# Patient Record
Sex: Female | Born: 1985 | Race: White | Hispanic: No | Marital: Single | Smoking: Current every day smoker
Health system: Southern US, Community
[De-identification: ages and names within clinical notes are randomized; demographics above are authoritative.]

## PROBLEM LIST (undated history)

## (undated) DIAGNOSIS — M1990 Unspecified osteoarthritis, unspecified site: Secondary | ICD-10-CM

## (undated) DIAGNOSIS — S62102A Fracture of unspecified carpal bone, left wrist, initial encounter for closed fracture: Secondary | ICD-10-CM

## (undated) DIAGNOSIS — M069 Rheumatoid arthritis, unspecified: Secondary | ICD-10-CM

## (undated) DIAGNOSIS — E781 Pure hyperglyceridemia: Secondary | ICD-10-CM

## (undated) DIAGNOSIS — M545 Low back pain, unspecified: Secondary | ICD-10-CM

## (undated) DIAGNOSIS — F909 Attention-deficit hyperactivity disorder, unspecified type: Secondary | ICD-10-CM

## (undated) DIAGNOSIS — F5104 Psychophysiologic insomnia: Secondary | ICD-10-CM

## (undated) DIAGNOSIS — J9690 Respiratory failure, unspecified, unspecified whether with hypoxia or hypercapnia: Secondary | ICD-10-CM

## (undated) DIAGNOSIS — R87629 Unspecified abnormal cytological findings in specimens from vagina: Secondary | ICD-10-CM

## (undated) DIAGNOSIS — F32A Depression, unspecified: Secondary | ICD-10-CM

## (undated) DIAGNOSIS — E669 Obesity, unspecified: Secondary | ICD-10-CM

## (undated) DIAGNOSIS — R112 Nausea with vomiting, unspecified: Secondary | ICD-10-CM

## (undated) DIAGNOSIS — E063 Autoimmune thyroiditis: Secondary | ICD-10-CM

## (undated) DIAGNOSIS — N200 Calculus of kidney: Secondary | ICD-10-CM

## (undated) DIAGNOSIS — Z9889 Other specified postprocedural states: Secondary | ICD-10-CM

## (undated) DIAGNOSIS — F329 Major depressive disorder, single episode, unspecified: Secondary | ICD-10-CM

## (undated) DIAGNOSIS — E039 Hypothyroidism, unspecified: Secondary | ICD-10-CM

## (undated) DIAGNOSIS — G4700 Insomnia, unspecified: Secondary | ICD-10-CM

## (undated) DIAGNOSIS — S62101A Fracture of unspecified carpal bone, right wrist, initial encounter for closed fracture: Secondary | ICD-10-CM

## (undated) DIAGNOSIS — F410 Panic disorder [episodic paroxysmal anxiety] without agoraphobia: Secondary | ICD-10-CM

## (undated) DIAGNOSIS — Z8489 Family history of other specified conditions: Secondary | ICD-10-CM

## (undated) DIAGNOSIS — O24419 Gestational diabetes mellitus in pregnancy, unspecified control: Secondary | ICD-10-CM

## (undated) HISTORY — DX: Psychophysiologic insomnia: F51.04

## (undated) HISTORY — DX: Panic disorder (episodic paroxysmal anxiety): F41.0

## (undated) HISTORY — DX: Major depressive disorder, single episode, unspecified: F32.9

## (undated) HISTORY — DX: Attention-deficit hyperactivity disorder, unspecified type: F90.9

## (undated) HISTORY — PX: ADENOIDECTOMY: SUR15

## (undated) HISTORY — PX: NOSE SURGERY: SHX723

## (undated) HISTORY — DX: Rheumatoid arthritis, unspecified: M06.9

## (undated) HISTORY — DX: Calculus of kidney: N20.0

## (undated) HISTORY — PX: WRIST FRACTURE SURGERY: SHX121

## (undated) HISTORY — PX: TONSILLECTOMY AND ADENOIDECTOMY: SUR1326

## (undated) HISTORY — DX: Autoimmune thyroiditis: E06.3

## (undated) HISTORY — DX: Unspecified abnormal cytological findings in specimens from vagina: R87.629

## (undated) HISTORY — DX: Depression, unspecified: F32.A

---

## 2003-01-27 ENCOUNTER — Other Ambulatory Visit: Admission: RE | Admit: 2003-01-27 | Discharge: 2003-01-27 | Payer: Self-pay | Admitting: Obstetrics and Gynecology

## 2004-06-05 ENCOUNTER — Emergency Department (HOSPITAL_COMMUNITY): Admission: EM | Admit: 2004-06-05 | Discharge: 2004-06-05 | Payer: Self-pay | Admitting: Family Medicine

## 2004-07-24 ENCOUNTER — Other Ambulatory Visit: Admission: RE | Admit: 2004-07-24 | Discharge: 2004-07-24 | Payer: Self-pay | Admitting: Obstetrics and Gynecology

## 2009-02-06 ENCOUNTER — Observation Stay (HOSPITAL_COMMUNITY): Admission: AC | Admit: 2009-02-06 | Discharge: 2009-02-07 | Payer: Self-pay

## 2009-02-06 DIAGNOSIS — J9690 Respiratory failure, unspecified, unspecified whether with hypoxia or hypercapnia: Secondary | ICD-10-CM

## 2009-02-06 HISTORY — DX: Respiratory failure, unspecified, unspecified whether with hypoxia or hypercapnia: J96.90

## 2009-03-28 HISTORY — PX: ORIF METACARPAL FRACTURE: SUR940

## 2010-03-28 ENCOUNTER — Ambulatory Visit (HOSPITAL_COMMUNITY): Admission: RE | Admit: 2010-03-28 | Discharge: 2010-03-28 | Payer: Self-pay | Admitting: Obstetrics and Gynecology

## 2010-09-07 LAB — CBC
HCT: 45.5 % (ref 36.0–46.0)
Hemoglobin: 15.4 g/dL — ABNORMAL HIGH (ref 12.0–15.0)
MCV: 91.8 fL (ref 78.0–100.0)
WBC: 15.3 10*3/uL — ABNORMAL HIGH (ref 4.0–10.5)

## 2010-09-07 LAB — BASIC METABOLIC PANEL
GFR calc Af Amer: >60 mL/min (ref >60)
GFR calc non Af Amer: >60 mL/min (ref >60)
Potassium: 3.5 mEq/L (ref 3.5–5.1)
Sodium: 140 mEq/L (ref 135–145)

## 2010-09-07 LAB — ABO/RH: ABO/RH(D): O POS

## 2010-09-07 LAB — POCT I-STAT, CHEM 8
Chloride: 108 meq/L (ref 96–112)
HCT: 49 % — ABNORMAL HIGH (ref 36.0–46.0)
Potassium: 3 meq/L — ABNORMAL LOW (ref 3.5–5.1)

## 2010-09-07 LAB — TYPE AND SCREEN: ABO/RH(D): O POS

## 2010-09-07 LAB — ETHANOL: Alcohol, Ethyl (B): 227 mg/dL — ABNORMAL HIGH (ref 0–10)

## 2010-09-07 LAB — PROTIME-INR: INR: 0.9 (ref 0.00–1.49)

## 2010-09-07 LAB — POCT PREGNANCY, URINE: Preg Test, Ur: NEGATIVE

## 2010-09-19 ENCOUNTER — Ambulatory Visit (INDEPENDENT_AMBULATORY_CARE_PROVIDER_SITE_OTHER): Payer: 59

## 2010-09-19 ENCOUNTER — Inpatient Hospital Stay (INDEPENDENT_AMBULATORY_CARE_PROVIDER_SITE_OTHER)
Admission: RE | Admit: 2010-09-19 | Discharge: 2010-09-19 | Disposition: A | Discharge status: Home / Self Care | Payer: 59 | Source: Ambulatory Visit | Attending: Emergency Medicine | Admitting: Emergency Medicine

## 2010-09-19 DIAGNOSIS — S52599A Other fractures of lower end of unspecified radius, initial encounter for closed fracture: Secondary | ICD-10-CM

## 2011-06-12 ENCOUNTER — Other Ambulatory Visit: Payer: Self-pay | Admitting: Family Medicine

## 2011-06-12 ENCOUNTER — Ambulatory Visit
Admission: RE | Admit: 2011-06-12 | Discharge: 2011-06-12 | Disposition: A | Discharge status: Home / Self Care | Payer: 59 | Source: Ambulatory Visit | Attending: Family Medicine | Admitting: Family Medicine

## 2011-06-12 DIAGNOSIS — M549 Dorsalgia, unspecified: Secondary | ICD-10-CM

## 2011-06-12 DIAGNOSIS — M542 Cervicalgia: Secondary | ICD-10-CM

## 2012-08-21 ENCOUNTER — Telehealth: Payer: Self-pay | Admitting: Physician Assistant

## 2012-08-21 DIAGNOSIS — M62838 Other muscle spasm: Secondary | ICD-10-CM

## 2012-08-21 MED ORDER — CYCLOBENZAPRINE HCL 10 MG PO TABS: 10.0000 mg | ORAL_TABLET | Freq: Three times a day (TID) | ORAL | Status: DC | PRN

## 2012-08-21 NOTE — Telephone Encounter (Signed)
Medication refilled per protocol. 

## 2012-09-28 ENCOUNTER — Telehealth: Payer: Self-pay | Admitting: Physician Assistant

## 2012-09-28 DIAGNOSIS — M62838 Other muscle spasm: Secondary | ICD-10-CM

## 2012-09-28 MED ORDER — CYCLOBENZAPRINE HCL 10 MG PO TABS: 10.0000 mg | ORAL_TABLET | Freq: Three times a day (TID) | ORAL | Status: DC | PRN

## 2012-09-28 NOTE — Telephone Encounter (Signed)
Medication refilled per protocol. 

## 2012-11-23 ENCOUNTER — Other Ambulatory Visit: Payer: Self-pay | Admitting: Physician Assistant

## 2012-11-23 NOTE — Telephone Encounter (Signed)
Medication refilled per protocol.Patient needs to be seen before any further refills 

## 2013-01-26 ENCOUNTER — Ambulatory Visit: Payer: Self-pay | Admitting: Family Medicine

## 2013-01-29 ENCOUNTER — Encounter: Payer: Self-pay | Admitting: Family Medicine

## 2013-01-29 ENCOUNTER — Ambulatory Visit (INDEPENDENT_AMBULATORY_CARE_PROVIDER_SITE_OTHER): Payer: 59 | Admitting: Family Medicine

## 2013-01-29 VITALS — BP 130/80 | HR 68 | Temp 97.9°F | Resp 16 | Wt 197.0 lb

## 2013-01-29 DIAGNOSIS — M545 Low back pain, unspecified: Secondary | ICD-10-CM

## 2013-01-29 MED ORDER — NAPROXEN 500 MG PO TABS: 500.0000 mg | ORAL_TABLET | Freq: Two times a day (BID) | ORAL | Status: DC

## 2013-01-29 MED ORDER — TRAMADOL HCL 50 MG PO TABS: 50.0000 mg | ORAL_TABLET | Freq: Four times a day (QID) | ORAL | Status: DC | PRN

## 2013-01-29 MED ORDER — CYCLOBENZAPRINE HCL 10 MG PO TABS: ORAL_TABLET | ORAL | Status: DC

## 2013-01-29 NOTE — Patient Instructions (Signed)
Start naprosyn twice a day Use heating pad Ultram at bedtime Okay to use flexeril Try massage Xray of L spine ,get done at work F/U as needed

## 2013-02-01 ENCOUNTER — Ambulatory Visit (HOSPITAL_COMMUNITY)
Admission: RE | Admit: 2013-02-01 | Discharge: 2013-02-01 | Disposition: A | Discharge status: Home / Self Care | Payer: 59 | Source: Ambulatory Visit | Attending: Family Medicine | Admitting: Family Medicine

## 2013-02-01 ENCOUNTER — Encounter: Payer: Self-pay | Admitting: Family Medicine

## 2013-02-01 DIAGNOSIS — M545 Low back pain, unspecified: Secondary | ICD-10-CM

## 2013-02-01 DIAGNOSIS — M549 Dorsalgia, unspecified: Secondary | ICD-10-CM | POA: Insufficient documentation

## 2013-02-01 HISTORY — DX: Low back pain, unspecified: M54.50

## 2013-02-01 NOTE — Progress Notes (Signed)
  Subjective:    Patient ID: Quenton Fetter, female    DOB: [birth year], 27 y.o.   MRN: [id number]  HPI  Pt here with low back pain worsening for the past month. Has history of neck spasm and uses flexeril for this. 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 Back pain worse at end of day when shift is over. Unable to sleep due to pain. Denies any radiating pain, change in bowel or bladder or paresthesia. Has been taking tylenol and ibuprofen with little relief. Denies UTI symptoms. No specific injury   Review of Systems  GEN- denies fatigue, fever, weight loss,weakness, recent illness ABD- denies N/V, change in stools, abd pain GU- denies dysuria, hematuria, dribbling, incontinence MSK- + joint pain, muscle aches, injury Neuro- denies headache, dizziness, syncope, seizure activity      Objective:   Physical Exam GEN- NAD, alert and oriented x3 Neck- Supple, FROM Back- Mild TTP lumbar spine, +paraspinal spasm, Good ROM, neg SLR, able to walk on toes MSK- Hip- FROM bilat, able to squat  NEURO- CNII-XII intact, sensation and motor in tact bilat, equal bilat, DTR symmetric EXT- No edema Pulses- Radial, DP- 2+        Assessment & Plan:

## 2013-02-01 NOTE — Assessment & Plan Note (Signed)
I think this MSK, with her job and activities, exam benign Continue muscle relaxer Will give Naprosyn BID with food Ultram prn severe pain at bedtime Xray of back to be done Also recommended heating pad, massage

## 2013-03-10 ENCOUNTER — Telehealth: Payer: Self-pay | Admitting: Family Medicine

## 2013-04-23 ENCOUNTER — Ambulatory Visit (INDEPENDENT_AMBULATORY_CARE_PROVIDER_SITE_OTHER): Payer: 59 | Admitting: Family Medicine

## 2013-04-23 VITALS — BP 120/86 | HR 78 | Temp 98.4°F | Resp 18 | Ht 66.0 in | Wt 203.0 lb

## 2013-04-23 DIAGNOSIS — M79609 Pain in unspecified limb: Secondary | ICD-10-CM

## 2013-04-23 DIAGNOSIS — M79671 Pain in right foot: Secondary | ICD-10-CM

## 2013-04-23 MED ORDER — CYCLOBENZAPRINE HCL 10 MG PO TABS: ORAL_TABLET | ORAL | Status: DC

## 2013-04-23 MED ORDER — TRAMADOL HCL 50 MG PO TABS: 50.0000 mg | ORAL_TABLET | Freq: Four times a day (QID) | ORAL | Status: DC | PRN

## 2013-04-23 NOTE — Patient Instructions (Signed)
COntinue current medications Use the naprosyn twice a day F/U as needed

## 2013-04-24 ENCOUNTER — Encounter: Payer: Self-pay | Admitting: Family Medicine

## 2013-04-24 DIAGNOSIS — M79671 Pain in right foot: Secondary | ICD-10-CM

## 2013-04-24 HISTORY — DX: Pain in right foot: M79.671

## 2013-04-24 NOTE — Progress Notes (Signed)
  Subjective:    Patient ID: Sonia Holden, female    DOB: 08/12/1985, 27 y.o.   MRN: [id number]  HPI   patient here with right foot pain for the past week. She states Friday night after working 12 hour shift she began having pain at her metatarsal arch. She denies any specific injury. She had some mild swelling on the top of her foot that resolved quickly. She is walking but has not seen any change due to exercise. 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 She did miss 2 days of work secondary to foot pain. She request refill on pain meds   Review of Systems - per above  GEN- denies fatigue, fever, weight loss,weakness, recent illness MSK- + joint pain, muscle aches, injury Neuro- denies headache, dizziness, syncope, seizure activity       Objective:   Physical Exam  GEN-NAD,alert and oriented x 3 Ext- no edema, pulse- DP 2+ MSK- FROM bilat ankles, normal inspection ankle and feet, RIght foot, mild TTP at metarsal arch, no pain or tenderness at plantar fascia insertion, no heel pain, no bone spur felt, normal GAIT      Assessment & Plan:

## 2013-04-24 NOTE — Assessment & Plan Note (Signed)
It is mostly at her arch. Advised to use arch support. This does not fit plantar fasciitis at this time. She can also take anti-inflammatories as needed. I refilled her medications for her chronic back pain

## 2013-07-07 ENCOUNTER — Telehealth: Payer: Self-pay | Admitting: Family Medicine

## 2013-07-07 MED ORDER — TRAMADOL HCL 50 MG PO TABS: 50.0000 mg | ORAL_TABLET | Freq: Four times a day (QID) | ORAL | Status: DC | PRN

## 2013-07-07 NOTE — Telephone Encounter (Signed)
Ok to refill 

## 2013-07-07 NOTE — Telephone Encounter (Signed)
Okay to refill? 

## 2013-07-07 NOTE — Telephone Encounter (Signed)
Med phoned in °

## 2013-07-07 NOTE — Telephone Encounter (Signed)
Needs Ultram Rx-  She has lost her original written one.  Garza

## 2013-10-21 ENCOUNTER — Other Ambulatory Visit: Payer: Self-pay | Admitting: Family Medicine

## 2013-10-21 NOTE — Telephone Encounter (Signed)
Ok to refill??  Last office visit 04/23/2013.  Last refill 04/26/2013.

## 2014-01-31 ENCOUNTER — Other Ambulatory Visit: Payer: Self-pay | Admitting: Family Medicine

## 2014-01-31 NOTE — Telephone Encounter (Signed)
Ok to refill??  Last office visit 04/23/2013.  Last refill 07/07/2013, #1 refill.

## 2014-01-31 NOTE — Telephone Encounter (Signed)
Medication called to pharmacy.  Letter sent.  

## 2014-01-31 NOTE — Telephone Encounter (Signed)
Okay to refill, needs OV before any further future fills since seen last in Nov

## 2014-03-16 ENCOUNTER — Encounter: Payer: Self-pay | Admitting: Family Medicine

## 2014-03-16 ENCOUNTER — Ambulatory Visit (INDEPENDENT_AMBULATORY_CARE_PROVIDER_SITE_OTHER): Payer: 59 | Admitting: Family Medicine

## 2014-03-16 VITALS — BP 128/68 | HR 82 | Temp 98.2°F | Resp 16 | Ht 65.0 in | Wt 199.0 lb

## 2014-03-16 DIAGNOSIS — F419 Anxiety disorder, unspecified: Secondary | ICD-10-CM

## 2014-03-16 DIAGNOSIS — F5105 Insomnia due to other mental disorder: Secondary | ICD-10-CM

## 2014-03-16 DIAGNOSIS — M545 Low back pain, unspecified: Secondary | ICD-10-CM

## 2014-03-16 DIAGNOSIS — G4700 Insomnia, unspecified: Secondary | ICD-10-CM

## 2014-03-16 DIAGNOSIS — F988 Other specified behavioral and emotional disorders with onset usually occurring in childhood and adolescence: Secondary | ICD-10-CM | POA: Insufficient documentation

## 2014-03-16 DIAGNOSIS — F909 Attention-deficit hyperactivity disorder, unspecified type: Secondary | ICD-10-CM

## 2014-03-16 HISTORY — DX: Anxiety disorder, unspecified: F51.05

## 2014-03-16 HISTORY — DX: Insomnia due to other mental disorder: F41.9

## 2014-03-16 MED ORDER — CYCLOBENZAPRINE HCL 10 MG PO TABS: ORAL_TABLET | ORAL | Status: DC

## 2014-03-16 MED ORDER — DOXEPIN HCL 25 MG PO CAPS: ORAL_CAPSULE | ORAL | Status: DC

## 2014-03-16 MED ORDER — TRAMADOL HCL 50 MG PO TABS: ORAL_TABLET | ORAL | Status: DC

## 2014-03-16 NOTE — Assessment & Plan Note (Signed)
Given doxepin 25 mg at bedtime

## 2014-03-16 NOTE — Assessment & Plan Note (Signed)
Chronic low back pain musculoskeletal. I refilled her Flexeril she also uses tramadol during flares. We discussed weight loss and exercise the tone of her back muscles to prevent these injuries

## 2014-03-16 NOTE — Assessment & Plan Note (Signed)
Continue followup by psychiatrist for her Adderall and her Ambien, xanax

## 2014-03-16 NOTE — Patient Instructions (Signed)
Continue current medications F/U as needed  

## 2014-03-16 NOTE — Progress Notes (Signed)
Patient ID: Sonia Holden, female   DOB: [birth year], 28 y.o.   MRN: [id number]   Subjective:    Patient ID: Sonia Holden, female    DOB: 03/03/1986, 28 y.o.   MRN: [id number]  Patient presents for Medication Refills  patient here for medication refills. She still being followed by her psychiatrist for her ADD a chronic insomnia. She was given doxepin 10 mg however she has been taken 3 capsules she states her psychiatrist is aware but she has not been in to get the prescription changed she is requesting a prescription for higher dose of doxepin.  Regarding her back pain she is a very well with her low back pain x-rays were negative for her degenerative disc disease last year however with her physical work she tends to straining her back every now and then uses Flexeril and tramadol as needed. She's currently remodeling her home there for his required the medication this past weekend    Review Of Systems:  GEN- denies fatigue, fever, weight loss,weakness, recent illness HEENT- denies eye drainage, change in vision, nasal discharge, CVS- denies chest pain, palpitations RESP- denies SOB, cough, wheeze ABD- denies N/V, change in stools, abd pain GU- denies dysuria, hematuria, dribbling, incontinence MSK- denies joint pain, muscle aches, injury Neuro- denies headache, dizziness, syncope, seizure activity       Objective:    BP 128/68  Pulse 82  Temp(Src) 98.2 F (36.8 C) (Oral)  Resp 16  Ht 5\' 5"  (1.651 m)  Wt 199 lb (90.266 kg)  BMI 33.12 kg/m2  LMP 03/11/2014 GEN- NAD, alert and oriented x3 HEENT- PERRL, EOMI, non injected sclera, pink conjunctiva, MMM, oropharynx clear CVS- RRR, no murmur RESP-CTAB Psych- normal affect and mood EXT- No edema Pulses- Radial 2+        Assessment & Plan:      Problem List Items Addressed This Visit   None      Note: This dictation was prepared with Dragon dictation along with smaller phrase technology. Any transcriptional  errors that result from this process are unintentional.

## 2014-06-15 ENCOUNTER — Encounter: Payer: Self-pay | Admitting: Internal Medicine

## 2014-06-15 ENCOUNTER — Encounter (INDEPENDENT_AMBULATORY_CARE_PROVIDER_SITE_OTHER): Payer: Self-pay

## 2014-06-15 ENCOUNTER — Ambulatory Visit (INDEPENDENT_AMBULATORY_CARE_PROVIDER_SITE_OTHER): Payer: 59 | Admitting: Internal Medicine

## 2014-06-15 VITALS — BP 112/76 | HR 93 | Temp 98.1°F | Resp 16 | Ht 67.0 in | Wt 208.5 lb

## 2014-06-15 DIAGNOSIS — G4700 Insomnia, unspecified: Secondary | ICD-10-CM

## 2014-06-15 DIAGNOSIS — Z803 Family history of malignant neoplasm of breast: Secondary | ICD-10-CM

## 2014-06-15 DIAGNOSIS — E669 Obesity, unspecified: Secondary | ICD-10-CM

## 2014-06-15 NOTE — Patient Instructions (Addendum)
Please return for fasting labs at yoru earliest convenience   This is my version of a  "Low GI"  Diet:  It will still lower your blood sugars and allow you to lose 4 to 8  lbs  per month if you follow it carefully.  Your goal with exercise is a minimum of 30 minutes of aerobic exercise 5 days per week (Walking does not count once it becomes easy!)    All of the foods can be found at grocery stores and in bulk at [employer].  The Atkins protein bars and shakes are available in more varieties at Target, [employer] and [employer].     7 AM Breakfast:  Choose from the following:  Low carbohydrate Protein  Shakes (I recommend the EAS AdvantEdge "Carb Control" shakes  Or the low carb shakes by Atkins.    2.5 carbs   Arnold's "Sandwhich Thin"toasted  w/ peanut butter (no jelly: about 20 net carbs  "Bagel Thin" with cream cheese and salmon: about 20 carbs   a scrambled egg/bacon/cheese burrito made with Mission's "carb balance" whole wheat tortilla  (about 10 net carbs )   Avoid cereal and bananas, oatmeal and cream of wheat and grits. They are loaded with carbohydrates!   10 AM: high protein snack  Protein bar by Atkins (the snack size, under 200 cal, usually < 6 net carbs).    A stick of cheese:  Around 1 carb,  100 cal     Dannon Light n Fit [place] Yogurt  (80 cal, 8 carbs)  Other so called "protein bars" and Greek yogurts tend to be loaded with carbohydrates.  Remember, in food advertising, the word "energy" is synonymous for " carbohydrate."  Lunch:   A Sandwich using the bread choices listed, Can use any  Eggs,  lunchmeat, grilled meat or canned tuna), avocado, regular mayo/mustard  and cheese.  A Salad using blue cheese, ranch,  Goddess or vinagrette,  No croutons or "confetti" and no "candied nuts" but regular nuts OK.   No pretzels or chips.  Pickles and miniature sweet peppers are a good low carb alternative that provide a "crunch"  The bread is the only source of carbohydrate in a sandwich  and  can be decreased by trying some of these alternatives to traditional loaf bread  Joseph's makes a pita bread and a flat bread that are 50 cal and 4 net carbs available at [place].  This can be toasted to use with hummous as well  Toufayan makes a low carb flatbread that's 100 cal and 9 net carbs available at [employer] and [employer]J's makes 2 sizes of  Low carb whole wheat tortilla  (The large one is 210 cal and 6 net carbs) Avoid "Low fat dressings, as well as Barry Brunner and [place] dressings They are loaded with sugar!   3 PM/ Mid day  Snack:  Consider  1 ounce of  almonds, walnuts, pistachios, pecans, peanuts,  Macadamia nuts or a nut medley.  Avoid "granola"; the dried cranberries and raisins are loaded with carbohydrates. Mixed nuts as long as there are no raisins,  cranberries or dried fruit.    Try the prosciutto/mozzarella cheese sticks by Fiorruci  In deli /backery section   High protein      6 PM  Dinner:     Meat/fowl/fish with a green salad, and either broccoli, cauliflower, green beans, spinach, brussel sprouts or  Lima beans. DO NOT BREAD THE PROTEIN!!  There is a low carb pasta by Dreamfield's that is acceptable and tastes great: only 5 digestible carbs/serving.( All grocery stores but BJs carry it )  Try Hurley Cisco Angelo's chicken piccata or chicken or eggplant parm over low carb pasta.(Lowes and BJs)   Marjory Lies Sanchez's "Carnitas" (pulled pork, no sauce,  0 carbs) or his beef pot roast to make a dinner burrito (at BJ's)  Pesto over low carb pasta (bj's sells a good quality pesto in the center refrigerated section of the deli   Try satueeing  Cheral Marker with mushroooms  Whole wheat pasta is still full of digestible carbs and  Not as low in glycemic index as Dreamfield's.   Brown rice is still rice,  So skip the rice and noodles if you eat [place] or [place] (or at least limit to 1/2 cup)  9 PM snack :   Breyer's "low carb" fudgsicle or  ice cream bar (Carb  Smart line), or  Weight Watcher's ice cream bar , or another "no sugar added" ice cream;  a serving of fresh berries/cherries with whipped cream   Cheese or DANNON'S LlGHT N FIT GREEK YOGURT  8 ounces of [place] unsweetened almond/cococunut milk    Avoid bananas, pineapple, grapes  and watermelon on a regular basis because they are high in sugar.  THINK OF THEM AS DESSERT  Remember that snack Substitutions should be less than 10 NET carbs per serving and meals < 20 carbs. Remember to subtract fiber grams to get the "net carbs."

## 2014-06-15 NOTE — Progress Notes (Signed)
Patient ID: Sonia Holden, female   DOB: 11-19-1985, 29 y.o.   MRN: [id number]   Patient Active Problem List   Diagnosis Date Noted  . Obesity 06/18/2014  . Family history of breast cancer in first degree relative 06/18/2014  . Insomnia 03/16/2014  . ADD (attention deficit disorder) 03/16/2014  . Right foot pain 04/24/2013  . Lumbar back pain 02/01/2013    Subjective:  CC:   Chief Complaint  Patient presents with  . Establish Care    weight concerns wants to lose weight.    HPI:   Sonia Holden Faucetteis a 29 y.o. female who presents to establish primary care.  She has multiple  Chronic issues:  1)  Obesity.  Her heaviest weight has  Been 210 lbs to date.  Her nadir was 135 lbs .  Prior trial of phentermine was helpful but caused increased irritability and anger , and hse is now treated with adderall for ADD  She is not following a diet yet,  And is not involved in a formal exercise regimen because she is renovating her house in her spare time .  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   2) Chronic insomnia: managed with ambien and doxepin  For over a year.    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 No kids.  Currently in a relationship   Medical HX  history of concussion and fractured left hand requiring surgical fixation by Gramig,  2011    Past Medical History  Diagnosis Date  . ADHD (attention deficit hyperactivity disorder)   . Panic attacks   . Chronic insomnia   . Depression    No Known Allergies   Past Surgical History  Procedure Laterality Date  . Adenoidectomy    . Nose surgery      History   Social History  . Marital Status: Single    Spouse Name: N/A    Number of Children: N/A  . Years of Education: N/A   Occupational History  . Not on file.   Social History Main Topics  . Smoking status: Former Smoker -- .5 years    Quit date: 03/15/2014  . Smokeless tobacco: Never Used  . Alcohol Use: 0.0  oz/week    0 Not specified per week     Comment: occassionally   . Drug Use: No  . Sexual Activity: Yes   Other Topics Concern  . Not on file   Social History Narrative   Family History  Problem Relation Age of Onset  . Cancer Mother     breast  . Depression Mother   . Kidney disease Father   . Hyperlipidemia Father   . Arthritis Father        Review of Systems:   The rest of the review of systems was negative except those addressed in the HPI.      Objective:  BP 112/76 mmHg  Pulse 93  Temp(Src) 98.1 F (36.7 C) (Oral)  Resp 16  Ht $R'5\' 7"'HQ$  (1.702 m)  Wt 208 lb 8 oz (94.575 kg)  BMI 32.65 kg/m2  SpO2 98%  LMP 06/05/2014 (Approximate)  General appearance: alert, cooperative and appears stated age Ears: normal TM's and external ear canals both ears Throat: lips, mucosa, and tongue normal; teeth and gums normal Neck: no adenopathy, no carotid bruit, supple, symmetrical, trachea midline and thyroid not enlarged, symmetric, no tenderness/mass/nodules Back: symmetric,  no curvature. ROM normal. No CVA tenderness. Lungs: clear to auscultation bilaterally Heart: regular rate and rhythm, S1, S2 normal, no murmur, click, rub or gallop Abdomen: soft, non-tender; bowel sounds normal; no masses,  no organomegaly Pulses: 2+ and symmetric Skin: Skin color, texture, turgor normal. No rashes or lesions Lymph nodes: Cervical, supraclavicular, and axillary nodes normal.  Assessment and Plan:  Obesity I have addressed  BMI and recommended wt loss of 10% of body weigh over the next 6 months using a low glycemic index diet and regular exercise a minimum of 5 days per week. Phentermine C/i due to prior adverse reaction and concurrent use of Adderall.  Information on belviq given.  Screening for metabolic disrorders advised.     Family history of breast cancer in first degree relative She has a strong FH of breast cancer (mother and maternal aunt) but her mothers BRCA carriage  is unknown; therefore it is unclear whether screening for her should start now and whether breast MRI is warranted.  Will refer patient for genetic testing if she is agreeable.    Insomnia Managed with ambien and doxepin,  No changes today     Updated Medication List Outpatient Encounter Prescriptions as of 06/15/2014  Medication Sig  . ALPRAZolam (XANAX) 1 MG tablet Take 1 mg by mouth 2 (two) times daily as needed for sleep.   Marland Kitchen amphetamine-dextroamphetamine (ADDERALL) 10 MG tablet Take 10 mg by mouth 2 (two) times daily.  . cyclobenzaprine (FLEXERIL) 10 MG tablet TAKE 1 TABLET BY MOUTH 3 TIMES DAILY AS NEEDED FOR MUSCLE SPASMS.  Marland Kitchen doxepin (SINEQUAN) 25 MG capsule 1 capsule at bedtime  . Multiple Vitamin (MULTIVITAMIN) tablet Take 1 tablet by mouth daily.  . naproxen (NAPROSYN) 500 MG tablet Take 1 tablet (500 mg total) by mouth 2 (two) times daily with a meal.  . traMADol (ULTRAM) 50 MG tablet TAKE 1 TABLET BY MOUTH EVERY 6 HOURS AS NEEDED FOR PAIN  . valACYclovir (VALTREX) 500 MG tablet Take 500 mg by mouth 2 (two) times daily.  Marland Kitchen zolpidem (AMBIEN CR) 12.5 MG CR tablet Take 1 tablet by mouth at bedtime as needed.  . [DISCONTINUED] zolpidem (AMBIEN) 10 MG tablet Take 10 mg by mouth at bedtime as needed for sleep.     Orders Placed This Encounter  Procedures  . Comprehensive metabolic panel  . Lipid panel  . Hemoglobin A1c  . TSH    Return in about 2 days (around 06/17/2014).

## 2014-06-15 NOTE — Progress Notes (Signed)
Pre-visit discussion using our clinic review tool. No additional management support is needed unless otherwise documented below in the visit note.  

## 2014-06-18 ENCOUNTER — Encounter: Payer: Self-pay | Admitting: Internal Medicine

## 2014-06-18 DIAGNOSIS — E669 Obesity, unspecified: Secondary | ICD-10-CM

## 2014-06-18 DIAGNOSIS — Z803 Family history of malignant neoplasm of breast: Secondary | ICD-10-CM | POA: Insufficient documentation

## 2014-06-18 HISTORY — DX: Obesity, unspecified: E66.9

## 2014-06-18 NOTE — Assessment & Plan Note (Signed)
She has a strong FH of breast cancer (mother and maternal aunt) but her mothers BRCA carriage is unknown; therefore it is unclear whether screening for her should start now and whether breast MRI is warranted.  Will refer patient for genetic testing if she is agreeable.

## 2014-06-18 NOTE — Assessment & Plan Note (Signed)
Managed with ambien and doxepin,  No changes today 

## 2014-06-18 NOTE — Assessment & Plan Note (Addendum)
I have addressed  BMI and recommended wt loss of 10% of body weigh over the next 6 months using a low glycemic index diet and regular exercise a minimum of 5 days per week. Phentermine C/i due to prior adverse reaction and concurrent use of Adderall.  Information on belviq given.  Screening for metabolic disrorders advised.

## 2014-06-23 ENCOUNTER — Other Ambulatory Visit: Payer: 59

## 2014-06-30 ENCOUNTER — Telehealth: Payer: Self-pay | Admitting: *Deleted

## 2014-06-30 ENCOUNTER — Other Ambulatory Visit (INDEPENDENT_AMBULATORY_CARE_PROVIDER_SITE_OTHER): Payer: 59

## 2014-06-30 DIAGNOSIS — E669 Obesity, unspecified: Secondary | ICD-10-CM

## 2014-06-30 LAB — TSH: TSH: 1.66 u[IU]/mL (ref 0.35–4.50)

## 2014-06-30 LAB — COMPREHENSIVE METABOLIC PANEL
ALK PHOS: 61 U/L (ref 39–117)
ALT: 18 U/L (ref 0–35)
AST: 12 U/L (ref 0–37)
Albumin: 4.3 g/dL (ref 3.5–5.2)
BUN: 10 mg/dL (ref 6–23)
CHLORIDE: 107 meq/L (ref 96–112)
CO2: 22 meq/L (ref 19–32)
CREATININE: 0.49 mg/dL (ref 0.40–1.20)
Calcium: 9 mg/dL (ref 8.4–10.5)
GFR: 156.96 mL/min (ref >60.00)
Glucose, Bld: 87 mg/dL (ref 70–99)
Potassium: 4.6 mEq/L (ref 3.5–5.1)
SODIUM: 139 meq/L (ref 135–145)
Total Bilirubin: 0.3 mg/dL (ref 0.2–1.2)
Total Protein: 6.8 g/dL (ref 6.0–8.3)

## 2014-06-30 LAB — LIPID PANEL
Cholesterol: 178 mg/dL (ref 0–200)
HDL: 49.1 mg/dL
LDL Cholesterol: 102 mg/dL — ABNORMAL HIGH (ref 0–99)
NonHDL: 128.9
Total CHOL/HDL Ratio: 4
Triglycerides: 134 mg/dL (ref 0.0–149.0)
VLDL: 26.8 mg/dL (ref 0.0–40.0)

## 2014-06-30 LAB — HEMOGLOBIN A1C: HEMOGLOBIN A1C: 5.4 % (ref 4.6–6.5)

## 2014-06-30 MED ORDER — LORCASERIN HCL 10 MG PO TABS: 1.0000 | ORAL_TABLET | Freq: Two times a day (BID) | ORAL | Status: DC

## 2014-06-30 NOTE — Telephone Encounter (Signed)
rx printed,  Return in 3 months,   Please remind patient that to continue medication her Minimum wt loss goal is 11 lbs by that time.

## 2014-06-30 NOTE — Telephone Encounter (Signed)
Pt came in for labs and said that you told her that when she was ready to try the [facility] that you would send her a RX, she is wanting to try it now

## 2014-06-30 NOTE — Telephone Encounter (Signed)
Pt notified,  verbalized understanding. 3 month follow up appt scheduled. Rx faxed to [facility]

## 2014-08-10 ENCOUNTER — Telehealth: Payer: Self-pay

## 2014-08-10 MED ORDER — CYCLOBENZAPRINE HCL 10 MG PO TABS: ORAL_TABLET | ORAL | Status: DC

## 2014-08-10 MED ORDER — TRAMADOL HCL 50 MG PO TABS: ORAL_TABLET | ORAL | Status: DC

## 2014-08-10 NOTE — Telephone Encounter (Signed)
Patient stated you have her currently taking Belviq for weight loss but that if the Belviq did not work she could try something else? Plus earlier requesting refill on tramadol and flexeril Ok to fill?

## 2014-08-10 NOTE — Telephone Encounter (Signed)
The patient called and stated she needs a refill on her tramadol and flexeril rx. Thanks!

## 2014-08-10 NOTE — Telephone Encounter (Signed)
We may have discussed Contrava (wellbutrin/naltrexone) but before we made a switch can she be more specific about the Belviiq/ ? What is an intolerance for side effecgts or did it not suppress her appetite?  refll on the tramadol and flexeril in process

## 2014-08-10 NOTE — Telephone Encounter (Signed)
Last refills 03/16/14 ok to fill?

## 2014-08-11 ENCOUNTER — Encounter: Payer: Self-pay | Admitting: *Deleted

## 2014-08-11 ENCOUNTER — Other Ambulatory Visit: Payer: Self-pay | Admitting: *Deleted

## 2014-08-11 MED ORDER — PHENTERMINE HCL 37.5 MG PO TABS: ORAL_TABLET | ORAL | Status: DC

## 2014-08-11 NOTE — Telephone Encounter (Signed)
Patient stated that the discussion was on use of phentermine that she had used in the past for weight loss, the belviq does not curb her appetitive, only helped for about a week.  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 Please advise.

## 2014-08-11 NOTE — Telephone Encounter (Signed)
Phentermine authorized and rx pinted

## 2014-08-11 NOTE — Telephone Encounter (Signed)
Left message for patient to return call to office. 

## 2014-08-12 NOTE — Telephone Encounter (Signed)
Patient notified and voiced understanding.

## 2014-09-27 ENCOUNTER — Ambulatory Visit: Payer: 59 | Admitting: Internal Medicine

## 2014-09-28 ENCOUNTER — Other Ambulatory Visit: Payer: Self-pay | Admitting: Internal Medicine

## 2014-09-28 NOTE — Telephone Encounter (Signed)
Patient requesting refill on doxepin ok to fill? Last visit 06/15/14

## 2014-09-29 MED ORDER — DOXEPIN HCL 25 MG PO CAPS: ORAL_CAPSULE | ORAL | Status: DC

## 2014-09-29 NOTE — Telephone Encounter (Signed)
90 day supply authorized and sent   

## 2014-11-15 ENCOUNTER — Other Ambulatory Visit: Payer: Self-pay | Admitting: *Deleted

## 2014-11-15 MED ORDER — CYCLOBENZAPRINE HCL 10 MG PO TABS: ORAL_TABLET | ORAL | Status: DC

## 2014-11-15 NOTE — Telephone Encounter (Signed)
Ok refill? Last OV 06/15/14

## 2015-03-17 ENCOUNTER — Other Ambulatory Visit: Payer: Self-pay

## 2015-03-17 MED ORDER — CYCLOBENZAPRINE HCL 10 MG PO TABS: ORAL_TABLET | ORAL | Status: DC

## 2015-03-17 NOTE — Telephone Encounter (Signed)
Ok to refill,  Refill sent  

## 2015-03-17 NOTE — Telephone Encounter (Signed)
Please advise refill as patient has not been seen since 06/15/2014

## 2015-06-13 DIAGNOSIS — F329 Major depressive disorder, single episode, unspecified: Secondary | ICD-10-CM | POA: Diagnosis not present

## 2015-06-13 DIAGNOSIS — F4000 Agoraphobia, unspecified: Secondary | ICD-10-CM | POA: Diagnosis not present

## 2015-06-13 DIAGNOSIS — F919 Conduct disorder, unspecified: Secondary | ICD-10-CM | POA: Diagnosis not present

## 2015-06-21 DIAGNOSIS — R87610 Atypical squamous cells of undetermined significance on cytologic smear of cervix (ASC-US): Secondary | ICD-10-CM | POA: Diagnosis not present

## 2015-06-21 DIAGNOSIS — N870 Mild cervical dysplasia: Secondary | ICD-10-CM | POA: Diagnosis not present

## 2015-06-27 MED FILL — CYCLOBENZAPRINE 10 MG TAB: 10 | 15 days supply | Qty: 45 | Fill #2

## 2015-07-12 ENCOUNTER — Other Ambulatory Visit: Payer: Self-pay

## 2015-07-12 NOTE — Telephone Encounter (Signed)
Please advise, Last OV was 06/15/2014.

## 2015-07-13 NOTE — Telephone Encounter (Signed)
Refills denied.  Needs to have OV

## 2015-07-19 ENCOUNTER — Other Ambulatory Visit: Payer: Self-pay | Admitting: Internal Medicine

## 2015-07-19 NOTE — Telephone Encounter (Signed)
Patient needs a OV for any refills.  We have sent that to the pharmacy numerous times in the past week.  Can you please schedule visit with Dr. Derrel Nip.  Thanks

## 2015-07-19 NOTE — Telephone Encounter (Signed)
Pt called about needing a refill for traMADol (ULTRAM) 50 MG tablet and doxepin (SINEQUAN) 25 MG capsule. 23month supply. Pharmacy is [facility] - [place], [place]. Call pt @ [phone]. Pt has an appt scheduled for 07/24/2015 @ 6pm. Thank you!

## 2015-07-20 MED ORDER — TRAMADOL HCL 50 MG PO TABS: ORAL_TABLET | ORAL | Status: DC

## 2015-07-20 MED ORDER — DOXEPIN HCL 25 MG PO CAPS: ORAL_CAPSULE | ORAL | Status: DC

## 2015-07-20 MED FILL — DOXEPIN 25 MG CAPSULE: 25 | 90 days supply | Qty: 90 | Fill #0

## 2015-07-20 NOTE — Telephone Encounter (Signed)
Patient has OV scheduled now, please advise for refill. Thanks

## 2015-07-20 NOTE — Telephone Encounter (Signed)
No,  i will refill .

## 2015-07-20 NOTE — Telephone Encounter (Signed)
Good morning!!  Sonia Holden pt has a OV on 07/24/2015 @6pm . Does she need to come to visit first before refill?

## 2015-07-21 MED FILL — traMADol HCL 50 MG TABS: 50 | 8 days supply | Qty: 30 | Fill #0

## 2015-07-24 ENCOUNTER — Ambulatory Visit (INDEPENDENT_AMBULATORY_CARE_PROVIDER_SITE_OTHER): Payer: 59 | Admitting: Internal Medicine

## 2015-07-24 ENCOUNTER — Encounter: Payer: Self-pay | Admitting: Internal Medicine

## 2015-07-24 VITALS — BP 118/76 | HR 112 | Temp 97.8°F | Resp 14 | Ht 67.0 in | Wt 229.2 lb

## 2015-07-24 DIAGNOSIS — M545 Low back pain, unspecified: Secondary | ICD-10-CM

## 2015-07-24 DIAGNOSIS — E669 Obesity, unspecified: Secondary | ICD-10-CM | POA: Diagnosis not present

## 2015-07-24 DIAGNOSIS — M62830 Muscle spasm of back: Secondary | ICD-10-CM

## 2015-07-24 DIAGNOSIS — G4700 Insomnia, unspecified: Secondary | ICD-10-CM | POA: Diagnosis not present

## 2015-07-24 MED ORDER — SUVOREXANT 20 MG PO TABS: 1.0000 | ORAL_TABLET | Freq: Every day | ORAL | Status: DC

## 2015-07-24 MED ORDER — ALPRAZOLAM 1 MG PO TABS: 1.0000 mg | ORAL_TABLET | Freq: Two times a day (BID) | ORAL | Status: DC | PRN

## 2015-07-24 MED ORDER — CYCLOBENZAPRINE HCL 10 MG PO TABS: ORAL_TABLET | ORAL | Status: DC

## 2015-07-24 NOTE — Progress Notes (Signed)
Pre visit review using our clinic review tool, if applicable. No additional management support is needed unless otherwise documented below in the visit note. 

## 2015-07-24 NOTE — Patient Instructions (Signed)
This is my  example of a  "Low GI"  Diet:  It will allow you to lose 4 to 8  lbs  per month if you follow it carefully.  Your goal with exercise is a minimum of 30 minutes of aerobic exercise 5 days per week (Walking does not count once it becomes easy!)    All of the foods can be found at grocery stores and in bulk at [employer].  The Atkins protein bars and shakes are available in more varieties at [employer], [employer] and [place].     7 AM Breakfast:  Choose from the following:  Low carbohydrate Protein  Shakes (I recommend the  Premier Protein chocolate shake, s EAS AdvantEdge "Carb Control" shakes  Or the low carb shakes by Atkins.    2.5 carbs)   Arnold's "Sandwhich Thin"toasted  w/ peanut butter (no jelly: about 20 net carbs  "Bagel Thin" with cream cheese and salmon: about 20 carbs   a scrambled egg/bacon/cheese burrito made with Mission's "carb balance" whole wheat tortilla  (about 10 net carbs )  Regulatory affairs officer (basically a quiche without the pastry crust) that is eaten cold and very convenient way to get your eggs  If you make your own shakes, avoid bananas and pineapple,  And use low carb greek yogurt or almond milk    Avoid cereal and bananas, oatmeal and cream of wheat and grits. They are loaded with carbohydrates!   10 AM: high protein snack:  Protein bar by Atkins (the snack size, under 200 cal, usually < 6 net carbs).    A stick of cheese:  Around 1 carb,  100 cal     Dannon Light n Fit [place] Yogurt  (80 cal, 8 carbs)  Other so called "protein bars" and Greek yogurts tend to be loaded with carbohydrates.  Remember, in food advertising, the word "energy" is synonymous for " carbohydrate."  Lunch:   A Sandwich using the bread choices listed, Can use any  Eggs,  lunchmeat, grilled meat or canned tuna), avocado, regular mayo/mustard  and cheese.  A Salad using blue cheese, ranch,  Goddess or vinagrette,  Avoid taco shells, croutons or "confetti" and no  "candied nuts" but regular nuts OK.   No pretzels, nabs  or chips.  Pickles and miniature sweet peppers are a good low carb alternative that provide a "crunch"  The bread is the only source of carbohydrate in a sandwich and  can be decreased by trying some of these alternatives to traditional loaf bread  Joseph's makes a pita bread and a flat bread that are 50 cal and 4 net carbs available at [place] and [place].  This can be toasted to use with hummous as well  Toufayan makes a low carb flatbread that's 100 cal and 9 net carbs available at [employer] and [employer]J's makes 2 sizes of  Low carb whole wheat tortilla  (The large one is 210 cal and 6 net carbs)  Ezekiel bread is a loaf bread sold in the frozen section of higher end grocery chains,  Very low carb  Avoid "Low fat dressings, as well as Barry Brunner and [place] dressings They are loaded with sugar!   3 PM/ Mid day  Snack:  Consider  1 ounce of  almonds, walnuts, pistachios, pecans, peanuts,  Macadamia nuts or a nut medley.  Avoid "granola"; the dried cranberries and raisins are loaded with carbohydrates. Mixed nuts as long as there  are no raisins,  cranberries or dried fruit.    Try the prosciutto/mozzarella cheese sticks by Fiorruci  In deli /backery section   High protein      6 PM  Dinner:     Meat/fowl/fish with a green salad, and either broccoli, cauliflower, green beans, spinach, brussel sprouts or  Lima beans. DO NOT BREAD THE PROTEIN!!      There is a low carb pasta by Dreamfield's that is acceptable and tastes great: only 5 digestible carbs/serving.( All grocery stores but BJs carry it )  Try Hurley Cisco Angelo's chicken piccata or chicken or eggplant parm over low carb pasta.(Lowes and BJs)   Marjory Lies Sanchez's "Carnitas" (pulled pork, no sauce,  0 carbs) or his beef pot roast to make a dinner burrito (at BJ's)  Pesto over low carb pasta (bj's sells a good quality pesto in the center refrigerated section of the deli   Try satueeing   Cheral Marker with mushroooms, garlic and ginger  Whole wheat pasta is still full of digestible carbs and  Not as low in glycemic index as Dreamfield's.   Brown rice is still rice,  So skip the rice and noodles if you eat [place] or [place] (or at least limit to 1/2 cup)  9 PM snack :   Breyer's "low carb" fudgsicle or  ice cream bar (Carb Smart line), or  Weight Watcher's ice cream bar , or another "no sugar added" ice cream;  a serving of fresh berries/cherries with whipped cream   Cheese or DANNON'S LlGHT N FIT GREEK YOGURT  8 ounces of [place] unsweetened almond/cococunut milk    Treat yourself to a parfait made with whipped cream blueberiies, walnuts and vanilla greek yogurt  Avoid bananas, pineapple, grapes  and watermelon on a regular basis because they are high in sugar.  THINK OF THEM AS DESSERT  Remember that snack Substitutions should be less than 10 NET carbs per serving and meals < 20 carbs. Remember to subtract fiber grams to get the "net carbs."

## 2015-07-24 NOTE — Progress Notes (Signed)
Subjective:  Patient ID: Sonia Holden, female    DOB: Aug 01, 1985  Age: 30 y.o. MRN: [id number]  CC: The primary encounter diagnosis was Muscle spasm of back. Diagnoses of Obesity, Insomnia, and Bilateral low back pain without sciatica were also pertinent to this visit.  HPI Sonia Holden presents for follow up on on chronic conditions,  , last seen January 2016.   chronic insomnia managed with ambien and doxepin .  Her psychiatrist has added Belsomra. She is in the process of transferring to another psychiatrist due to insurance  Changes,  And is requesting a refill fro one month until she can get in to see her new provider.   Obesity:  Weight gain since adding Implanon of 21 lbs.  Wants to resume the low GI diet. Wants  to use the [facility] for exercise and massage   Outpatient Prescriptions Prior to Visit  Medication Sig Dispense Refill  . amphetamine-dextroamphetamine (ADDERALL) 10 MG tablet Take 10 mg by mouth 2 (two) times daily.    Marland Kitchen doxepin (SINEQUAN) 25 MG capsule 1 capsule at bedtime 90 capsule 1  . Multiple Vitamin (MULTIVITAMIN) tablet Take 1 tablet by mouth daily.    . naproxen (NAPROSYN) 500 MG tablet Take 1 tablet (500 mg total) by mouth 2 (two) times daily with a meal. 60 tablet 2  . traMADol (ULTRAM) 50 MG tablet TAKE 1 TABLET BY MOUTH EVERY 6 HOURS AS NEEDED FOR PAIN 30 tablet 2  . valACYclovir (VALTREX) 500 MG tablet Take 500 mg by mouth 2 (two) times daily.    Marland Kitchen zolpidem (AMBIEN CR) 12.5 MG CR tablet Take 1 tablet by mouth at bedtime as needed.  5  . ALPRAZolam (XANAX) 1 MG tablet Take 1 mg by mouth 2 (two) times daily as needed for sleep.     . cyclobenzaprine (FLEXERIL) 10 MG tablet TAKE 1 TABLET BY MOUTH 3 TIMES DAILY AS NEEDED FOR MUSCLE SPASMS. 45 tablet 2  . phentermine (ADIPEX-P) 37.5 MG tablet 1/2 tablet in the am and early afternoon (Patient not taking: Reported on 07/24/2015) 30 tablet 2   No facility-administered medications prior to  visit.    Review of Systems;  Patient denies headache, fevers, malaise, unintentional weight loss, skin rash, eye pain, sinus congestion and sinus pain, sore throat, dysphagia,  hemoptysis , cough, dyspnea, wheezing, chest pain, palpitations, orthopnea, edema, abdominal pain, nausea, melena, diarrhea, constipation, flank pain, dysuria, hematuria, urinary  Frequency, nocturia, numbness, tingling, seizures,  Focal weakness, Loss of consciousness,  Tremor,  depression, anxiety, and suicidal ideation.      Objective:  BP 118/76 mmHg  Pulse 112  Temp(Src) 97.8 F (36.6 C) (Oral)  Resp 14  Ht 5\' 7"  (1.702 m)  Wt 229 lb 3.2 oz (103.964 kg)  BMI 35.89 kg/m2  SpO2 98%  LMP 07/10/2015  BP Readings from Last 3 Encounters:  07/24/15 118/76  06/15/14 112/76  03/16/14 128/68    Wt Readings from Last 3 Encounters:  07/24/15 229 lb 3.2 oz (103.964 kg)  06/15/14 208 lb 8 oz (94.575 kg)  03/16/14 199 lb (90.266 kg)    General appearance: alert, cooperative and appears stated age Ears: normal TM's and external ear canals both ears Throat: lips, mucosa, and tongue normal; teeth and gums normal Neck: no adenopathy, no carotid bruit, supple, symmetrical, trachea midline and thyroid not enlarged, symmetric, no tenderness/mass/nodules Back: symmetric, no curvature. ROM normal. No CVA tenderness. Lungs: clear to auscultation bilaterally Heart: regular rate and rhythm,  S1, S2 normal, no murmur, click, rub or gallop Abdomen: soft, non-tender; bowel sounds normal; no masses,  no organomegaly Pulses: 2+ and symmetric Skin: Skin color, texture, turgor normal. No rashes or lesions Lymph nodes: Cervical, supraclavicular, and axillary nodes normal.  Lab Results  Component Value Date   HGBA1C 5.4 06/30/2014    Lab Results  Component Value Date   CREATININE 0.49 06/30/2014   CREATININE 0.52 02/07/2009   CREATININE 0.7 02/06/2009    Lab Results  Component Value Date   WBC 15.3* 02/06/2009    HGB * 02/06/2009    16.7 QA FLAGS MODIFIED BY DEMOGRAPHIC UPDATE ON 09/06 AT 0344   HCT * 02/06/2009    49.0 QA FLAGS MODIFIED BY DEMOGRAPHIC UPDATE ON 09/06 AT 0344   PLT 269 02/06/2009   GLUCOSE 87 06/30/2014   CHOL 178 06/30/2014   TRIG 134.0 06/30/2014   HDL 49.10 06/30/2014   LDLCALC 102* 06/30/2014   ALT 18 06/30/2014   AST 12 06/30/2014   NA 139 06/30/2014   K 4.6 06/30/2014   CL 107 06/30/2014   CREATININE 0.49 06/30/2014   BUN 10 06/30/2014   CO2 22 06/30/2014   TSH 1.66 06/30/2014   INR 0.9 02/06/2009   HGBA1C 5.4 06/30/2014    Dg Lumbar Spine Complete  02/01/2013  *RADIOLOGY REPORT* Clinical Data: Back pain.  No injury. LUMBAR SPINE - COMPLETE 4+ VIEW Comparison: None. Findings: Normal alignment.  No fracture.  No spondylolysis.  No significant degenerative changes. IMPRESSION: Negative lumbar spine study. Original Report Authenticated By: Jorje Guild    Assessment & Plan:   Problem List Items Addressed This Visit    Lumbar back pain    Managed with massage therapy.  Order written for weekly massage       Relevant Medications   cyclobenzaprine (FLEXERIL) 10 MG tablet   Insomnia    Managed with ambein, sinequan and Belsomra.       Obesity    I have addressed  BMI and recommended a low glycemic index diet utilizing smaller more frequent meals to increase metabolism.  I have also recommended that patient start exercising with a goal of 30 minutes of aerobic exercise a minimum of 5 days per week.          Other Visit Diagnoses    Muscle spasm of back    -  Primary    Relevant Orders    Ambulatory referral to Physical Therapy    Ambulatory referral to Physical Therapy       I have discontinued Sonia Holden's phentermine. I have also changed her ALPRAZolam. Additionally, I am having her start on Suvorexant. Lastly, I am having her maintain her amphetamine-dextroamphetamine, multivitamin, naproxen, valACYclovir, zolpidem, doxepin, traMADol, and  cyclobenzaprine.  Meds ordered this encounter  Medications  . ALPRAZolam (XANAX) 1 MG tablet    Sig: Take 1 tablet (1 mg total) by mouth 2 (two) times daily as needed for sleep.    Dispense:  60 tablet    Refill:  0  . cyclobenzaprine (FLEXERIL) 10 MG tablet    Sig: TAKE 1 TABLET BY MOUTH 3 TIMES DAILY AS NEEDED FOR MUSCLE SPASMS.    Dispense:  90 tablet    Refill:  2  . Suvorexant (BELSOMRA) 20 MG TABS    Sig: Take 1 tablet by mouth at bedtime.    Dispense:  30 tablet    Refill:  0    Medications Discontinued During This Encounter  Medication Reason  .  ALPRAZolam (XANAX) 1 MG tablet Reorder  . cyclobenzaprine (FLEXERIL) 10 MG tablet Reorder  . phentermine (ADIPEX-P) 37.5 MG tablet     Follow-up: Return in about 3 months (around 10/21/2015) for fasting labs prior .   Crecencio Mc, MD

## 2015-07-25 MED FILL — ZOLPIDEM TART ER 12.5 MG TA: 12.5 | 30 days supply | Qty: 30 | Fill #0

## 2015-07-25 MED FILL — CYCLOBENZAPRINE 10 MG TAB: 10 | 30 days supply | Qty: 90 | Fill #0

## 2015-07-25 MED FILL — ALPRAZolam 1 MG TABS: 1 | 30 days supply | Qty: 60 | Fill #0

## 2015-07-25 MED FILL — BELSOMRA 20 MG TABLET: 20 | 30 days supply | Qty: 30 | Fill #0

## 2015-07-25 NOTE — Assessment & Plan Note (Signed)
Managed with massage therapy.  Order written for weekly massage

## 2015-07-25 NOTE — Assessment & Plan Note (Signed)
I have addressed  BMI and recommended a low glycemic index diet utilizing smaller more frequent meals to increase metabolism.  I have also recommended that patient start exercising with a goal of 30 minutes of aerobic exercise a minimum of 5 days per week.  

## 2015-07-25 NOTE — Assessment & Plan Note (Signed)
Managed with ambein, sinequan and Belsomra.

## 2015-08-01 DIAGNOSIS — F919 Conduct disorder, unspecified: Secondary | ICD-10-CM | POA: Diagnosis not present

## 2015-08-01 DIAGNOSIS — F4000 Agoraphobia, unspecified: Secondary | ICD-10-CM | POA: Diagnosis not present

## 2015-08-01 DIAGNOSIS — F329 Major depressive disorder, single episode, unspecified: Secondary | ICD-10-CM | POA: Diagnosis not present

## 2015-08-02 MED FILL — lamoTRIgine 25 MG TABS: 25 | 30 days supply | Qty: 60 | Fill #0

## 2015-08-08 ENCOUNTER — Telehealth: Payer: Self-pay | Admitting: Internal Medicine

## 2015-08-08 DIAGNOSIS — R0683 Snoring: Secondary | ICD-10-CM

## 2015-08-08 DIAGNOSIS — M5441 Lumbago with sciatica, right side: Secondary | ICD-10-CM

## 2015-08-08 DIAGNOSIS — E669 Obesity, unspecified: Secondary | ICD-10-CM

## 2015-08-08 DIAGNOSIS — G4700 Insomnia, unspecified: Secondary | ICD-10-CM

## 2015-08-08 NOTE — Telephone Encounter (Signed)
LMOMTCB

## 2015-08-08 NOTE — Telephone Encounter (Signed)
Pt called stating that her psychologist recommends that she get a sleep study and need lab orders for T3,T4 and TSH panel. Please and thank you!

## 2015-08-10 NOTE — Telephone Encounter (Signed)
LMOMTCB

## 2015-08-10 NOTE — Telephone Encounter (Signed)
Pt states that she is requesting an order for a sleep study be ordered. States that her psychologist wants her to have an workup.  She is also requesting orders for labs. Her last TSH was performed in 1/16. Please advise, thanks

## 2015-08-11 NOTE — Telephone Encounter (Signed)
Done ,  Only a TSH is needed for thyroid screening.  Not T4 and T3 ,  Thanks for ordering though

## 2015-08-11 NOTE — Telephone Encounter (Signed)
Called pt and left VM stating orders have been submitted and she can call back and make a lab appt

## 2015-08-22 MED FILL — traMADol HCL 50 MG TABS: 50 | 8 days supply | Qty: 30 | Fill #1

## 2015-08-23 ENCOUNTER — Other Ambulatory Visit: Payer: 59

## 2015-08-29 MED FILL — DOXEPIN 50 MG CAPSULE: 50 | 30 days supply | Qty: 60 | Fill #0

## 2015-08-29 MED FILL — ALPRAZolam 1 MG TABS: 1 | 30 days supply | Qty: 45 | Fill #0

## 2015-08-29 MED FILL — AMPHETAMINE SALTS 10 MG TAB: 10 | 30 days supply | Qty: 60 | Fill #0

## 2015-08-30 ENCOUNTER — Other Ambulatory Visit (INDEPENDENT_AMBULATORY_CARE_PROVIDER_SITE_OTHER): Payer: 59

## 2015-08-30 ENCOUNTER — Telehealth: Payer: Self-pay | Admitting: *Deleted

## 2015-08-30 DIAGNOSIS — G4700 Insomnia, unspecified: Secondary | ICD-10-CM

## 2015-08-30 DIAGNOSIS — E669 Obesity, unspecified: Secondary | ICD-10-CM | POA: Diagnosis not present

## 2015-08-30 LAB — COMPREHENSIVE METABOLIC PANEL
ALT: 22 U/L (ref 0–35)
AST: 21 U/L (ref 0–37)
Albumin: 4.5 g/dL (ref 3.5–5.2)
Alkaline Phosphatase: 52 U/L (ref 39–117)
BUN: 13 mg/dL (ref 6–23)
CHLORIDE: 107 meq/L (ref 96–112)
CO2: 20 meq/L (ref 19–32)
Calcium: 9.5 mg/dL (ref 8.4–10.5)
Creatinine, Ser: 0.56 mg/dL (ref 0.40–1.20)
GFR: 133.53 mL/min (ref >60.00)
GLUCOSE: 82 mg/dL (ref 70–99)
POTASSIUM: 4.2 meq/L (ref 3.5–5.1)
Sodium: 136 mEq/L (ref 135–145)
TOTAL PROTEIN: 7.3 g/dL (ref 6.0–8.3)
Total Bilirubin: 0.3 mg/dL (ref 0.2–1.2)

## 2015-08-30 LAB — TSH: TSH: 1.74 u[IU]/mL (ref 0.35–4.50)

## 2015-08-30 NOTE — Telephone Encounter (Signed)
Denied, I'm the doctor,  And those tests are not necessary

## 2015-08-30 NOTE — Telephone Encounter (Signed)
Pt would like to add t3 and t4

## 2015-08-31 ENCOUNTER — Encounter: Payer: Self-pay | Admitting: Internal Medicine

## 2015-08-31 LAB — LIPID PANEL W/DIRECT LDL/HDL RATIO
Cholesterol: 223 mg/dL — ABNORMAL HIGH (ref 125–200)
HDL: 41 mg/dL — ABNORMAL LOW (ref >=46)
LDL DIRECT: 175 mg/dL — AB (ref <130)
LDL/HDL RATIO (DIRECT LDL): 4.3 ratio
Total Chol/HDL Ratio: 5.4 Ratio — ABNORMAL HIGH (ref <=5.0)
Triglycerides: 115 mg/dL (ref <150)

## 2015-09-28 MED FILL — traMADol HCL 50 MG TABS: 50 | 8 days supply | Qty: 30 | Fill #2

## 2015-09-28 MED FILL — CYCLOBENZAPRINE 10 MG TAB: 10 | 30 days supply | Qty: 90 | Fill #1

## 2015-10-02 MED FILL — DOXEPIN 50 MG CAPSULE: 50 | 30 days supply | Qty: 60 | Fill #1

## 2015-10-02 MED FILL — ALPRAZolam 1 MG TABS: 1 | 30 days supply | Qty: 45 | Fill #1

## 2015-10-11 DIAGNOSIS — F919 Conduct disorder, unspecified: Secondary | ICD-10-CM | POA: Diagnosis not present

## 2015-10-11 DIAGNOSIS — F329 Major depressive disorder, single episode, unspecified: Secondary | ICD-10-CM | POA: Diagnosis not present

## 2015-10-11 DIAGNOSIS — F4000 Agoraphobia, unspecified: Secondary | ICD-10-CM | POA: Diagnosis not present

## 2015-10-25 ENCOUNTER — Ambulatory Visit: Payer: 59 | Attending: Specialist

## 2015-10-25 DIAGNOSIS — G4700 Insomnia, unspecified: Secondary | ICD-10-CM | POA: Insufficient documentation

## 2015-10-25 DIAGNOSIS — Z6835 Body mass index (BMI) 35.0-35.9, adult: Secondary | ICD-10-CM | POA: Diagnosis not present

## 2015-10-25 DIAGNOSIS — R0683 Snoring: Secondary | ICD-10-CM | POA: Insufficient documentation

## 2015-10-25 DIAGNOSIS — G4733 Obstructive sleep apnea (adult) (pediatric): Secondary | ICD-10-CM | POA: Diagnosis not present

## 2015-11-01 ENCOUNTER — Other Ambulatory Visit: Payer: Self-pay

## 2015-11-01 DIAGNOSIS — Z760 Encounter for issue of repeat prescription: Secondary | ICD-10-CM

## 2015-11-01 MED ORDER — TRAMADOL HCL 50 MG PO TABS: ORAL_TABLET | ORAL | Status: DC

## 2015-11-01 MED FILL — ALPRAZolam 1 MG TABS: 1 | 30 days supply | Qty: 45 | Fill #2

## 2015-11-01 MED FILL — CYCLOBENZAPRINE 10 MG TAB: 10 | 30 days supply | Qty: 90 | Fill #2

## 2015-11-01 MED FILL — DEXTROAMP-AMP 10 MG TAB: 10 | 30 days supply | Qty: 60 | Fill #0

## 2015-11-01 MED FILL — DOXEPIN 50 MG CAPSULE: 50 | 30 days supply | Qty: 60 | Fill #2

## 2015-11-01 NOTE — Telephone Encounter (Signed)
Refilled  Please caution not more than 2 daily b/c of other meds she is  taking

## 2015-11-02 MED FILL — traMADol HCL 50 MG TABS: 50 | 15 days supply | Qty: 60 | Fill #0

## 2015-11-02 NOTE — Telephone Encounter (Signed)
Rx faxed

## 2015-11-08 DIAGNOSIS — N909 Noninflammatory disorder of vulva and perineum, unspecified: Secondary | ICD-10-CM | POA: Diagnosis not present

## 2015-11-14 ENCOUNTER — Telehealth: Payer: Self-pay | Admitting: Internal Medicine

## 2015-11-14 DIAGNOSIS — G4700 Insomnia, unspecified: Secondary | ICD-10-CM

## 2015-11-14 NOTE — Telephone Encounter (Signed)
MyChart message sent  Re: normal sleep study

## 2015-12-01 MED FILL — ALPRAZolam 1 MG TABS: 1 | 30 days supply | Qty: 45 | Fill #0

## 2015-12-01 MED FILL — DOXEPIN 50 MG CAPSULE: 50 | 30 days supply | Qty: 60 | Fill #0

## 2015-12-26 DIAGNOSIS — F4000 Agoraphobia, unspecified: Secondary | ICD-10-CM | POA: Diagnosis not present

## 2015-12-26 DIAGNOSIS — F329 Major depressive disorder, single episode, unspecified: Secondary | ICD-10-CM | POA: Diagnosis not present

## 2015-12-26 DIAGNOSIS — F919 Conduct disorder, unspecified: Secondary | ICD-10-CM | POA: Diagnosis not present

## 2015-12-27 ENCOUNTER — Other Ambulatory Visit: Payer: Self-pay

## 2015-12-27 MED ORDER — CYCLOBENZAPRINE HCL 10 MG PO TABS: ORAL_TABLET | ORAL | 2 refills | Status: DC

## 2015-12-27 MED FILL — traMADol HCL 50 MG TABS: 50 | 15 days supply | Qty: 60 | Fill #1

## 2015-12-27 NOTE — Telephone Encounter (Signed)
Please advise refill, thanks 

## 2015-12-27 NOTE — Telephone Encounter (Signed)
In the future it would be helpful if you told me when patient was last seen and when last refill was  Made.    Thanks  Dr. Derrel Nip

## 2015-12-28 MED FILL — DEXTROAMP-AMP 10 MG TAB: 10 | 30 days supply | Qty: 60 | Fill #0

## 2015-12-28 MED FILL — CYCLOBENZAPRINE 10 MG TAB: 10 | 30 days supply | Qty: 90 | Fill #0

## 2015-12-28 MED FILL — DOXEPIN 75 MG CAPSULE: 75 | 30 days supply | Qty: 60 | Fill #0

## 2016-01-01 MED FILL — ALPRAZolam 1 MG TABS: 1 | 30 days supply | Qty: 45 | Fill #1

## 2016-01-31 MED FILL — CYCLOBENZAPRINE 10 MG TAB: 10 | 30 days supply | Qty: 90 | Fill #1

## 2016-01-31 MED FILL — traMADol HCL 50 MG TABS: 50 | 15 days supply | Qty: 60 | Fill #2

## 2016-01-31 MED FILL — ALPRAZolam 1 MG TABS: 1 | 30 days supply | Qty: 45 | Fill #2

## 2016-01-31 MED FILL — DOXEPIN 75 MG CAPSULE: 75 | 30 days supply | Qty: 60 | Fill #1

## 2016-02-09 DIAGNOSIS — R87610 Atypical squamous cells of undetermined significance on cytologic smear of cervix (ASC-US): Secondary | ICD-10-CM | POA: Diagnosis not present

## 2016-02-15 MED FILL — DEXTROAMP-AMP 10 MG TAB: 10 | 30 days supply | Qty: 60 | Fill #0

## 2016-03-01 MED FILL — CYCLOBENZAPRINE 10 MG TAB: 10 | 30 days supply | Qty: 90 | Fill #2

## 2016-03-01 MED FILL — DOXEPIN 75 MG CAPSULE: 75 | 30 days supply | Qty: 60 | Fill #2

## 2016-03-01 MED FILL — ALPRAZolam 1 MG TABS: 1 | 30 days supply | Qty: 45 | Fill #0

## 2016-03-25 ENCOUNTER — Telehealth: Payer: Self-pay | Admitting: Internal Medicine

## 2016-03-25 DIAGNOSIS — Z760 Encounter for issue of repeat prescription: Secondary | ICD-10-CM

## 2016-03-25 MED ORDER — TRAMADOL HCL 50 MG PO TABS: ORAL_TABLET | ORAL | 2 refills | Status: DC

## 2016-03-25 NOTE — Telephone Encounter (Signed)
Last OV 07/24/15 ok  To fill tramadol?

## 2016-03-25 NOTE — Telephone Encounter (Signed)
Pt called requesting a refill on traMADol (ULTRAM) 50 MG tablet.  [place], [name] - [address] [facility].  Call pt @ [phone]

## 2016-03-26 MED FILL — traMADol HCL 50 MG TABS: 50 | 15 days supply | Qty: 60 | Fill #0

## 2016-03-26 NOTE — Telephone Encounter (Signed)
Script faxed.

## 2016-04-01 MED FILL — ALPRAZolam 1 MG TABS: 1 | 30 days supply | Qty: 45 | Fill #1

## 2016-04-02 DIAGNOSIS — F4000 Agoraphobia, unspecified: Secondary | ICD-10-CM | POA: Diagnosis not present

## 2016-04-02 DIAGNOSIS — F919 Conduct disorder, unspecified: Secondary | ICD-10-CM | POA: Diagnosis not present

## 2016-04-02 DIAGNOSIS — F329 Major depressive disorder, single episode, unspecified: Secondary | ICD-10-CM | POA: Diagnosis not present

## 2016-04-02 MED FILL — DEXTROAMP-AMP 10 MG TAB: 10 | 30 days supply | Qty: 60 | Fill #0

## 2016-04-02 MED FILL — ZOLPIDEM TART ER 12.5 MG TA: 12.5 | 30 days supply | Qty: 30 | Fill #0

## 2016-04-17 ENCOUNTER — Telehealth: Payer: Self-pay | Admitting: Internal Medicine

## 2016-04-17 NOTE — Telephone Encounter (Signed)
[facility]onia Holden[facility], patient counselor gave her a choice to of medication because she felt she is being over medicated , patient chose to keep alprazolam for anxiety. Patient misunderstood that she would be losing the Doxepin and the Belsomra, patient feels she is having withdrawal because she was stopped abruptly. Patient is asking PCP to fill Doxepin.

## 2016-04-17 NOTE — Telephone Encounter (Signed)
Ok to refill the doxepin  for 30 days .  Patient  is supposed to be seeing a psychiatrist I had only refilled them as a courtesy per February notes,  Why hasn't she established with a psychiatrist?

## 2016-04-17 NOTE — Telephone Encounter (Signed)
Pt called about her depression medication has been discontinued by counselor with speaking to pt. Pt called the her counselor and was told she will be out of the office til 04/23/16. Pt stated that Dr Derrel Nip has prescribed that medication before what should she do? Please advise? Pt is out of that medication of doxepin (SINEQUAN) 25 MG capsule.   Call pt @ [phone]. Thank you!

## 2016-04-18 MED ORDER — DOXEPIN HCL 25 MG PO CAPS: ORAL_CAPSULE | ORAL | 0 refills | Status: DC

## 2016-04-18 MED FILL — DOXEPIN 25 MG CAPSULE: 25 | 30 days supply | Qty: 30 | Fill #0

## 2016-04-18 NOTE — Telephone Encounter (Signed)
Patient notified of medication refill, patient stated Dr. Wylene Simmer no longer excepts her insurance thus she had to switch to [place] counseling.

## 2016-04-24 MED FILL — DOXEPIN 75 MG CAPSULE: 75 | 30 days supply | Qty: 60 | Fill #0

## 2016-05-02 MED FILL — ALPRAZolam 1 MG TABS: 1 | 30 days supply | Qty: 45 | Fill #2

## 2016-05-02 MED FILL — traMADol HCL 50 MG TABS: 50 | 15 days supply | Qty: 60 | Fill #1

## 2016-05-07 MED FILL — ZOLPIDEM TART ER 12.5 MG TA: 12.5 | 30 days supply | Qty: 30 | Fill #1

## 2016-05-14 ENCOUNTER — Telehealth: Payer: Self-pay | Admitting: Internal Medicine

## 2016-05-14 NOTE — Telephone Encounter (Signed)
Pt called and is requesting a refill on her cyclobenzaprine (FLEXERIL) 10 MG tablet.   [name], [name] - [address] [facility].  Call pt @ [phone]

## 2016-05-15 MED ORDER — CYCLOBENZAPRINE HCL 10 MG PO TABS: ORAL_TABLET | ORAL | 2 refills | Status: DC

## 2016-05-15 MED FILL — CYCLOBENZAPRINE 10 MG TAB: 10 | 90 days supply | Qty: 90 | Fill #0

## 2016-05-15 NOTE — Telephone Encounter (Signed)
Refill ok'd and sent.

## 2016-05-15 NOTE — Telephone Encounter (Signed)
No OV since 07/24/15 ok to fill flexeril?

## 2016-05-20 MED FILL — DEXTROAMP-AMP 10 MG TAB: 10 | 30 days supply | Qty: 60 | Fill #0

## 2016-05-31 MED FILL — DOXEPIN 75 MG CAPSULE: 75 | 30 days supply | Qty: 60 | Fill #1

## 2016-05-31 MED FILL — ALPRAZolam 0.5 MG TABS: 0.5 | 10 days supply | Qty: 10 | Fill #0

## 2016-06-11 DIAGNOSIS — H52223 Regular astigmatism, bilateral: Secondary | ICD-10-CM | POA: Diagnosis not present

## 2016-06-11 DIAGNOSIS — H5213 Myopia, bilateral: Secondary | ICD-10-CM | POA: Diagnosis not present

## 2016-06-12 MED FILL — ZOLPIDEM TART ER 12.5 MG TA: 12.5 | 30 days supply | Qty: 30 | Fill #2

## 2016-06-28 MED FILL — traMADol HCL 50 MG TABS: 50 | 15 days supply | Qty: 60 | Fill #2

## 2016-07-01 MED FILL — ALPRAZolam 0.5 MG TABS: 0.5 | 10 days supply | Qty: 10 | Fill #1

## 2016-07-01 MED FILL — DOXEPIN 75 MG CAPSULE: 75 | 30 days supply | Qty: 60 | Fill #2

## 2016-07-05 MED FILL — DEXTROAMP-AMPHETAMIN 10 MG: 10 | 30 days supply | Qty: 60 | Fill #0

## 2016-07-11 DIAGNOSIS — F919 Conduct disorder, unspecified: Secondary | ICD-10-CM | POA: Diagnosis not present

## 2016-07-11 DIAGNOSIS — F329 Major depressive disorder, single episode, unspecified: Secondary | ICD-10-CM | POA: Diagnosis not present

## 2016-07-11 DIAGNOSIS — F4000 Agoraphobia, unspecified: Secondary | ICD-10-CM | POA: Diagnosis not present

## 2016-07-12 MED FILL — ZOLPIDEM TART ER 12.5 MG TA: 12.5 | 30 days supply | Qty: 30 | Fill #0

## 2016-07-18 DIAGNOSIS — Z01419 Encounter for gynecological examination (general) (routine) without abnormal findings: Secondary | ICD-10-CM | POA: Diagnosis not present

## 2016-07-18 DIAGNOSIS — Z803 Family history of malignant neoplasm of breast: Secondary | ICD-10-CM | POA: Diagnosis not present

## 2016-07-18 DIAGNOSIS — Z124 Encounter for screening for malignant neoplasm of cervix: Secondary | ICD-10-CM | POA: Diagnosis not present

## 2016-08-05 ENCOUNTER — Other Ambulatory Visit: Payer: Self-pay | Admitting: Internal Medicine

## 2016-08-05 DIAGNOSIS — Z760 Encounter for issue of repeat prescription: Secondary | ICD-10-CM

## 2016-08-05 MED FILL — DOXEPIN 150 MG CAPSULE: 150 | 30 days supply | Qty: 30 | Fill #0

## 2016-08-05 MED FILL — ALPRAZolam 0.5 MG TABS: 0.5 | 10 days supply | Qty: 10 | Fill #2

## 2016-08-05 NOTE — Telephone Encounter (Signed)
Refill denied until seen./  Controlled substance

## 2016-08-05 NOTE — Telephone Encounter (Signed)
Last OV was in 07/2015, last refill was 03/25/16, no upcoming appt, please advise for refill, thanks

## 2016-08-13 MED FILL — ZOLPIDEM TART ER 12.5 MG TA: 12.5 | 30 days supply | Qty: 30 | Fill #1

## 2016-08-15 DIAGNOSIS — Z3046 Encounter for surveillance of implantable subdermal contraceptive: Secondary | ICD-10-CM | POA: Diagnosis not present

## 2016-08-15 MED FILL — NUVARING VAGINAL RING: 0.12-0.015 | 84 days supply | Qty: 3 | Fill #0

## 2016-08-20 MED FILL — CYCLOBENZAPRINE 10 MG TAB: 10 | 90 days supply | Qty: 90 | Fill #1

## 2016-08-20 MED FILL — DEXTROAMP-AMPHETAMIN 10 MG: 10 | 30 days supply | Qty: 60 | Fill #0

## 2016-09-05 MED FILL — DOXEPIN 150 MG CAPSULE: 150 | 30 days supply | Qty: 30 | Fill #1

## 2016-09-12 MED FILL — ZOLPIDEM TART ER 12.5 MG TA: 12.5 | 30 days supply | Qty: 30 | Fill #2

## 2016-09-26 ENCOUNTER — Telehealth: Payer: Self-pay | Admitting: Internal Medicine

## 2016-09-26 NOTE — Telephone Encounter (Signed)
Sonia Holden, please let her know that Dr Derrel Nip is out of the office.  Since this pt has not been seen in over one year, I am going to hold on refilling the tramadol.  She has an appt in June.  If acute issues, will need earlier appt.  Let me know if any problems.

## 2016-09-26 NOTE — Telephone Encounter (Signed)
Patient notified PCP is out of office and medication cannot be filled until PCP return due to patient has not been seen in one year.

## 2016-09-26 NOTE — Telephone Encounter (Signed)
Spoke with pt and she stated that she is completely out of her Tramadol.  Refilled: 03/25/2016 Last OV: 07/24/2015 Next OV: 11/13/2016

## 2016-09-26 NOTE — Telephone Encounter (Signed)
Pt would like a refill pt did schedule a appt for traMADol (ULTRAM) 50 MG tablet 11/13/2016. There was a appt avail on 09/30/2016 pt stated she could not do that one due to work. Thank you!  Pharmacy is [place], [name] - [address] [facility].  Call pt @ [phone].

## 2016-10-02 MED FILL — DEXTROAMP-AMP 10 MG TAB: 10 | 30 days supply | Qty: 60 | Fill #0

## 2016-10-11 MED FILL — DOXEPIN 150 MG CAPSULE: 150 | 30 days supply | Qty: 30 | Fill #2

## 2016-10-11 MED FILL — ZOLPIDEM TART ER 12.5 MG TA: 12.5 | 30 days supply | Qty: 30 | Fill #3

## 2016-11-13 ENCOUNTER — Ambulatory Visit (INDEPENDENT_AMBULATORY_CARE_PROVIDER_SITE_OTHER): Payer: 59 | Admitting: Internal Medicine

## 2016-11-13 ENCOUNTER — Encounter: Payer: Self-pay | Admitting: Internal Medicine

## 2016-11-13 VITALS — BP 114/78 | HR 107 | Temp 98.2°F | Resp 16 | Ht 67.0 in | Wt 227.8 lb

## 2016-11-13 DIAGNOSIS — Z760 Encounter for issue of repeat prescription: Secondary | ICD-10-CM

## 2016-11-13 DIAGNOSIS — M545 Low back pain, unspecified: Secondary | ICD-10-CM

## 2016-11-13 DIAGNOSIS — E6609 Other obesity due to excess calories: Secondary | ICD-10-CM

## 2016-11-13 DIAGNOSIS — E66812 Obesity, class 2: Secondary | ICD-10-CM

## 2016-11-13 DIAGNOSIS — R7301 Impaired fasting glucose: Secondary | ICD-10-CM

## 2016-11-13 DIAGNOSIS — E781 Pure hyperglyceridemia: Secondary | ICD-10-CM

## 2016-11-13 DIAGNOSIS — Z6835 Body mass index (BMI) 35.0-35.9, adult: Secondary | ICD-10-CM | POA: Diagnosis not present

## 2016-11-13 DIAGNOSIS — R635 Abnormal weight gain: Secondary | ICD-10-CM | POA: Diagnosis not present

## 2016-11-13 MED ORDER — VALACYCLOVIR HCL 500 MG PO TABS: 500.0000 mg | ORAL_TABLET | Freq: Two times a day (BID) | ORAL | 3 refills | Status: DC

## 2016-11-13 MED ORDER — DOXEPIN HCL 150 MG PO CAPS: 150.0000 mg | ORAL_CAPSULE | Freq: Every day | ORAL | Status: DC

## 2016-11-13 MED ORDER — TRAMADOL HCL 50 MG PO TABS: ORAL_TABLET | ORAL | 5 refills | Status: DC

## 2016-11-13 MED ORDER — CYCLOBENZAPRINE HCL 10 MG PO TABS: ORAL_TABLET | ORAL | 5 refills | Status: DC

## 2016-11-13 MED ORDER — IBUPROFEN 800 MG PO TABS: 800.0000 mg | ORAL_TABLET | Freq: Three times a day (TID) | ORAL | 5 refills | Status: DC | PRN

## 2016-11-13 MED FILL — VALACYCLOVIR HCL 500 MG TAB: 500 | 7 days supply | Qty: 14 | Fill #0

## 2016-11-13 MED FILL — traMADol HCL 50 MG TABS: 50 | 23 days supply | Qty: 90 | Fill #0

## 2016-11-13 MED FILL — CYCLOBENZAPRINE 10 MG TAB: 10 | 10 days supply | Qty: 30 | Fill #0

## 2016-11-13 MED FILL — IBUPROFEN 800 MG TAB: 800 | 30 days supply | Qty: 90 | Fill #0

## 2016-11-13 NOTE — Progress Notes (Signed)
Subjective:  Patient ID: Sonia Holden, female    DOB: [birth year]  Age: 31 y.o. MRN: [id number]  CC: The primary encounter diagnosis was Weight gain. Diagnoses of Medication refill, Impaired fasting glucose, Lumbar back pain, Class 2 obesity due to excess calories without serious comorbidity with body mass index (BMI) of 35.0 to 35.9 in adult, and Hypertriglyceridemia were also pertinent to this visit.  HPI Sonia Holden presents for FOLLOW UP on insomnia, ADD adn back pain   LAST SEEN FEB 2017  Has recurrent episodes of low back pain, non radiating and is managing it with  MASSAGE.  Using tramadol and flexeril makes her hyperactive.   Takes tylenol and ibuprofen on the other days ,  800 MG dose,.  Wants rx   Lab Results  Component Value Date   CREATININE 0.82 11/13/2016       Outpatient Medications Prior to Visit  Medication Sig Dispense Refill  . ALPRAZolam (XANAX) 1 MG tablet Take 1 tablet (1 mg total) by mouth 2 (two) times daily as needed for sleep. 60 tablet 0  . amphetamine-dextroamphetamine (ADDERALL) 10 MG tablet Take 10 mg by mouth 2 (two) times daily.    . Multiple Vitamin (MULTIVITAMIN) tablet Take 1 tablet by mouth daily.    . cyclobenzaprine (FLEXERIL) 10 MG tablet TAKE 1 TABLET BY MOUTH 3 TIMES DAILY AS NEEDED FOR MUSCLE SPASMS. 90 tablet 2  . doxepin (SINEQUAN) 25 MG capsule 1 capsule at bedtime (Patient taking differently: 150 mg. 1 capsule at bedtime) 30 capsule 0  . traMADol (ULTRAM) 50 MG tablet TAKE 1 TABLET BY MOUTH EVERY 6 HOURS AS NEEDED FOR PAIN 60 tablet 2  . valACYclovir (VALTREX) 500 MG tablet Take 500 mg by mouth 2 (two) times daily.    Marland Kitchen zolpidem (AMBIEN CR) 12.5 MG CR tablet Take 1 tablet by mouth at bedtime as needed.  5  . naproxen (NAPROSYN) 500 MG tablet Take 1 tablet (500 mg total) by mouth 2 (two) times daily with a meal. (Patient not taking: Reported on 11/13/2016) 60 tablet 2  . Suvorexant (BELSOMRA) 20 MG TABS Take 1 tablet by mouth  at bedtime. (Patient not taking: Reported on 11/13/2016) 30 tablet 0   No facility-administered medications prior to visit.     Review of Systems;  Patient denies headache, fevers, malaise, unintentional weight loss, skin rash, eye pain, sinus congestion and sinus pain, sore throat, dysphagia,  hemoptysis , cough, dyspnea, wheezing, chest pain, palpitations, orthopnea, edema, abdominal pain, nausea, melena, diarrhea, constipation, flank pain, dysuria, hematuria, urinary  Frequency, nocturia, numbness, tingling, seizures,  Focal weakness, Loss of consciousness,  Tremor, insomnia, depression, anxiety, and suicidal ideation.      Objective:  BP 114/78 (BP Location: Left Arm, Patient Position: Sitting, Cuff Size: Large)   Pulse (!) 107   Temp 98.2 F (36.8 C) (Oral)   Resp 16   Ht 5\' 7"  (1.702 m)   Wt 227 lb 12.8 oz (103.3 kg)   SpO2 98%   BMI 35.68 kg/m   BP Readings from Last 3 Encounters:  11/13/16 114/78  07/24/15 118/76  06/15/14 112/76    Wt Readings from Last 3 Encounters:  11/13/16 227 lb 12.8 oz (103.3 kg)  07/24/15 229 lb 3.2 oz (104 kg)  06/15/14 208 lb 8 oz (94.6 kg)    General appearance: alert, cooperative and appears stated age Ears: normal TM's and external ear canals both ears Throat: lips, mucosa, and tongue normal; teeth and gums normal Neck:  no adenopathy, no carotid bruit, supple, symmetrical, trachea midline and thyroid not enlarged, symmetric, no tenderness/mass/nodules Back: symmetric, no curvature. ROM normal. No CVA tenderness. Lungs: clear to auscultation bilaterally Heart: regular rate and rhythm, S1, S2 normal, no murmur, click, rub or gallop Abdomen: soft, non-tender; bowel sounds normal; no masses,  no organomegaly Pulses: 2+ and symmetric Skin: Skin color, texture, turgor normal. No rashes or lesions Lymph nodes: Cervical, supraclavicular, and axillary nodes normal.  Lab Results  Component Value Date   HGBA1C 5.5 11/13/2016   HGBA1C 5.4  06/30/2014    Lab Results  Component Value Date   CREATININE 0.82 11/13/2016   CREATININE 0.56 08/30/2015   CREATININE 0.49 06/30/2014    Lab Results  Component Value Date   WBC 15.3 (H) 02/06/2009   HGB (H) 02/06/2009    16.7 QA FLAGS MODIFIED BY DEMOGRAPHIC UPDATE ON 09/06 AT 0344   HCT (H) 02/06/2009    49.0 QA FLAGS MODIFIED BY DEMOGRAPHIC UPDATE ON 09/06 AT 0344   PLT 269 02/06/2009   GLUCOSE 91 11/13/2016   CHOL 211 (H) 11/13/2016   TRIG (H) 11/13/2016    529.0 Triglyceride is over 400; calculations on Lipids are invalid.   HDL 37.10 (L) 11/13/2016   LDLDIRECT 114.0 11/13/2016   LDLCALC 102 (H) 06/30/2014   ALT 23 11/13/2016   AST 15 11/13/2016   NA 138 11/13/2016   K 4.3 11/13/2016   CL 107 11/13/2016   CREATININE 0.82 11/13/2016   BUN 17 11/13/2016   CO2 23 11/13/2016   TSH 3.14 11/13/2016   INR 0.9 02/06/2009   HGBA1C 5.5 11/13/2016    Dg Lumbar Spine Complete  Result Date: 02/01/2013 *RADIOLOGY REPORT* Clinical Data: Back pain.  No injury. LUMBAR SPINE - COMPLETE 4+ VIEW Comparison: None. Findings: Normal alignment.  No fracture.  No spondylolysis.  No significant degenerative changes. IMPRESSION: Negative lumbar spine study. Original Report Authenticated By: Jorje Guild    Assessment & Plan:   Problem List Items Addressed This Visit    Obesity    I have addressed  BMI and recommended a low glycemic index diet utilizing smaller more frequent meals to increase metabolism.  I have also recommended that patient start exercising with a goal of 30 minutes of aerobic exercise a minimum of 5 days per week. Screening for lipid disorders, thyroid and diabetes to be done today.  Lab Results  Component Value Date   TSH 3.14 11/13/2016   Lab Results  Component Value Date   CHOL 211 (H) 11/13/2016   HDL 37.10 (L) 11/13/2016   LDLCALC 102 (H) 06/30/2014   LDLDIRECT 114.0 11/13/2016   TRIG (H) 11/13/2016    529.0 Triglyceride is over 400; calculations on  Lipids are invalid.   CHOLHDL 6 11/13/2016   Lab Results  Component Value Date   HGBA1C 5.5 11/13/2016           Lumbar back pain    Managed with massage therapy.  Ibuprofen 800 mg  rx given.       Relevant Medications   ibuprofen (ADVIL,MOTRIN) 800 MG tablet   cyclobenzaprine (FLEXERIL) 10 MG tablet   traMADol (ULTRAM) 50 MG tablet   Hypertriglyceridemia    Noted on nonfsting labs today (patient had oatmeal this mornign ).  Low GI diet recommended.  repeat 3 months        Other Visit Diagnoses    Weight gain    -  Primary   Relevant Orders   Lipid panel (Completed)  Comprehensive metabolic panel (Completed)   TSH (Completed)   Medication refill       Relevant Medications   traMADol (ULTRAM) 50 MG tablet   Impaired fasting glucose       Relevant Orders   Hemoglobin A1c (Completed)      I have discontinued Ms. Greth's naproxen, zolpidem, Suvorexant, and doxepin. I have also changed her doxepin and valACYclovir. Additionally, I am having her start on ibuprofen. Lastly, I am having her maintain her amphetamine-dextroamphetamine, multivitamin, ALPRAZolam, B-COMPLEX-C PO, Biotin, NUVARING, Zinc, cyclobenzaprine, and traMADol.  Meds ordered this encounter  Medications  . B-COMPLEX-C PO    Sig: B-Complex  . Biotin 1 MG CAPS    Sig: biotin  . DISCONTD: doxepin (SINEQUAN) 150 MG capsule  . NUVARING 0.12-0.015 MG/24HR vaginal ring    Refill:  4  . Zinc 10 MG LOZG    Sig: zinc  . ibuprofen (ADVIL,MOTRIN) 800 MG tablet    Sig: Take 1 tablet (800 mg total) by mouth every 8 (eight) hours as needed.    Dispense:  90 tablet    Refill:  5  . doxepin (SINEQUAN) 150 MG capsule    Sig: Take 1 capsule (150 mg total) by mouth at bedtime.  . valACYclovir (VALTREX) 500 MG tablet    Sig: Take 1 tablet (500 mg total) by mouth 2 (two) times daily.    Dispense:  14 tablet    Refill:  3  . cyclobenzaprine (FLEXERIL) 10 MG tablet    Sig: TAKE 1 TABLET BY MOUTH 3 TIMES DAILY AS  NEEDED FOR MUSCLE SPASMS.    Dispense:  30 tablet    Refill:  5  . traMADol (ULTRAM) 50 MG tablet    Sig: TAKE 1 TABLET BY MOUTH EVERY 6 HOURS AS NEEDED FOR PAIN    Dispense:  90 tablet    Refill:  5    Medications Discontinued During This Encounter  Medication Reason  . naproxen (NAPROSYN) 500 MG tablet Patient has not taken in last 30 days  . Suvorexant (BELSOMRA) 20 MG TABS Patient has not taken in last 30 days  . doxepin (SINEQUAN) 25 MG capsule   . doxepin (SINEQUAN) 150 MG capsule   . zolpidem (AMBIEN CR) 12.5 MG CR tablet   . valACYclovir (VALTREX) 500 MG tablet Reorder  . cyclobenzaprine (FLEXERIL) 10 MG tablet Reorder  . traMADol (ULTRAM) 50 MG tablet Reorder    Follow-up: No Follow-up on file.   Crecencio Mc, MD

## 2016-11-13 NOTE — Patient Instructions (Signed)
GOOD TO SEE YOU  YOU CAN GET YOUR TDAP FOR FREE AT [place]

## 2016-11-14 ENCOUNTER — Encounter: Payer: Self-pay | Admitting: Internal Medicine

## 2016-11-14 DIAGNOSIS — F329 Major depressive disorder, single episode, unspecified: Secondary | ICD-10-CM | POA: Diagnosis not present

## 2016-11-14 DIAGNOSIS — F4000 Agoraphobia, unspecified: Secondary | ICD-10-CM | POA: Diagnosis not present

## 2016-11-14 DIAGNOSIS — F919 Conduct disorder, unspecified: Secondary | ICD-10-CM | POA: Diagnosis not present

## 2016-11-14 DIAGNOSIS — E781 Pure hyperglyceridemia: Secondary | ICD-10-CM | POA: Insufficient documentation

## 2016-11-14 LAB — COMPREHENSIVE METABOLIC PANEL
ALBUMIN: 4.3 g/dL (ref 3.5–5.2)
ALT: 23 U/L (ref 0–35)
AST: 15 U/L (ref 0–37)
Alkaline Phosphatase: 50 U/L (ref 39–117)
BILIRUBIN TOTAL: 0.2 mg/dL (ref 0.2–1.2)
BUN: 17 mg/dL (ref 6–23)
CALCIUM: 9.3 mg/dL (ref 8.4–10.5)
CHLORIDE: 107 meq/L (ref 96–112)
CO2: 23 meq/L (ref 19–32)
CREATININE: 0.82 mg/dL (ref 0.40–1.20)
GFR: 85.34 mL/min (ref >60.00)
Glucose, Bld: 91 mg/dL (ref 70–99)
Potassium: 4.3 mEq/L (ref 3.5–5.1)
Sodium: 138 mEq/L (ref 135–145)
Total Protein: 7.2 g/dL (ref 6.0–8.3)

## 2016-11-14 LAB — LIPID PANEL
Cholesterol: 211 mg/dL — ABNORMAL HIGH (ref 0–200)
HDL: 37.1 mg/dL — AB (ref >39.00)
Total CHOL/HDL Ratio: 6

## 2016-11-14 LAB — LDL CHOLESTEROL, DIRECT: Direct LDL: 114 mg/dL

## 2016-11-14 LAB — TSH: TSH: 3.14 u[IU]/mL (ref 0.35–4.50)

## 2016-11-14 LAB — HEMOGLOBIN A1C: Hgb A1c MFr Bld: 5.5 % (ref 4.6–6.5)

## 2016-11-14 MED FILL — ZOLPIDEM TART ER 12.5 MG TA: 12.5 | 30 days supply | Qty: 30 | Fill #0

## 2016-11-14 MED FILL — AMPHETAMINE SALTS 10 MG TAB: 10 | 30 days supply | Qty: 60 | Fill #0

## 2016-11-14 MED FILL — DOXEPIN 150 MG CAPSULE: 150 | 30 days supply | Qty: 30 | Fill #3

## 2016-11-14 MED FILL — NUVARING VAGINAL RING: 0.12-0.015 | 84 days supply | Qty: 3 | Fill #1

## 2016-11-14 NOTE — Assessment & Plan Note (Signed)
Managed with massage therapy.  Ibuprofen 800 mg  rx given.

## 2016-11-14 NOTE — Assessment & Plan Note (Signed)
Noted on nonfsting labs today (patient had oatmeal this mornign ).  Low GI diet recommended.  repeat 3 months

## 2016-11-14 NOTE — Assessment & Plan Note (Signed)
I have addressed  BMI and recommended a low glycemic index diet utilizing smaller more frequent meals to increase metabolism.  I have also recommended that patient start exercising with a goal of 30 minutes of aerobic exercise a minimum of 5 days per week. Screening for lipid disorders, thyroid and diabetes to be done today.  Lab Results  Component Value Date   TSH 3.14 11/13/2016   Lab Results  Component Value Date   CHOL 211 (H) 11/13/2016   HDL 37.10 (L) 11/13/2016   LDLCALC 102 (H) 06/30/2014   LDLDIRECT 114.0 11/13/2016   TRIG (H) 11/13/2016    529.0 Triglyceride is over 400; calculations on Lipids are invalid.   CHOLHDL 6 11/13/2016   Lab Results  Component Value Date   HGBA1C 5.5 11/13/2016

## 2016-11-15 MED FILL — ALPRAZolam 0.5 MG TABS: 0.5 | 10 days supply | Qty: 10 | Fill #0

## 2016-12-13 MED FILL — IBUPROFEN 800 MG TABLET: 800 | 30 days supply | Qty: 90 | Fill #1

## 2016-12-13 MED FILL — ZOLPIDEM TART ER 12.5 MG TA: 12.5 | 30 days supply | Qty: 30 | Fill #1

## 2016-12-13 MED FILL — CYCLOBENZAPRINE 10 MG TAB: 10 | 10 days supply | Qty: 30 | Fill #1

## 2016-12-13 MED FILL — traMADol HCL 50 MG TABS: 50 | 23 days supply | Qty: 90 | Fill #1

## 2016-12-16 MED FILL — DOXEPIN 150 MG CAPSULE: 150 | 30 days supply | Qty: 30 | Fill #0

## 2016-12-16 MED FILL — ALPRAZolam 0.5 MG TABS: 0.5 | 10 days supply | Qty: 10 | Fill #1

## 2016-12-16 MED FILL — DEXTROAMP-AMP 10 MG TAB: 10 | 30 days supply | Qty: 60 | Fill #0

## 2017-01-15 MED FILL — ZOLPIDEM TART ER 12.5 MG TA: 12.5 | 30 days supply | Qty: 30 | Fill #2

## 2017-01-15 MED FILL — ALPRAZolam 0.5 MG TABS: 0.5 | 10 days supply | Qty: 10 | Fill #2

## 2017-01-15 MED FILL — DOXEPIN 150 MG CAPSULE: 150 | 30 days supply | Qty: 30 | Fill #1

## 2017-01-15 MED FILL — CYCLOBENZAPRINE 10 MG TAB: 10 | 10 days supply | Qty: 30 | Fill #2

## 2017-02-07 MED FILL — DEXTROAMP-AMP 10 MG TAB: 10 | 30 days supply | Qty: 60 | Fill #0

## 2017-02-07 MED FILL — traMADol HCL 50 MG TABS: 50 | 23 days supply | Qty: 90 | Fill #2

## 2017-02-17 MED FILL — CYCLOBENZAPRINE 10 MG TAB: 10 | 10 days supply | Qty: 30 | Fill #3

## 2017-02-17 MED FILL — DOXEPIN 150 MG CAPSULE: 150 | 30 days supply | Qty: 30 | Fill #2

## 2017-02-17 MED FILL — ZOLPIDEM TART ER 12.5 MG TA: 12.5 | 30 days supply | Qty: 30 | Fill #3

## 2017-02-17 MED FILL — NUVARING VAGINAL RING: 0.12-0.015 | 84 days supply | Qty: 3 | Fill #2

## 2017-02-19 MED FILL — ALPRAZolam 0.5 MG TABS: 0.5 | 10 days supply | Qty: 10 | Fill #0

## 2017-03-13 DIAGNOSIS — F329 Major depressive disorder, single episode, unspecified: Secondary | ICD-10-CM | POA: Diagnosis not present

## 2017-03-13 DIAGNOSIS — F919 Conduct disorder, unspecified: Secondary | ICD-10-CM | POA: Diagnosis not present

## 2017-03-13 DIAGNOSIS — F4000 Agoraphobia, unspecified: Secondary | ICD-10-CM | POA: Diagnosis not present

## 2017-03-18 MED FILL — AMPHETAMINE SALTS 15 MG TAB: 15 | 30 days supply | Qty: 60 | Fill #0

## 2017-03-18 MED FILL — DOXEPIN 150 MG CAPSULE: 150 | 30 days supply | Qty: 30 | Fill #3

## 2017-03-18 MED FILL — CYCLOBENZAPRINE 10 MG TAB: 10 | 10 days supply | Qty: 30 | Fill #4

## 2017-03-18 MED FILL — traMADol HCL 50 MG TABS: 50 | 23 days supply | Qty: 90 | Fill #3

## 2017-03-20 DIAGNOSIS — A630 Anogenital (venereal) warts: Secondary | ICD-10-CM | POA: Diagnosis not present

## 2017-03-20 DIAGNOSIS — R87612 Low grade squamous intraepithelial lesion on cytologic smear of cervix (LGSIL): Secondary | ICD-10-CM | POA: Diagnosis not present

## 2017-03-20 MED FILL — ALPRAZolam 0.5 MG TABS: 0.5 | 10 days supply | Qty: 10 | Fill #0

## 2017-03-20 MED FILL — ZOLPIDEM TART ER 12.5 MG TA: 12.5 | 30 days supply | Qty: 30 | Fill #0

## 2017-04-21 MED FILL — ALPRAZolam 0.5 MG TABS: 0.5 | 10 days supply | Qty: 10 | Fill #1

## 2017-04-21 MED FILL — ZOLPIDEM TART ER 12.5 MG TA: 12.5 | 30 days supply | Qty: 30 | Fill #1

## 2017-04-21 MED FILL — CYCLOBENZAPRINE 10 MG TAB: 10 | 10 days supply | Qty: 30 | Fill #5

## 2017-04-23 MED FILL — AMPHETAMINE SALTS 15 MG TAB: 15 | 30 days supply | Qty: 60 | Fill #0

## 2017-04-23 MED FILL — DOXEPIN 150 MG CAPSULE: 150 | 30 days supply | Qty: 30 | Fill #0

## 2017-05-14 MED FILL — traMADol HCL 50 MG TABS: 50 | 23 days supply | Qty: 90 | Fill #4

## 2017-05-14 MED FILL — ALPRAZolam 0.5 MG TABS: 0.5 | 10 days supply | Qty: 10 | Fill #2

## 2017-05-22 ENCOUNTER — Other Ambulatory Visit: Payer: Self-pay | Admitting: Internal Medicine

## 2017-05-22 MED FILL — NUVARING VAGINAL RING: 0.12-0.015 | 84 days supply | Qty: 3 | Fill #3

## 2017-05-22 MED FILL — ZOLPIDEM TART ER 12.5 MG TA: 12.5 | 30 days supply | Qty: 30 | Fill #2

## 2017-06-11 ENCOUNTER — Ambulatory Visit: Payer: 59 | Admitting: Family

## 2017-06-11 MED FILL — AZITHROMYCIN 250 MG TABLET: 250 | 5 days supply | Qty: 6 | Fill #0

## 2017-06-11 MED FILL — ALPRAZolam 0.5 MG TABS: 0.5 | 10 days supply | Qty: 10 | Fill #0

## 2017-06-11 MED FILL — CYCLOBENZAPRINE 10 MG TAB: 10 | 10 days supply | Qty: 30 | Fill #0

## 2017-06-11 MED FILL — DEXTROAMP-AMPHETAMIN 15 MG: 15 | 30 days supply | Qty: 60 | Fill #0

## 2017-06-11 MED FILL — DOXEPIN 150 MG CAPSULE: 150 | 30 days supply | Qty: 30 | Fill #1

## 2017-06-22 ENCOUNTER — Ambulatory Visit (HOSPITAL_COMMUNITY)
Admission: EM | Admit: 2017-06-22 | Discharge: 2017-06-22 | Disposition: A | Discharge status: Home / Self Care | Payer: 59 | Attending: Internal Medicine | Admitting: Internal Medicine

## 2017-06-22 ENCOUNTER — Ambulatory Visit (INDEPENDENT_AMBULATORY_CARE_PROVIDER_SITE_OTHER): Payer: 59

## 2017-06-22 ENCOUNTER — Encounter (HOSPITAL_COMMUNITY): Payer: Self-pay | Admitting: Family Medicine

## 2017-06-22 DIAGNOSIS — S8991XA Unspecified injury of right lower leg, initial encounter: Secondary | ICD-10-CM | POA: Diagnosis not present

## 2017-06-22 DIAGNOSIS — M25461 Effusion, right knee: Secondary | ICD-10-CM

## 2017-06-22 MED ORDER — MELOXICAM 7.5 MG PO TABS: 7.5000 mg | ORAL_TABLET | Freq: Every day | ORAL | 0 refills | Status: DC

## 2017-06-22 MED ORDER — HYDROCODONE-ACETAMINOPHEN 5-325 MG PO TABS: 1.0000 | ORAL_TABLET | Freq: Four times a day (QID) | ORAL | 0 refills | Status: DC | PRN

## 2017-06-22 NOTE — ED Notes (Signed)
Per pt she did not want to Crutches due to already having one. Pt husband went in the car and brought crutches in for staff to see. Staff informed provider that pt has her own crutches ad she verbalized understanding.

## 2017-06-22 NOTE — ED Provider Notes (Signed)
[facility]    CSN: [id number] Arrival date & time: 06/22/17  1814     History   Chief Complaint Chief Complaint  Patient presents with  . Knee Injury    HPI Sonia Holden is a 32 y.o. female.   32 year old female comes in for right knee pain after falling 2 days ago playing volleyball.  States she dove to catch the ball, and fell.  Does not recall where she had her knee.  She was able to ambulate after accident, but pain increased throughout the days is now with painful weightbearing.  She has also continued to have swelling around the knee.  She denies any swelling of the calf, erythema, increased warmth.  She has been taking tramadol, Flexeril, ibuprofen, Tylenol without relief.      Past Medical History:  Diagnosis Date  . ADHD (attention deficit hyperactivity disorder)   . Chronic insomnia   . Depression   . Panic attacks     Patient Active Problem List   Diagnosis Date Noted  . Hypertriglyceridemia 11/14/2016  . Obesity 06/18/2014  . Family history of breast cancer in first degree relative 06/18/2014  . Insomnia 03/16/2014  . ADD (attention deficit disorder) 03/16/2014  . Right foot pain 04/24/2013  . Lumbar back pain 02/01/2013    Past Surgical History:  Procedure Laterality Date  . ADENOIDECTOMY    . NOSE SURGERY      OB History    No data available       Home Medications    Prior to Admission medications   Medication Sig Start Date End Date Taking? Authorizing Provider  ALPRAZolam Duanne Moron) 1 MG tablet Take 1 tablet (1 mg total) by mouth 2 (two) times daily as needed for sleep. 07/24/15   Crecencio Mc, MD  amphetamine-dextroamphetamine (ADDERALL) 10 MG tablet Take 10 mg by mouth 2 (two) times daily.    [provider]  B-COMPLEX-C PO B-Complex    [provider]  Biotin 1 MG CAPS biotin    [provider]  cyclobenzaprine (FLEXERIL) 10 MG tablet TAKE 1 TABLET BY MOUTH 3 TIMES DAILY AS NEEDED FOR  MUSCLE SPASMS. 05/23/17   Crecencio Mc, MD  doxepin (SINEQUAN) 150 MG capsule Take 1 capsule (150 mg total) by mouth at bedtime. 11/13/16   Crecencio Mc, MD  HYDROcodone-acetaminophen (NORCO/VICODIN) 5-325 MG tablet Take 1 tablet by mouth every 6 (six) hours as needed for severe pain. 06/22/17   Tasia Catchings, Amy V, PA-C  ibuprofen (ADVIL,MOTRIN) 800 MG tablet Take 1 tablet (800 mg total) by mouth every 8 (eight) hours as needed. 11/13/16   Crecencio Mc, MD  meloxicam (MOBIC) 7.5 MG tablet Take 1 tablet (7.5 mg total) by mouth daily. 06/22/17   Tasia Catchings, Amy V, PA-C  Multiple Vitamin (MULTIVITAMIN) tablet Take 1 tablet by mouth daily.    [provider]  NUVARING 0.12-0.015 MG/24HR vaginal ring  08/15/16   [provider]  traMADol (ULTRAM) 50 MG tablet TAKE 1 TABLET BY MOUTH EVERY 6 HOURS AS NEEDED FOR PAIN 11/13/16   Crecencio Mc, MD  valACYclovir (VALTREX) 500 MG tablet Take 1 tablet (500 mg total) by mouth 2 (two) times daily. 11/13/16   Crecencio Mc, MD  Zinc 10 MG LOZG zinc    [provider]    Family History Family History  Problem Relation Age of Onset  . Cancer Mother        breast  . Depression Mother   .  Kidney disease Father   . Hyperlipidemia Father   . Arthritis Father     Social History Social History   Tobacco Use  . Smoking status: Former Smoker    Years: 0.50    Last attempt to quit: 03/15/2014    Years since quitting: 3.2  . Smokeless tobacco: Never Used  Substance Use Topics  . Alcohol use: Yes    Alcohol/week: 0.0 oz    Comment: occassionally   . Drug use: No     Allergies   Trazodone   Review of Systems Review of Systems  Reason unable to perform ROS: See HPI as above.     Physical Exam Triage Vital Signs ED Triage Vitals  Enc Vitals Group     BP 06/22/17 1902 (!) 145/75     Pulse Rate 06/22/17 1902 (!) 113     Resp 06/22/17 1902 18     Temp --      Temp src --      SpO2 06/22/17 1902 100 %     Weight --       Height --      Head Circumference --      Peak Flow --      Pain Score 06/22/17 1858 7     Pain Loc --      Pain Edu? --      Excl. in [place]? --    No data found.  Updated Vital Signs BP (!) 145/75   Pulse (!) 113   Resp 18   LMP 05/27/2017   SpO2 100%   Visual Acuity Right Eye Distance:   Left Eye Distance:   Bilateral Distance:    Right Eye Near:   Left Eye Near:    Bilateral Near:     Physical Exam  Constitutional: She is oriented to person, place, and time. She appears well-developed and well-nourished. No distress.  HENT:  Head: Normocephalic and atraumatic.  Eyes: Conjunctivae are normal. Pupils are equal, round, and reactive to light.  Musculoskeletal:  Generalized swelling of the right knee.  Small contusion on the patellar.  No tenderness on palpation.  Decreased flexion, range of motion exacerbates the pain.  Strength normal and equal bilaterally.  Sensation intact and equal bilaterally.  Neurological: She is alert and oriented to person, place, and time.     UC Treatments / Results  Labs (all labs ordered are listed, but only abnormal results are displayed) Labs Reviewed - No data to display  EKG  EKG Interpretation None       Radiology Dg Knee Complete 4 Views Right  Result Date: 06/22/2017 CLINICAL DATA:  Pt fell while paying volleyball x2 days ago. Pt unsure on how knee got injured. Pain increases with medial rotation of leg. Pain increases when flexing knee past 90 degrees or straightening past 35 degrees. Pain focused around pa.*comment was truncated* EXAM: RIGHT KNEE - COMPLETE 4+ VIEW COMPARISON:  None. FINDINGS: No fracture of the proximal tibia or distal femur. Patella is normal. Moderate suprapatellar joint effusion. IMPRESSION: 1. Moderate suprapatellar joint effusion. 2. No fracture or dislocation Electronically Signed   By: Suzy Bouchard M.D.   On: 06/22/2017 19:54    Procedures Procedures (including critical care time)  Medications  Ordered in UC Medications - No data to display   Initial Impression / Assessment and Plan / UC Course  I have reviewed the triage vital signs and the nursing notes.  Pertinent labs & imaging results that were available during my  care of the patient were reviewed by me and considered in my medical decision making (see chart for details).    X-ray negative for fracture or dislocation, does show moderate suprapatellar joint effusion.  Start Mobic as directed.  Norco for breakthrough pain.  Continue ice compress, elevation.  Knee sleeve and crutches provided.  Follow-up with orthopedics for further evaluation.  Return precautions given.  Final Clinical Impressions(s) / UC Diagnoses   Final diagnoses:  Injury of right knee, initial encounter    ED Discharge Orders        Ordered    meloxicam (MOBIC) 7.5 MG tablet  Daily     06/22/17 2004    HYDROcodone-acetaminophen (NORCO/VICODIN) 5-325 MG tablet  Every 6 hours PRN     06/22/17 2004      Controlled Substance Prescriptions [place] Controlled Substance Registry consulted? Yes, I have consulted the [place] Controlled Substances Registry for this patient, and feel the risk/benefit ratio today is favorable for proceeding with this prescription for a controlled substance.   Ok Edwards, PA-C 06/22/17 2008

## 2017-06-22 NOTE — Discharge Instructions (Signed)
X-ray negative for fracture or dislocation.  It did show fluid in the knee.  Take Mobic as directed.  Norco for breakthrough pain.  Continue ice compress, elevation.  Knee sleeve and crutches.  Follow-up with orthopedics for further evaluation and treatment needed.  If noticing one-sided swelling of the right calf with redness, increased warmth, go to the emergency department for further evaluation.

## 2017-06-22 NOTE — ED Triage Notes (Signed)
Pt here for right knee pain after falling on it Friday afternoon playing volleyball.

## 2017-06-24 DIAGNOSIS — M25561 Pain in right knee: Secondary | ICD-10-CM | POA: Diagnosis not present

## 2017-06-24 MED FILL — HYDROCODON-APAP 5-325: 5-325 | 5 days supply | Qty: 20 | Fill #0

## 2017-06-27 DIAGNOSIS — S83511D Sprain of anterior cruciate ligament of right knee, subsequent encounter: Secondary | ICD-10-CM | POA: Diagnosis not present

## 2017-07-04 ENCOUNTER — Telehealth: Payer: Self-pay | Admitting: Internal Medicine

## 2017-07-04 MED ORDER — CYCLOBENZAPRINE HCL 10 MG PO TABS: 10.0000 mg | ORAL_TABLET | Freq: Three times a day (TID) | ORAL | 0 refills | Status: DC

## 2017-07-04 MED ORDER — MELOXICAM 7.5 MG PO TABS: 7.5000 mg | ORAL_TABLET | Freq: Every day | ORAL | 0 refills | Status: DC

## 2017-07-04 MED FILL — MELOXICAM 7.5 MG TABLET: 7.5 | 15 days supply | Qty: 15 | Fill #0

## 2017-07-04 MED FILL — CYCLOBENZAPRINE 10 MG TAB: 10 | 10 days supply | Qty: 30 | Fill #0

## 2017-07-04 MED FILL — ZOLPIDEM TART ER 12.5 MG TA: 12.5 | 30 days supply | Qty: 30 | Fill #3

## 2017-07-04 NOTE — Telephone Encounter (Signed)
Copied from [place]. Topic: Inquiry >> Jul 04, 2017 12:09 PM Neva Seat wrote: Flexeril 10 mg  Pt is out of the medication and needing this or something stronger for a torn muscle that happened in mid [place], [name] - [address] [facility]. [address] [name] [address] Phone: [phone] Fax: [phone] Not a 24 hour pharmacy; exact hours not known

## 2017-07-04 NOTE — Telephone Encounter (Signed)
Flexeril 10 mg refill Last OV: ED visit 06/22/17 for knee injury-prescribed Meloxicam and Norco Last Refill:05/23/17 30 tabs/5 refill Pharmacy: [place], [name] - [address] [facility]. [address] [name] [address] Phone: [phone] Fax: [phone]  Pt is out of the medication and needing this or something stronger for a torn muscle that happened in mid Jan.

## 2017-07-04 NOTE — Telephone Encounter (Signed)
Please advise. Patient was seen 06/22/17 at Urgent care for this. Patient has not been seen here since 11/2016

## 2017-07-04 NOTE — Telephone Encounter (Signed)
Ok to refill the flexeril and meloxicam.

## 2017-07-04 NOTE — Telephone Encounter (Signed)
Spoke with patient and let her know that Meloxicam and flexeril has been sent to the pharmacy.

## 2017-07-09 DIAGNOSIS — M23611 Other spontaneous disruption of anterior cruciate ligament of right knee: Secondary | ICD-10-CM | POA: Diagnosis not present

## 2017-07-11 DIAGNOSIS — S83511D Sprain of anterior cruciate ligament of right knee, subsequent encounter: Secondary | ICD-10-CM | POA: Diagnosis not present

## 2017-07-11 MED FILL — HYDROCODON-APAP 5-325: 5-325 | 5 days supply | Qty: 20 | Fill #0

## 2017-07-14 DIAGNOSIS — S83511D Sprain of anterior cruciate ligament of right knee, subsequent encounter: Secondary | ICD-10-CM | POA: Diagnosis not present

## 2017-07-16 DIAGNOSIS — S83511D Sprain of anterior cruciate ligament of right knee, subsequent encounter: Secondary | ICD-10-CM | POA: Diagnosis not present

## 2017-07-17 DIAGNOSIS — S83511D Sprain of anterior cruciate ligament of right knee, subsequent encounter: Secondary | ICD-10-CM | POA: Diagnosis not present

## 2017-07-17 MED FILL — DOXEPIN 150 MG CAPSULE: 150 | 30 days supply | Qty: 30 | Fill #2

## 2017-07-22 ENCOUNTER — Encounter (HOSPITAL_BASED_OUTPATIENT_CLINIC_OR_DEPARTMENT_OTHER): Payer: Self-pay

## 2017-07-22 ENCOUNTER — Other Ambulatory Visit: Payer: Self-pay

## 2017-07-22 NOTE — Progress Notes (Signed)
Spoke with:  Sonia Holden NPO:   No food after midnight/Clear liquids until 6:00AM DOS Arrival time: 10:30AM Labs: Urine preg, Hemoglobin AM medications:  None Pre op orders: No Ride home:  Idelle Jo (mom) [phone]

## 2017-07-23 ENCOUNTER — Encounter: Payer: 59 | Admitting: Internal Medicine

## 2017-07-23 DIAGNOSIS — F4000 Agoraphobia, unspecified: Secondary | ICD-10-CM | POA: Diagnosis not present

## 2017-07-23 DIAGNOSIS — F329 Major depressive disorder, single episode, unspecified: Secondary | ICD-10-CM | POA: Diagnosis not present

## 2017-07-23 DIAGNOSIS — F919 Conduct disorder, unspecified: Secondary | ICD-10-CM | POA: Diagnosis not present

## 2017-07-25 MED FILL — DEXTROAMP-AMPHETAMIN 15 MG: 15 | 30 days supply | Qty: 60 | Fill #0

## 2017-07-27 ENCOUNTER — Telehealth: Payer: Self-pay | Admitting: Internal Medicine

## 2017-07-27 DIAGNOSIS — S83519A Sprain of anterior cruciate ligament of unspecified knee, initial encounter: Secondary | ICD-10-CM | POA: Insufficient documentation

## 2017-07-27 DIAGNOSIS — S83511S Sprain of anterior cruciate ligament of right knee, sequela: Secondary | ICD-10-CM

## 2017-07-27 HISTORY — DX: Sprain of anterior cruciate ligament of unspecified knee, initial encounter: S83.519A

## 2017-07-27 NOTE — Telephone Encounter (Signed)
Chart updated with ACL rupture  Right knee

## 2017-08-05 ENCOUNTER — Encounter (HOSPITAL_BASED_OUTPATIENT_CLINIC_OR_DEPARTMENT_OTHER)
Admission: RE | Disposition: A | Discharge status: Home / Self Care | Payer: Self-pay | Source: Ambulatory Visit | Attending: Orthopedic Surgery

## 2017-08-05 ENCOUNTER — Ambulatory Visit (HOSPITAL_BASED_OUTPATIENT_CLINIC_OR_DEPARTMENT_OTHER): Payer: 59 | Admitting: Anesthesiology

## 2017-08-05 ENCOUNTER — Ambulatory Visit (HOSPITAL_BASED_OUTPATIENT_CLINIC_OR_DEPARTMENT_OTHER)
Admission: RE | Admit: 2017-08-05 | Discharge: 2017-08-05 | Disposition: A | Discharge status: Home / Self Care | Payer: 59 | Source: Ambulatory Visit | Attending: Orthopedic Surgery | Admitting: Orthopedic Surgery

## 2017-08-05 ENCOUNTER — Encounter (HOSPITAL_BASED_OUTPATIENT_CLINIC_OR_DEPARTMENT_OTHER): Payer: Self-pay | Admitting: Anesthesiology

## 2017-08-05 DIAGNOSIS — E781 Pure hyperglyceridemia: Secondary | ICD-10-CM | POA: Diagnosis not present

## 2017-08-05 DIAGNOSIS — F17210 Nicotine dependence, cigarettes, uncomplicated: Secondary | ICD-10-CM | POA: Diagnosis not present

## 2017-08-05 DIAGNOSIS — F5104 Psychophysiologic insomnia: Secondary | ICD-10-CM | POA: Diagnosis not present

## 2017-08-05 DIAGNOSIS — S83281A Other tear of lateral meniscus, current injury, right knee, initial encounter: Secondary | ICD-10-CM | POA: Insufficient documentation

## 2017-08-05 DIAGNOSIS — Y9368 Activity, volleyball (beach) (court): Secondary | ICD-10-CM | POA: Diagnosis not present

## 2017-08-05 DIAGNOSIS — Z6841 Body Mass Index (BMI) 40.0 and over, adult: Secondary | ICD-10-CM | POA: Diagnosis not present

## 2017-08-05 DIAGNOSIS — F329 Major depressive disorder, single episode, unspecified: Secondary | ICD-10-CM | POA: Insufficient documentation

## 2017-08-05 DIAGNOSIS — G8918 Other acute postprocedural pain: Secondary | ICD-10-CM | POA: Diagnosis not present

## 2017-08-05 DIAGNOSIS — S83511A Sprain of anterior cruciate ligament of right knee, initial encounter: Secondary | ICD-10-CM | POA: Diagnosis not present

## 2017-08-05 DIAGNOSIS — M545 Low back pain: Secondary | ICD-10-CM | POA: Diagnosis not present

## 2017-08-05 DIAGNOSIS — F410 Panic disorder [episodic paroxysmal anxiety] without agoraphobia: Secondary | ICD-10-CM | POA: Insufficient documentation

## 2017-08-05 DIAGNOSIS — Z79899 Other long term (current) drug therapy: Secondary | ICD-10-CM | POA: Diagnosis not present

## 2017-08-05 DIAGNOSIS — F909 Attention-deficit hyperactivity disorder, unspecified type: Secondary | ICD-10-CM | POA: Insufficient documentation

## 2017-08-05 DIAGNOSIS — S83519A Sprain of anterior cruciate ligament of unspecified knee, initial encounter: Secondary | ICD-10-CM

## 2017-08-05 DIAGNOSIS — S83511S Sprain of anterior cruciate ligament of right knee, sequela: Secondary | ICD-10-CM

## 2017-08-05 HISTORY — DX: Low back pain, unspecified: M54.50

## 2017-08-05 HISTORY — DX: Obesity, unspecified: E66.9

## 2017-08-05 HISTORY — DX: Fracture of unspecified carpal bone, right wrist, initial encounter for closed fracture: S62.101A

## 2017-08-05 HISTORY — PX: ANTERIOR CRUCIATE LIGAMENT REPAIR: SHX115

## 2017-08-05 HISTORY — DX: Nausea with vomiting, unspecified: R11.2

## 2017-08-05 HISTORY — DX: Other specified postprocedural states: Z98.890

## 2017-08-05 HISTORY — DX: Low back pain: M54.5

## 2017-08-05 HISTORY — DX: Respiratory failure, unspecified, unspecified whether with hypoxia or hypercapnia: J96.90

## 2017-08-05 HISTORY — DX: Fracture of unspecified carpal bone, right wrist, initial encounter for closed fracture: S62.102A

## 2017-08-05 HISTORY — DX: Pure hyperglyceridemia: E78.1

## 2017-08-05 LAB — POCT PREGNANCY, URINE: Preg Test, Ur: NEGATIVE

## 2017-08-05 SURGERY — REPAIR, KNEE, ACL
Anesthesia: General | Site: Knee | Laterality: Right

## 2017-08-05 MED ORDER — CHLORHEXIDINE GLUCONATE 4 % EX LIQD
60.0000 mL | Freq: Once | CUTANEOUS | Status: DC
  Filled 2017-08-05: qty 118

## 2017-08-05 MED ORDER — KETOROLAC TROMETHAMINE 30 MG/ML IJ SOLN
30.0000 mg | Freq: Once | INTRAMUSCULAR | Status: DC | PRN
  Filled 2017-08-05: qty 1

## 2017-08-05 MED ORDER — KETOROLAC TROMETHAMINE 30 MG/ML IJ SOLN
INTRAMUSCULAR | Status: AC
  Filled 2017-08-05: qty 1

## 2017-08-05 MED ORDER — PROMETHAZINE HCL 25 MG/ML IJ SOLN
INTRAMUSCULAR | Status: AC
  Filled 2017-08-05: qty 1

## 2017-08-05 MED ORDER — ONDANSETRON HCL 4 MG/2ML IJ SOLN
INTRAMUSCULAR | Status: AC
  Filled 2017-08-05: qty 2

## 2017-08-05 MED ORDER — FENTANYL CITRATE (PF) 100 MCG/2ML IJ SOLN
INTRAMUSCULAR | Status: DC | PRN
  Administered 2017-08-05: 100 ug via INTRAVENOUS
  Administered 2017-08-05: 50 ug via INTRAVENOUS
  Administered 2017-08-05: 25 ug via INTRAVENOUS
  Administered 2017-08-05: 50 ug via INTRAVENOUS
  Administered 2017-08-05: 25 ug via INTRAVENOUS
  Administered 2017-08-05 (×2): 50 ug via INTRAVENOUS

## 2017-08-05 MED ORDER — SODIUM CHLORIDE 0.9 % IR SOLN
Status: DC | PRN
  Administered 2017-08-05: 3000 mL

## 2017-08-05 MED ORDER — LIDOCAINE 2% (20 MG/ML) 5 ML SYRINGE
INTRAMUSCULAR | Status: DC | PRN
  Administered 2017-08-05: 100 mg via INTRAVENOUS

## 2017-08-05 MED ORDER — SODIUM CHLORIDE 0.9 % IR SOLN
Status: DC | PRN
  Administered 2017-08-05 (×2): 3000 mL
  Administered 2017-08-05: 6000 mL

## 2017-08-05 MED ORDER — MIDAZOLAM HCL 5 MG/5ML IJ SOLN: INTRAMUSCULAR | Status: DC | PRN

## 2017-08-05 MED ORDER — OXYCODONE HCL 5 MG/5ML PO SOLN
5.0000 mg | Freq: Once | ORAL | Status: AC | PRN
  Filled 2017-08-05: qty 5

## 2017-08-05 MED ORDER — ONDANSETRON 4 MG PO TBDP: 4.0000 mg | ORAL_TABLET | Freq: Three times a day (TID) | ORAL | 0 refills | Status: DC | PRN

## 2017-08-05 MED ORDER — ROPIVACAINE HCL 5 MG/ML IJ SOLN
INTRAMUSCULAR | Status: DC | PRN
  Administered 2017-08-05: 30 mL via PERINEURAL

## 2017-08-05 MED ORDER — LIDOCAINE-EPINEPHRINE (PF) 2 %-1:200000 IJ SOLN
INTRAMUSCULAR | Status: DC | PRN
  Administered 2017-08-05: 20 mL via PERINEURAL

## 2017-08-05 MED ORDER — PROMETHAZINE HCL 25 MG/ML IJ SOLN
6.2500 mg | INTRAMUSCULAR | Status: DC | PRN
  Administered 2017-08-05: 6.25 mg via INTRAVENOUS
  Filled 2017-08-05: qty 1

## 2017-08-05 MED ORDER — FENTANYL CITRATE (PF) 100 MCG/2ML IJ SOLN
INTRAMUSCULAR | Status: AC
  Filled 2017-08-05: qty 2

## 2017-08-05 MED ORDER — ONDANSETRON HCL 4 MG/2ML IJ SOLN
INTRAMUSCULAR | Status: DC | PRN
  Administered 2017-08-05: 4 mg via INTRAVENOUS

## 2017-08-05 MED ORDER — KETOROLAC TROMETHAMINE 30 MG/ML IJ SOLN
INTRAMUSCULAR | Status: DC | PRN
  Administered 2017-08-05: 30 mg via INTRAVENOUS

## 2017-08-05 MED ORDER — OXYCODONE HCL 5 MG PO TABS
5.0000 mg | ORAL_TABLET | Freq: Once | ORAL | Status: AC | PRN
  Administered 2017-08-05: 5 mg via ORAL
  Filled 2017-08-05: qty 1

## 2017-08-05 MED ORDER — CEFAZOLIN SODIUM-DEXTROSE 2-4 GM/100ML-% IV SOLN
2.0000 g | INTRAVENOUS | Status: AC
  Administered 2017-08-05: 2 g via INTRAVENOUS
  Filled 2017-08-05: qty 100

## 2017-08-05 MED ORDER — PROPOFOL 10 MG/ML IV BOLUS
INTRAVENOUS | Status: DC | PRN
  Administered 2017-08-05: 100 mg via INTRAVENOUS
  Administered 2017-08-05: 300 mg via INTRAVENOUS

## 2017-08-05 MED ORDER — OXYCODONE HCL 5 MG PO TABS
ORAL_TABLET | ORAL | Status: AC
  Filled 2017-08-05: qty 1

## 2017-08-05 MED ORDER — HYDROMORPHONE HCL 1 MG/ML IJ SOLN
INTRAMUSCULAR | Status: AC
  Filled 2017-08-05: qty 1

## 2017-08-05 MED ORDER — LACTATED RINGERS IV SOLN
INTRAVENOUS | Status: DC
  Administered 2017-08-05 (×2): via INTRAVENOUS
  Filled 2017-08-05: qty 1000

## 2017-08-05 MED ORDER — DEXAMETHASONE SODIUM PHOSPHATE 10 MG/ML IJ SOLN
INTRAMUSCULAR | Status: AC
  Filled 2017-08-05: qty 1

## 2017-08-05 MED ORDER — DEXAMETHASONE SODIUM PHOSPHATE 10 MG/ML IJ SOLN
INTRAMUSCULAR | Status: DC | PRN
  Administered 2017-08-05: 10 mg via INTRAVENOUS

## 2017-08-05 MED ORDER — MIDAZOLAM HCL 2 MG/2ML IJ SOLN
INTRAMUSCULAR | Status: AC
  Filled 2017-08-05: qty 2

## 2017-08-05 MED ORDER — WHITE PETROLATUM EX OINT
TOPICAL_OINTMENT | CUTANEOUS | Status: AC
  Filled 2017-08-05: qty 5

## 2017-08-05 MED ORDER — PROPOFOL 10 MG/ML IV BOLUS
INTRAVENOUS | Status: AC
  Filled 2017-08-05: qty 20

## 2017-08-05 MED ORDER — MIDAZOLAM HCL 5 MG/5ML IJ SOLN
INTRAMUSCULAR | Status: DC | PRN
  Administered 2017-08-05: 2 mg via INTRAVENOUS

## 2017-08-05 MED ORDER — CEFAZOLIN SODIUM-DEXTROSE 2-4 GM/100ML-% IV SOLN
INTRAVENOUS | Status: AC
  Filled 2017-08-05: qty 100

## 2017-08-05 MED ORDER — LIDOCAINE 2% (20 MG/ML) 5 ML SYRINGE
INTRAMUSCULAR | Status: AC
  Filled 2017-08-05: qty 5

## 2017-08-05 MED ORDER — METHOCARBAMOL 500 MG PO TABS: 500.0000 mg | ORAL_TABLET | Freq: Four times a day (QID) | ORAL | 0 refills | Status: DC | PRN

## 2017-08-05 MED ORDER — HYDROMORPHONE HCL 1 MG/ML IJ SOLN
0.2500 mg | INTRAMUSCULAR | Status: DC | PRN
  Administered 2017-08-05: 0.5 mg via INTRAVENOUS
  Administered 2017-08-05: 0.25 mg via INTRAVENOUS
  Filled 2017-08-05: qty 0.5

## 2017-08-05 MED ORDER — OXYCODONE HCL 5 MG PO TABS: 5.0000 mg | ORAL_TABLET | ORAL | 0 refills | Status: DC | PRN

## 2017-08-05 MED FILL — oxyCODONE HCL 5 MG TABS: 5 | 3 days supply | Qty: 40 | Fill #0

## 2017-08-05 MED FILL — ZOLPIDEM TART ER 12.5 MG TA: 12.5 | 30 days supply | Qty: 30 | Fill #0

## 2017-08-05 MED FILL — METHOCARBAMOL 500 MG TABS: 500 | 10 days supply | Qty: 40 | Fill #0

## 2017-08-05 MED FILL — ONDANSETRON ODT 4 MG TABLET: 4 | 7 days supply | Qty: 20 | Fill #0

## 2017-08-05 MED FILL — ALPRAZolam 0.5 MG TABS: 0.5 | 10 days supply | Qty: 10 | Fill #1

## 2017-08-05 SURGICAL SUPPLY — 78 items
ALLOGRAFT GRFTLNK IMPLANT SYST (Anchor) ×1 IMPLANT
BANDAGE ACE 6X5 VEL STRL LF (GAUZE/BANDAGES/DRESSINGS) ×2 IMPLANT
BANDAGE ELASTIC 6 VELCRO ST LF (GAUZE/BANDAGES/DRESSINGS) ×2 IMPLANT
BANDAGE ESMARK 6X9 LF (GAUZE/BANDAGES/DRESSINGS) IMPLANT
BLADE 4.2CUDA (BLADE) IMPLANT
BLADE CUDA 5.5 (BLADE) ×2 IMPLANT
BLADE CUDA GRT WHITE 3.5 (BLADE) IMPLANT
BLADE GREAT WHITE 4.2 (BLADE) IMPLANT
BLADE GREAT WHITE SHAVER 5.5 (BLADE) IMPLANT
BLADE SURG 10 STRL SS (BLADE) ×2 IMPLANT
BLADE SURG 15 STRL LF DISP TIS (BLADE) ×1 IMPLANT
BLADE SURG 15 STRL SS (BLADE) ×1
BNDG ESMARK 6X9 LF (GAUZE/BANDAGES/DRESSINGS)
BUR OVAL 4.0 (BURR) IMPLANT
BUR OVAL 6.0 (BURR) IMPLANT
BUR VERTEX HOODED 4.5 (BURR) ×2 IMPLANT
BUTTON EXT TIGHTROPE 5X20 (Orthopedic Implant) ×2 IMPLANT
CANISTER SUCTION 1200CC (MISCELLANEOUS) ×2 IMPLANT
COVER BACK TABLE 60X90IN (DRAPES) ×2 IMPLANT
CUFF TOURNIQUET SINGLE 34IN LL (TOURNIQUET CUFF) ×2 IMPLANT
CUTTER FLIP 10MM (CUTTER) ×2 IMPLANT
CUTTER FLIP 9.5MM (CUTTER) ×2 IMPLANT
DRAPE ARTHROSCOPY W/POUCH 114 (DRAPES) ×2 IMPLANT
DRAPE C-ARM 42X72 X-RAY (DRAPES) IMPLANT
DRAPE INCISE IOBAN 66X45 STRL (DRAPES) IMPLANT
DRAPE LG THREE QUARTER DISP (DRAPES) IMPLANT
DRAPE U-SHAPE 47X51 STRL (DRAPES) ×2 IMPLANT
DURAPREP 26ML APPLICATOR (WOUND CARE) ×2 IMPLANT
ELECT REM PT RETURN 9FT ADLT (ELECTROSURGICAL) ×2
ELECTRODE REM PT RTRN 9FT ADLT (ELECTROSURGICAL) ×1 IMPLANT
FIBERSTICK 2 (SUTURE) IMPLANT
GAUZE SPONGE 4X4 12PLY STRL (GAUZE/BANDAGES/DRESSINGS) ×2 IMPLANT
GAUZE XEROFORM 1X8 LF (GAUZE/BANDAGES/DRESSINGS) ×2 IMPLANT
GLOVE BIO SURGEON STRL SZ7.5 (GLOVE) ×2 IMPLANT
GLOVE INDICATOR 8.0 STRL GRN (GLOVE) ×2 IMPLANT
GOWN STRL REUS W/ TWL XL LVL3 (GOWN DISPOSABLE) ×1 IMPLANT
GOWN STRL REUS W/TWL XL LVL3 (GOWN DISPOSABLE) ×1
IMMOBILIZER KNEE 22 UNIV (SOFTGOODS) IMPLANT
IV NS IRRIG 3000ML ARTHROMATIC (IV SOLUTION) ×10 IMPLANT
KIT TURNOVER CYSTO (KITS) ×2 IMPLANT
KNEE WRAP E Z 3 GEL PACK (MISCELLANEOUS) ×2 IMPLANT
MANIFOLD NEPTUNE II (INSTRUMENTS) ×2 IMPLANT
NEEDLE HYPO 22GX1.5 SAFETY (NEEDLE) IMPLANT
PACK ARTHROSCOPY DSU (CUSTOM PROCEDURE TRAY) ×2 IMPLANT
PACK BASIN DAY SURGERY FS (CUSTOM PROCEDURE TRAY) ×2 IMPLANT
PAD ABD 8X10 STRL (GAUZE/BANDAGES/DRESSINGS) ×2 IMPLANT
PAD ARMBOARD 7.5X6 YLW CONV (MISCELLANEOUS) IMPLANT
PAD CAST 4YDX4 CTTN HI CHSV (CAST SUPPLIES) ×1 IMPLANT
PADDING CAST COTTON 4X4 STRL (CAST SUPPLIES) ×1
PENCIL BUTTON HOLSTER BLD 10FT (ELECTRODE) IMPLANT
PK GRAFTLINK ALLO IMPLANT SYST (Anchor) ×2 IMPLANT
PROBE BIPOLAR ATHRO 135MM 90D (MISCELLANEOUS) IMPLANT
SET ARTHROSCOPY TUBING (MISCELLANEOUS) ×1
SET ARTHROSCOPY TUBING LN (MISCELLANEOUS) ×1 IMPLANT
SHAVER 4.2 MM LANZA 9391A (BLADE) ×2 IMPLANT
SPONGE LAP 4X18 X RAY DECT (DISPOSABLE) ×2 IMPLANT
STRIP CLOSURE SKIN 1/2X4 (GAUZE/BANDAGES/DRESSINGS) ×2 IMPLANT
SUCTION FRAZIER HANDLE 10FR (MISCELLANEOUS) ×1
SUCTION TUBE FRAZIER 10FR DISP (MISCELLANEOUS) ×1 IMPLANT
SUT 2 FIBERLOOP 20 STRT BLUE (SUTURE)
SUT FIBERWIRE #2 38 REV NDL BL (SUTURE)
SUT FIBERWIRE #2 38 T-5 BLUE (SUTURE)
SUT MNCRL AB 3-0 PS2 18 (SUTURE) ×4 IMPLANT
SUT VIC AB 0 CT2 27 (SUTURE) ×2 IMPLANT
SUT VIC AB 2-0 CT2 27 (SUTURE) ×4 IMPLANT
SUTURE 2 FIBERLOOP 20 STRT BLU (SUTURE) IMPLANT
SUTURE FIBERWR #2 38 T-5 BLUE (SUTURE) IMPLANT
SUTURE FIBERWR#2 38 REV NDL BL (SUTURE) IMPLANT
SUTURE TIGERSTICK 2 TIGERWIR 2 (MISCELLANEOUS) IMPLANT
SYR CONTROL 10ML LL (SYRINGE) ×2 IMPLANT
SYSTEM IMPL ACL/PCL SWIVILLOCK (Anchor) ×2 IMPLANT
TAPE STRIPS DRAPE STRL (GAUZE/BANDAGES/DRESSINGS) ×2 IMPLANT
TIGERSTICK 2 TIGERWIRE 2 (MISCELLANEOUS)
TISSUE GRAFTLINK FGL (Tissue) ×2 IMPLANT
TOWEL OR 17X24 6PK STRL BLUE (TOWEL DISPOSABLE) ×4 IMPLANT
TUBE CONNECTING 12X1/4 (SUCTIONS) ×2 IMPLANT
WAND 30 DEG SABER W/CORD (SURGICAL WAND) IMPLANT
WATER STERILE IRR 500ML POUR (IV SOLUTION) ×2 IMPLANT
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
## 2017-08-05 NOTE — Anesthesia Procedure Notes (Signed)
Anesthesia Regional Block: Popliteal block   Pre-Anesthetic Checklist: ,, timeout performed, Correct Patient, Correct Site, Correct Laterality, Correct Procedure, Correct Position, site marked, Risks and benefits discussed,  Surgical consent,  Pre-op evaluation,  At surgeon's request and post-op pain management  Laterality: Right  Prep: chloraprep       Needles:  Injection technique: Single-shot  Needle Type: Echogenic Needle     Needle Length: 9cm      Additional Needles:   Procedures:,,,, ultrasound used (permanent image in chart),,,,  Narrative:  Start time: 08/05/2017 12:20 PM End time: 08/05/2017 12:30 PM Injection made incrementally with aspirations every 5 mL.  Performed by: Personally  Anesthesiologist: Myrtie Soman, MD  Additional Notes: Patient tolerated the procedure well without complications

## 2017-08-05 NOTE — Op Note (Signed)
Surgery Date: 08/05/17   Surgeon(s): Sonia Stairs, MD  ASSIST: Sonia Holden, RNFA  Implants: Arthrex all inside cortical buttons on femur and tibia 4.75 PEEK swivel lock x 1.  Hamstring Allograft, 9.5 mm x 65 mm  ANESTHESIA: general, and adductor block  IV FLUIDS AND URINE: See anesthesia.  TOURNIQUET:  90 minutes  DRAINS: none  COMPLICATIONS: None.   ESTIMATED BLOOD LOSS: minimal  PREOPERATIVE DIAGNOSES:  1.. Right  knee complete ACL rupture  POSTOPERATIVE DIAGNOSES:  1. Right  knee Lateral meniscus tear 2. Right  knee complete ACL rupture  PROCEDURES PERFORMED:  1. Right knee arthroscopy with Hamstring allograftt ACL reconstruction 2.  Partial lateral meniscectomy right knee    DESCRIPTION OF PROCEDURE:Sonia Holden  is a 32 yo Female Complete ACL rupture.  She Sustained this injury following an accident while playing beach volleyball on vacation.  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  After a short period of prehabilitation to allow for return of ROM and quadriceps strenght, we discussed proceeding with arthroscopically assisted hamstring allograft ACL reconstruction. At the time of preoperative counsel and review of the preoperative MRI there was no apparent meniscal pathology..  We reviewed the risks benefits and indications of this procedure including but not limited to bleeding, infection, damage to neurovascular structures, need for future surgery, developed an of arthrosis, rupture of graft, continued instability of the knee, and developement of blood clots and risk of anesthesia.  All questions answered.  The patient was identified in the preoperative holding area and the operative extremity was marked. The patient was brought to the operating room and transferred to operating table in a supine position. Satisfactory general anesthesia was induced by anesthesiology.     Examination under anesthesia revealed a grade 2B Lachman, grade 2 pivot shift, and stable to varus and valgus stress.   At the back table, I nextprepared the allograft To the manufacturer's specifications.  Utilizing the femoral side of the Endobutton as well as the adjustable loop tibial sided fixation.  The graft was put under 15 pounds of tension on the back table.  The femoral socket side measured 9.5 mm.  The tibial side measured 10.0 mm.  Total graft length was 65 mm.  Standard anterolateral, anteromedial arthroscopy portals were obtained. The anteromedial portal was obtained with a spinal needle for localization under direct visualization with subsequent diagnostic findings.   Anteromedial and anterolateral chambers: mild synovitis. The synovitis was debrided with a 4.5 mm full radius shaver through both the anteromedial and lateral portals.   Suprapatellar pouch and gutters: Marked synovitis But no debris. Patella chondral surface: Grade 0 Trochlear chondral surface: Grade 0 Patellofemoral tracking: Midline, no tilt Medial meniscus: Intact, no tear.  Medial femoral condyle flexion bearing surface: Grade 0 Medial femoral condyle extension bearing surface: Grade 0 Medial tibial plateau: Grade 0 Anterior cruciate ligament:Complete femoral sided tear Posterior cruciate ligament:stable Lateral meniscus: White zone posterior horn vertical tear.  Posterior root intact.   Lateral femoral condyle flexion bearing surface: Grade 0 Lateral femoral condyle extension bearing surface: Grade 0 Lateral tibial plateau: Grade 1    Next, The lateral meniscus was inspected closely and found to have a white zone vertical tear about 5 mm lateral to the posterior root attachment.  The straight meniscal biter as well as motorized shaver were used to debride the lateral meniscus tear.  This completed the lateral partial meniscectomy.  We  did also perform a chondroplasty of the lateral tibial  plateau underneath this tear.  This was performed utilizing the motorized shaver as well.  Next, the ACL reconstruction was undertaken. The ACL stump was removed with thermal ablation and shaver and anatomic bony landmarks were marked for the placement of the femoral and tibial sockets.We did utilize a motorized bur to perform a notchplasty.  She had a very stenotic, A-frame type femoral notch.  Arthrex retroguides and Flipcutters were used to create the sockets and perform the procedure by an all-inside GraftLink technique.The femoral socket was created at the inferior portion of the bifurcate ridge of the lateral femoral wall with a size 9.59mm FlipCutter to a depth of 15 mm while the tibial socket was created at the center of the ACL footprint from front to back and toward the base of the medial tibial eminence from medial to lateral, to a depth of 23-25 mm with a 10.40mm FlipCutter. Bony debris was removed and the edges of socket apertures were smoothed. Suture shuttles were used to deliver the graft into the femoral socket first and the tibial socket second. The graft was then secured within the sockets, cinching the self-locking sutures overtop of the proximal and distal cortical buttons with the knee in a reduced position maintained at 20 degrees flexion while a moderate force posterior drawer was applied.After this preliminary tensioning, the knee was placed through several flexion-extension cycles to eliminate any graft settling or excursion and the graft was re-tensioned in the same manner and the sutures were tied over top of the buttons proximally and distally, and the four tibial sided suture arms were secondarily secured at the proximal tibia with 1SwiveLock anchor.Of note we did also pass a free labral tape through the ACL fixation as an separate internal brace backup fixation.  Final images of the ACL graft were obtained, revealing no lateral wall or roof impingement of the graft at the  notch through range of motion.Stability of the ACL graft was assessed and found to be normalized at grade 0 lachman and grade 0 pivot shift.   The wounds were all closed in layers per usual.Dressings were applied and a brace placed And locked in 0 of flexion..There were no apparent complications.The patient was awakened and taken to recovery room in satisfactory condition.  POSTOPERATIVE PLAN:Sonia Holden will be touch down weight bearing on crutches until cleared by The therapist. They will likewise be in The knee brace with it locked until quad function is normalized. They will be on 325 mg asa BID for 1 month and TED hose for DVT PPX. they will return to the clinic to see the surgeon in 2 weeks.  Sonia Holden

## 2017-08-05 NOTE — Anesthesia Procedure Notes (Signed)
Anesthesia Procedure Image    

## 2017-08-05 NOTE — Transfer of Care (Signed)
Immediate Anesthesia Transfer of Care Note  Patient: Sonia Holden  Procedure(s) Performed: RIGHT KNEE ARTHROSCOPIC ANTERIOR CRUCIATE LIGAMENT (ACL) RECONSTRUCTION WITH HAMSTRING ALLOGRAFT (Right Knee)  Patient Location: PACU  Anesthesia Type:General  Level of Consciousness: awake, alert  and oriented  Airway & Oxygen Therapy: Patient Spontanous Breathing and Patient connected to nasal cannula oxygen  Post-op Assessment: Report given to RN  Post vital signs: Reviewed and stable  Last Vitals: 122/78, 105, 13, 95%, 98.0 Vitals:   08/05/17 1102  BP: 114/71  Pulse: 94  Resp: 18  Temp: 36.5 C  SpO2: 98%    Last Pain:  Vitals:   08/05/17 1102  TempSrc: Oral      Patients Stated Pain Goal: 6 (59/47/07 6151)  Complications: No apparent anesthesia complications

## 2017-08-05 NOTE — Anesthesia Preprocedure Evaluation (Addendum)
Anesthesia Evaluation  Patient identified by MRN, date of birth, ID band Patient awake    Reviewed: Allergy & Precautions, NPO status , Patient's Chart, lab work & pertinent test results  History of Anesthesia Complications (+) PONV  Airway Mallampati: II  TM Distance: >3 FB Neck ROM: Full    Dental no notable dental hx.    Pulmonary Current Smoker,    Pulmonary exam normal breath sounds clear to auscultation       Cardiovascular negative cardio ROS Normal cardiovascular exam Rhythm:Regular Rate:Normal     Neuro/Psych negative neurological ROS  negative psych ROS   GI/Hepatic negative GI ROS, Neg liver ROS,   Endo/Other  Morbid obesity  Renal/GU negative Renal ROS  negative genitourinary   Musculoskeletal negative musculoskeletal ROS (+)   Abdominal   Peds negative pediatric ROS (+)  Hematology negative hematology ROS (+)   Anesthesia Other Findings   Reproductive/Obstetrics negative OB ROS                            Anesthesia Physical Anesthesia Plan  ASA: II  Anesthesia Plan: General   Post-op Pain Management:  Regional for Post-op pain   Induction: Intravenous  PONV Risk Score and Plan: 3 and Ondansetron, Dexamethasone, Midazolam and Treatment may vary due to age or medical condition  Airway Management Planned: LMA  Additional Equipment:   Intra-op Plan:   Post-operative Plan: Extubation in OR  Informed Consent: I have reviewed the patients History and Physical, chart, labs and discussed the procedure including the risks, benefits and alternatives for the proposed anesthesia with the patient or authorized representative who has indicated his/her understanding and acceptance.   Dental advisory given  Plan Discussed with: CRNA and Surgeon  Anesthesia Plan Comments:         Anesthesia Quick Evaluation

## 2017-08-05 NOTE — Brief Op Note (Signed)
08/05/2017  3:06 PM  PATIENT:  Sonia Holden  32 y.o. female  PRE-OPERATIVE DIAGNOSIS:  Right knee ACL tear  POST-OPERATIVE DIAGNOSIS:  Right knee ACL tear, lateral meniscus tear  PROCEDURE:  Procedure(s): RIGHT KNEE ARTHROSCOPIC ANTERIOR CRUCIATE LIGAMENT (ACL) RECONSTRUCTION WITH HAMSTRING ALLOGRAFT (Right) Right knee arthroscopic partial lateral meniscectomy  SURGEON:  Surgeon(s) and Role:    * Nicholes Stairs, MD - Primary  PHYSICIAN ASSISTANT:   ASSISTANTS: Rhodia Albright, RNFA.  ANESTHESIA:   General with abductor canal block  EBL:  25 mL   BLOOD ADMINISTERED:none  DRAINS: none   LOCAL MEDICATIONS USED:  NONE  SPECIMEN:  No Specimen  DISPOSITION OF SPECIMEN:  N/A  COUNTS:  YES  TOURNIQUET:   Total Tourniquet Time Documented: Thigh (Right) - 95 minutes Total: Thigh (Right) - 95 minutes   DICTATION: .Note written in EPIC  PLAN OF CARE: Discharge to home after PACU  PATIENT DISPOSITION:  PACU - hemodynamically stable.   Delay start of Pharmacological VTE agent (>24hrs) due to surgical blood loss or risk of bleeding: not applicable

## 2017-08-05 NOTE — Discharge Instructions (Signed)
Orthopedic discharge instructions:  -Maintain touchdown weightbearing to the right lower extremity until you are advanced by her physical therapist. -Your brace should remain on at all times unless removing to shower. -Maintain postoperative bandages for 3 days.  On the fourth postoperative day you may remove all bandages and begin showering at that point time.  Pat dry and cover back with Ace bandages post shower. -Should begin physical therapy within 1 week of the surgery.  They will direct you asked to your home exercise regimen. -Wear your Ted compression stockings at all times for the first 2 weeks postoperatively. -For the prevention of blood clots take a full-strength aspirin twice daily for 6 weeks. -You may take Tylenol and/or ibuprofen for mild pain and utilize oxycodone as needed for moderate to severe pain.  Post Anesthesia Home Care Instructions  Activity: Get plenty of rest for the remainder of the day. A responsible individual must stay with you for 24 hours following the procedure.  For the next 24 hours, DO NOT: -Drive a car -Paediatric nurse -Drink alcoholic beverages -Take any medication unless instructed by your physician -Make any legal decisions or sign important papers.  Meals: Start with liquid foods such as gelatin or soup. Progress to regular foods as tolerated. Avoid greasy, spicy, heavy foods. If nausea and/or vomiting occur, drink only clear liquids until the nausea and/or vomiting subsides. Call your physician if vomiting continues.  Special Instructions/Symptoms: Your throat may feel dry or sore from the anesthesia or the breathing tube placed in your throat during surgery. If this causes discomfort, gargle with warm salt water. The discomfort should disappear within 24 hours.  If you had a scopolamine patch placed behind your ear for the management of post- operative nausea and/or vomiting:  1. The medication in the patch is effective for 72 hours, after  which it should be removed.  Wrap patch in a tissue and discard in the trash. Wash hands thoroughly with soap and water. 2. You may remove the patch earlier than 72 hours if you experience unpleasant side effects which may include dry mouth, dizziness or visual disturbances. 3. Avoid touching the patch. Wash your hands with soap and water after contact with the patch.   Regional Anesthesia Blocks  1. Numbness or the inability to move the "blocked" extremity may last from 3-48 hours after placement. The length of time depends on the medication injected and your individual response to the medication. If the numbness is not going away after 48 hours, call your surgeon.  2. The extremity that is blocked will need to be protected until the numbness is gone and the  Strength has returned. Because you cannot feel it, you will need to take extra care to avoid injury. Because it may be weak, you may have difficulty moving it or using it. You may not know what position it is in without looking at it while the block is in effect.  3. For blocks in the legs and feet, returning to weight bearing and walking needs to be done carefully. You will need to wait until the numbness is entirely gone and the strength has returned. You should be able to move your leg and foot normally before you try and bear weight or walk. You will need someone to be with you when you first try to ensure you do not fall and possibly risk injury.  4. Bruising and tenderness at the needle site are common side effects and will resolve in a few days.  5. Persistent numbness or new problems with movement should be communicated to the surgeon or the [place] [phone] [place] [phone]).

## 2017-08-05 NOTE — Anesthesia Postprocedure Evaluation (Signed)
Anesthesia Post Note  Patient: Sonia Holden  Procedure(s) Performed: RIGHT KNEE ARTHROSCOPIC ANTERIOR CRUCIATE LIGAMENT (ACL) RECONSTRUCTION WITH HAMSTRING ALLOGRAFT (Right Knee)     Patient location during evaluation: PACU Anesthesia Type: General Level of consciousness: sedated Pain management: pain level controlled Vital Signs Assessment: post-procedure vital signs reviewed and stable Respiratory status: spontaneous breathing and respiratory function stable Cardiovascular status: stable Postop Assessment: no apparent nausea or vomiting Anesthetic complications: yes Anesthetic complication details: PONV   Last Vitals:  Vitals:   08/05/17 1600 08/05/17 1615  BP: 119/80 123/83  Pulse: 94 93  Resp: 14 11  Temp:    SpO2: 97% 97%    Last Pain:  Vitals:   08/05/17 1554  TempSrc:   PainSc: 7                  Paco Cislo DANIEL

## 2017-08-05 NOTE — Anesthesia Procedure Notes (Signed)
Anesthesia Regional Block: Femoral nerve block   Pre-Anesthetic Checklist: ,, timeout performed, Correct Patient, Correct Site, Correct Laterality, Correct Procedure, Correct Position, site marked, Risks and benefits discussed,  Surgical consent,  Pre-op evaluation,  At surgeon's request and post-op pain management  Laterality: Right  Prep: chloraprep       Needles:  Injection technique: Single-shot  Needle Type: Echogenic Needle     Needle Length: 9cm      Additional Needles:   Procedures:,,,, ultrasound used (permanent image in chart),,,,  Narrative:  Start time: 08/05/2017 12:20 PM End time: 08/05/2017 12:30 PM Injection made incrementally with aspirations every 5 mL.  Performed by: Personally  Anesthesiologist: Myrtie Soman, MD  Additional Notes: Patient tolerated the procedure well without complications

## 2017-08-05 NOTE — Anesthesia Procedure Notes (Signed)
Procedure Name: LMA Insertion Date/Time: 08/05/2017 12:43 PM Performed by: Bonney Aid, CRNA Pre-anesthesia Checklist: Patient identified, Emergency Drugs available, Suction available and Patient being monitored Patient Re-evaluated:Patient Re-evaluated prior to induction Oxygen Delivery Method: Circle system utilized Preoxygenation: Pre-oxygenation with 100% oxygen Induction Type: IV induction Ventilation: Mask ventilation without difficulty LMA: LMA inserted LMA Size: 4.0 Number of attempts: 1 Airway Equipment and Method: Bite block Placement Confirmation: positive ETCO2 Tube secured with: Tape Dental Injury: Teeth and Oropharynx as per pre-operative assessment

## 2017-08-06 ENCOUNTER — Encounter (HOSPITAL_BASED_OUTPATIENT_CLINIC_OR_DEPARTMENT_OTHER): Payer: Self-pay | Admitting: Orthopedic Surgery

## 2017-08-12 DIAGNOSIS — M25561 Pain in right knee: Secondary | ICD-10-CM | POA: Diagnosis not present

## 2017-08-12 MED FILL — oxyCODONE HCL 5 MG TABS: 5 | 9 days supply | Qty: 50 | Fill #0

## 2017-08-15 DIAGNOSIS — M25561 Pain in right knee: Secondary | ICD-10-CM | POA: Diagnosis not present

## 2017-08-18 DIAGNOSIS — M25561 Pain in right knee: Secondary | ICD-10-CM | POA: Diagnosis not present

## 2017-08-18 MED FILL — NUVARING VAGINAL RING: 0.12-0.015 | 84 days supply | Qty: 3 | Fill #0

## 2017-08-19 MED FILL — DOXEPIN 150 MG CAPSULE: 150 | 30 days supply | Qty: 30 | Fill #3

## 2017-08-20 MED FILL — CYCLOBENZAPRINE 10 MG TAB: 10 | 10 days supply | Qty: 30 | Fill #1

## 2017-08-21 DIAGNOSIS — M25561 Pain in right knee: Secondary | ICD-10-CM | POA: Diagnosis not present

## 2017-08-25 DIAGNOSIS — M25561 Pain in right knee: Secondary | ICD-10-CM | POA: Diagnosis not present

## 2017-08-26 ENCOUNTER — Ambulatory Visit (INDEPENDENT_AMBULATORY_CARE_PROVIDER_SITE_OTHER): Payer: 59 | Admitting: Internal Medicine

## 2017-08-26 ENCOUNTER — Encounter: Payer: Self-pay | Admitting: Internal Medicine

## 2017-08-26 VITALS — BP 100/66 | HR 97 | Temp 97.7°F | Resp 15 | Ht 66.5 in | Wt 238.6 lb

## 2017-08-26 DIAGNOSIS — E781 Pure hyperglyceridemia: Secondary | ICD-10-CM

## 2017-08-26 DIAGNOSIS — Z0000 Encounter for general adult medical examination without abnormal findings: Secondary | ICD-10-CM

## 2017-08-26 DIAGNOSIS — E6609 Other obesity due to excess calories: Secondary | ICD-10-CM | POA: Diagnosis not present

## 2017-08-26 DIAGNOSIS — Z6835 Body mass index (BMI) 35.0-35.9, adult: Secondary | ICD-10-CM | POA: Diagnosis not present

## 2017-08-26 DIAGNOSIS — R7301 Impaired fasting glucose: Secondary | ICD-10-CM

## 2017-08-26 NOTE — Progress Notes (Signed)
Patient ID: Sonia Holden, female    DOB: 07-Mar-1986  Age: 32 y.o. MRN: [id number]  The patient is here for annual preventive examination and management of other chronic and acute problems.    Last PAP Oct 2018 by Sonia Holden , done twice annually along with breast exam and mammograms due to [facility] of breast [place]    Lipids high trigs > 500 last check June   The risk factors are reflected in the social history.  The roster of all physicians providing medical care to patient - is listed in the Snapshot section of the chart.  Activities of daily living:  The patient is 100% independent in all ADLs: dressing, toileting, feeding as well as independent mobility  Home safety : The patient has smoke detectors in the home. They wear seatbelts.  There are no firearms at home. There is no violence in the home.   There is no risks for hepatitis, STDs or HIV. There is no   history of blood transfusion. They have no travel history to infectious disease endemic areas of the world.  The patient has seen their dentist in the last six month. They have seen their eye doctor in the last year. They admit to slight hearing difficulty with regard to whispered voices and some television programs.  They have deferred audiologic testing in the last year.  They do not  have excessive sun exposure. Discussed the need for sun protection: hats, long sleeves and use of sunscreen if there is significant sun exposure.   Diet: the importance of a healthy diet is discussed. They do have a healthy diet.  Has topped smoking since surgery ,  3 weeks,  Dong the KETO diet.   The benefits of regular aerobic exercise were discussed. She walks 4 times per week ,  20 minutes.   Depression screen: there are no signs or vegative symptoms of depression- irritability, change in appetite, anhedonia, sadness/tearfullness.  Cognitive assessment: the patient manages all their financial and personal affairs and is actively engaged. They  could relate day,date,year and events; recalled 2/3 objects at 3 minutes; performed clock-face test normally.  The following portions of the patient's history were reviewed and updated as appropriate: allergies, current medications, past family history, past medical history,  past surgical history, past social history  and problem list.  Visual acuity was not assessed per patient preference since she has regular follow up with her ophthalmologist. Hearing and body mass index were assessed and reviewed.   During the course of the visit the patient was educated and counseled about appropriate screening and preventive services including : fall prevention , diabetes screening, nutrition counseling, colorectal cancer screening, and recommended immunizations.    CC: The primary encounter diagnosis was Hypertriglyceridemia. Diagnoses of Impaired fasting glucose, Class 2 obesity due to excess calories without serious comorbidity with body mass index (BMI) of 35.0 to 35.9 in adult, and Encounter for preventive health examination were also pertinent to this visit.   Obesity:  11 lb weight  Gain. recovering from surgery.   Right knee arthroscopic surgery March 5 for ACL tear  .  Receiving PT 3/week , painful wearing brace .  Has run out of pain medications ,   getting Oxy IR from orthopedist.  Constipation managed with miralax and  Colace.   history Sonia Holden has a past medical history of ADHD (attention deficit hyperactivity disorder), Chronic insomnia, Depression, Hypertriglyceridemia, Low back pain, Obese, Panic attacks, PONV (postoperative nausea and vomiting), Respiratory failure after  trauma ([place]) (02/06/2009), and Wrist fracture, bilateral.   She has a past surgical history that includes Nose surgery; ORIF metacarpal fracture (Left, 03/28/2009); Wrist fracture surgery (Right); Tonsillectomy and adenoidectomy; and Anterior cruciate ligament repair (Right, 08/05/2017).   Her family history includes  Arthritis in her father; Cancer in her mother; Depression in her mother; Hyperlipidemia in her father; Kidney disease in her father.She reports that she has been smoking cigarettes.  She has a 8.00 pack-year smoking history. She has never used smokeless tobacco. She reports that she drinks alcohol. She reports that she does not use drugs.  Outpatient Medications Prior to Visit  Medication Sig Dispense Refill  . ALPRAZolam (XANAX) 1 MG tablet Take 1 tablet (1 mg total) by mouth 2 (two) times daily as needed for sleep. 60 tablet 0  . amphetamine-dextroamphetamine (ADDERALL) 10 MG tablet Take 10 mg by mouth 2 (two) times daily.    . Biotin 1 MG CAPS biotin    . cyclobenzaprine (FLEXERIL) 10 MG tablet Take 1 tablet (10 mg total) by mouth 3 (three) times daily. (Patient taking differently: Take 10 mg by mouth as needed. ) 30 tablet 0  . doxepin (SINEQUAN) 150 MG capsule Take 1 capsule (150 mg total) by mouth at bedtime.    Marland Kitchen ibuprofen (ADVIL,MOTRIN) 800 MG tablet Take 1 tablet (800 mg total) by mouth every 8 (eight) hours as needed. 90 tablet 5  . methocarbamol (ROBAXIN) 500 MG tablet Take 1 tablet (500 mg total) by mouth every 6 (six) hours as needed for muscle spasms. 40 tablet 0  . Multiple Vitamin (MULTIVITAMIN) tablet Take 1 tablet by mouth daily.    Marland Kitchen NUVARING 0.12-0.015 MG/24HR vaginal ring   4  . ondansetron (ZOFRAN ODT) 4 MG disintegrating tablet Take 1 tablet (4 mg total) by mouth every 8 (eight) hours as needed for nausea or vomiting. 20 tablet 0  . oxyCODONE (ROXICODONE) 5 MG immediate release tablet Take 1-2 tablets (5-10 mg total) by mouth every 4 (four) hours as needed for moderate pain or severe pain. 40 tablet 0  . traMADol (ULTRAM) 50 MG tablet TAKE 1 TABLET BY MOUTH EVERY 6 HOURS AS NEEDED FOR PAIN 90 tablet 5  . valACYclovir (VALTREX) 500 MG tablet Take 1 tablet (500 mg total) by mouth 2 (two) times daily. 14 tablet 3  . Zinc 10 MG LOZG zinc    . zolpidem (AMBIEN CR) 12.5 MG CR  tablet Take 12.5 mg by mouth at bedtime as needed for sleep.    . B-COMPLEX-C PO B-Complex    . HYDROcodone-acetaminophen (NORCO/VICODIN) 5-325 MG tablet Take 1 tablet by mouth every 6 (six) hours as needed for severe pain. (Patient not taking: Reported on 08/26/2017) 10 tablet 0   No facility-administered medications prior to visit.     Review of Systems   Patient denies headache, fevers, malaise, unintentional weight loss, skin rash, eye pain, sinus congestion and sinus pain, sore throat, dysphagia,  hemoptysis , cough, dyspnea, wheezing, chest pain, palpitations, orthopnea, edema, abdominal pain, nausea, melena, diarrhea, constipation, flank pain, dysuria, hematuria, urinary  Frequency, nocturia, numbness, tingling, seizures,  Focal weakness, Loss of consciousness,  Tremor, insomnia, depression, anxiety, and suicidal ideation.     Objective:  BP 100/66 (BP Location: Left Arm, Patient Position: Sitting, Cuff Size: Large)   Pulse 97   Temp 97.7 F (36.5 C) (Oral)   Resp 15   Ht 5' 6.5" (1.689 m)   Wt 238 lb 9.6 oz (108.2 kg)   LMP  (  LMP Unknown) Comment: due to nuvaring  SpO2 97%   BMI 37.93 kg/m   Physical Exam   General appearance: alert, cooperative and appears stated age Ears: normal TM's and external ear canals both ears Throat: lips, mucosa, and tongue normal; teeth and gums normal Neck: no adenopathy, no carotid bruit, supple, symmetrical, trachea midline and thyroid not enlarged, symmetric, no tenderness/mass/nodules Back: symmetric, no curvature. ROM normal. No CVA tenderness. Lungs: clear to auscultation bilaterally Heart: regular rate and rhythm, S1, S2 normal, no murmur, click, rub or gallop Abdomen: soft, non-tender; bowel sounds normal; no masses,  no organomegaly Pulses: 2+ and symmetric Skin: Skin color, texture, turgor normal. No rashes or lesions Lymph nodes: Cervical, supraclavicular, and axillary nodes normal.    Assessment & Plan:   Problem List Items  Addressed This Visit    Obesity   Hypertriglyceridemia - Primary    Noted on nonfasting labs previously Low GI diet recommended.  Return for fasting labs  Lab Results  Component Value Date   CHOL 211 (H) 11/13/2016   HDL 37.10 (L) 11/13/2016   LDLCALC 102 (H) 06/30/2014   LDLDIRECT 114.0 11/13/2016   TRIG (H) 11/13/2016    529.0 Triglyceride is over 400; calculations on Lipids are invalid.   CHOLHDL 6 11/13/2016         Relevant Orders   TSH   Encounter for preventive health examination    Annual comprehensive preventive exam was done as well as an evaluation and management of chronic conditions .  During the course of the visit the patient was educated and counseled about appropriate screening and preventive services including :  diabetes screening, lipid analysis with projected  10 year  risk for CAD , nutrition counseling, breast, cervical and colorectal cancer screening, and recommended immunizations.  Printed recommendations for health maintenance screenings was given       Other Visit Diagnoses    Impaired fasting glucose       Relevant Orders   Lipid panel   Comprehensive metabolic panel   Hemoglobin A1c      I have discontinued Marijean Bravo. Barbour's B-COMPLEX-C PO and HYDROcodone-acetaminophen. I am also having her maintain her amphetamine-dextroamphetamine, multivitamin, ALPRAZolam, Biotin, NUVARING, Zinc, ibuprofen, doxepin, valACYclovir, traMADol, cyclobenzaprine, zolpidem, methocarbamol, ondansetron, and oxyCODONE.  No orders of the defined types were placed in this encounter.   Medications Discontinued During This Encounter  Medication Reason  . B-COMPLEX-C PO Patient has not taken in last 30 days  . HYDROcodone-acetaminophen (NORCO/VICODIN) 5-325 MG tablet Patient has not taken in last 30 days    Follow-up: Return in about 1 month (around 09/23/2017).   Crecencio Mc, MD

## 2017-08-26 NOTE — Patient Instructions (Signed)
Try the "headspace " application  on your I phone to help you relax and meditate  Return for fasting labs when you are able.     The  diet I discussed with you today is the 10 day Green Smoothie Cleansing /Detox Diet by Linden Dolin . available on [place] for around $10.  It does require a blender, (Vita Mix, a electric juicer,  Or a Nutribullet Rx).  This is not a low carb or a weight loss diet,  It is fundamentally a "cleansing" low fat diet that eliminates sugar, gluten, caffeine, alcohol and dairy for 10 days .  What you add back after the initial ten days is entirely up to  you!  You can expect to lose 5 to 10 lbs depending on how strict you are. ]

## 2017-08-27 DIAGNOSIS — Z0000 Encounter for general adult medical examination without abnormal findings: Secondary | ICD-10-CM | POA: Insufficient documentation

## 2017-08-27 DIAGNOSIS — M25561 Pain in right knee: Secondary | ICD-10-CM | POA: Diagnosis not present

## 2017-08-27 HISTORY — DX: Encounter for general adult medical examination without abnormal findings: Z00.00

## 2017-08-27 NOTE — Assessment & Plan Note (Signed)
Noted on nonfasting labs previously Low GI diet recommended.  Return for fasting labs  Lab Results  Component Value Date   CHOL 211 (H) 11/13/2016   HDL 37.10 (L) 11/13/2016   LDLCALC 102 (H) 06/30/2014   LDLDIRECT 114.0 11/13/2016   TRIG (H) 11/13/2016    529.0 Triglyceride is over 400; calculations on Lipids are invalid.   CHOLHDL 6 11/13/2016

## 2017-08-27 NOTE — Assessment & Plan Note (Signed)
Annual comprehensive preventive exam was done as well as an evaluation and management of chronic conditions .  During the course of the visit the patient was educated and counseled about appropriate screening and preventive services including :  diabetes screening, lipid analysis with projected  10 year  risk for CAD , nutrition counseling, breast, cervical and colorectal cancer screening, and recommended immunizations.  Printed recommendations for health maintenance screenings was given 

## 2017-08-28 MED FILL — oxyCODONE HCL 5 MG TABS: 5 | 8 days supply | Qty: 50 | Fill #0

## 2017-08-29 DIAGNOSIS — M25561 Pain in right knee: Secondary | ICD-10-CM | POA: Diagnosis not present

## 2017-09-01 DIAGNOSIS — M25561 Pain in right knee: Secondary | ICD-10-CM | POA: Diagnosis not present

## 2017-09-04 DIAGNOSIS — M25561 Pain in right knee: Secondary | ICD-10-CM | POA: Diagnosis not present

## 2017-09-05 MED FILL — DEXTROAMP-AMPHETAMIN 15 MG: 15 | 30 days supply | Qty: 60 | Fill #0

## 2017-09-05 MED FILL — ZOLPIDEM TART ER 12.5 MG TA: 12.5 | 30 days supply | Qty: 30 | Fill #1

## 2017-09-08 DIAGNOSIS — M25561 Pain in right knee: Secondary | ICD-10-CM | POA: Diagnosis not present

## 2017-09-10 DIAGNOSIS — M25561 Pain in right knee: Secondary | ICD-10-CM | POA: Diagnosis not present

## 2017-09-10 MED FILL — oxyCODONE HCL 5 MG TABS: 5 | 7 days supply | Qty: 40 | Fill #0

## 2017-09-16 MED FILL — CEPHALEXIN 500 MG CAPSULE: 500 | 7 days supply | Qty: 21 | Fill #0

## 2017-09-16 MED FILL — ALPRAZolam 0.5 MG TABS: 0.5 | 10 days supply | Qty: 10 | Fill #0

## 2017-09-16 MED FILL — HYDROCODON-APAP 5-325: 5-325 | 9 days supply | Qty: 35 | Fill #0

## 2017-09-18 DIAGNOSIS — M25561 Pain in right knee: Secondary | ICD-10-CM | POA: Diagnosis not present

## 2017-09-22 DIAGNOSIS — M25561 Pain in right knee: Secondary | ICD-10-CM | POA: Diagnosis not present

## 2017-09-22 MED FILL — DOXEPIN 75 MG CAPSULE: 75 | 30 days supply | Qty: 60 | Fill #0

## 2017-09-29 ENCOUNTER — Encounter: Payer: Self-pay | Admitting: Internal Medicine

## 2017-09-30 DIAGNOSIS — M25561 Pain in right knee: Secondary | ICD-10-CM | POA: Diagnosis not present

## 2017-10-02 DIAGNOSIS — M25561 Pain in right knee: Secondary | ICD-10-CM | POA: Diagnosis not present

## 2017-10-02 MED FILL — CYCLOBENZAPRINE 10 MG TAB: 10 | 10 days supply | Qty: 30 | Fill #2

## 2017-10-02 MED FILL — VALACYCLOVIR HCL 500 MG TAB: 500 | 7 days supply | Qty: 14 | Fill #1

## 2017-10-02 MED FILL — IBUPROFEN 800 MG TAB: 800 | 30 days supply | Qty: 90 | Fill #2

## 2017-10-07 DIAGNOSIS — M25561 Pain in right knee: Secondary | ICD-10-CM | POA: Diagnosis not present

## 2017-10-07 MED FILL — ZOLPIDEM TART ER 12.5 MG TA: 12.5 | 30 days supply | Qty: 30 | Fill #2

## 2017-10-09 DIAGNOSIS — M25561 Pain in right knee: Secondary | ICD-10-CM | POA: Diagnosis not present

## 2017-10-17 DIAGNOSIS — M25561 Pain in right knee: Secondary | ICD-10-CM | POA: Diagnosis not present

## 2017-10-20 MED FILL — ALPRAZolam 0.5 MG TABS: 0.5 | 10 days supply | Qty: 10 | Fill #1

## 2017-10-21 DIAGNOSIS — M25561 Pain in right knee: Secondary | ICD-10-CM | POA: Diagnosis not present

## 2017-10-23 DIAGNOSIS — M25561 Pain in right knee: Secondary | ICD-10-CM | POA: Diagnosis not present

## 2017-10-23 MED FILL — DOXEPIN 75 MG CAPSULE: 75 | 30 days supply | Qty: 60 | Fill #1

## 2017-10-23 MED FILL — HYDROCODON-APAP 5-325: 5-325 | 8 days supply | Qty: 30 | Fill #0

## 2017-10-30 DIAGNOSIS — M25561 Pain in right knee: Secondary | ICD-10-CM | POA: Diagnosis not present

## 2017-11-04 DIAGNOSIS — M25561 Pain in right knee: Secondary | ICD-10-CM | POA: Diagnosis not present

## 2017-11-04 MED FILL — ZOLPIDEM TART ER 12.5 MG TA: 12.5 | 30 days supply | Qty: 30 | Fill #3

## 2017-11-04 MED FILL — CYCLOBENZAPRINE 10 MG TAB: 10 | 10 days supply | Qty: 30 | Fill #3

## 2017-11-11 DIAGNOSIS — M25561 Pain in right knee: Secondary | ICD-10-CM | POA: Diagnosis not present

## 2017-11-12 DIAGNOSIS — F329 Major depressive disorder, single episode, unspecified: Secondary | ICD-10-CM | POA: Diagnosis not present

## 2017-11-12 DIAGNOSIS — F4000 Agoraphobia, unspecified: Secondary | ICD-10-CM | POA: Diagnosis not present

## 2017-11-12 DIAGNOSIS — F919 Conduct disorder, unspecified: Secondary | ICD-10-CM | POA: Diagnosis not present

## 2017-11-13 DIAGNOSIS — M25561 Pain in right knee: Secondary | ICD-10-CM | POA: Diagnosis not present

## 2017-11-14 MED FILL — DEXTROAMP-AMPHETAMIN 15 MG: 15 | 30 days supply | Qty: 60 | Fill #0

## 2017-11-17 DIAGNOSIS — M25561 Pain in right knee: Secondary | ICD-10-CM | POA: Diagnosis not present

## 2017-11-20 DIAGNOSIS — M25561 Pain in right knee: Secondary | ICD-10-CM | POA: Diagnosis not present

## 2017-11-24 DIAGNOSIS — S83511D Sprain of anterior cruciate ligament of right knee, subsequent encounter: Secondary | ICD-10-CM | POA: Diagnosis not present

## 2017-11-24 MED FILL — DOXEPIN 75 MG CAPSULE: 75 | 30 days supply | Qty: 60 | Fill #2

## 2017-11-24 MED FILL — ALPRAZolam 0.5 MG TABS: 0.5 | 10 days supply | Qty: 10 | Fill #2

## 2017-12-05 MED FILL — ZOLPIDEM TART ER 12.5 MG TA: 12.5 | 30 days supply | Qty: 30 | Fill #0

## 2017-12-05 MED FILL — CYCLOBENZAPRINE 10 MG TAB: 10 | 10 days supply | Qty: 30 | Fill #4

## 2017-12-19 MED FILL — NUVARING VAGINAL RING: 0.12-0.015 | 84 days supply | Qty: 3 | Fill #1

## 2017-12-19 MED FILL — DEXTROAMP-AMPHETAMIN 15 MG: 15 | 30 days supply | Qty: 60 | Fill #0

## 2017-12-23 ENCOUNTER — Other Ambulatory Visit: Payer: Self-pay | Admitting: Internal Medicine

## 2017-12-23 DIAGNOSIS — M9905 Segmental and somatic dysfunction of pelvic region: Secondary | ICD-10-CM | POA: Diagnosis not present

## 2017-12-23 DIAGNOSIS — M9904 Segmental and somatic dysfunction of sacral region: Secondary | ICD-10-CM | POA: Diagnosis not present

## 2017-12-23 DIAGNOSIS — M545 Low back pain: Secondary | ICD-10-CM | POA: Diagnosis not present

## 2017-12-23 DIAGNOSIS — Z760 Encounter for issue of repeat prescription: Secondary | ICD-10-CM

## 2017-12-23 DIAGNOSIS — M9903 Segmental and somatic dysfunction of lumbar region: Secondary | ICD-10-CM | POA: Diagnosis not present

## 2017-12-23 NOTE — Telephone Encounter (Signed)
Refilled: 11/13/2016 Last OV: 08/26/2017 Next OV: 12/26/2017

## 2017-12-23 NOTE — Telephone Encounter (Signed)
Copied from [place] [id number]. Topic: Quick Communication - Rx Refill/Question >> Dec 23, 2017  9:51 AM Yvette Rack wrote: Medication: traMADol (ULTRAM) 50 MG tablet  Has the patient contacted their pharmacy? Yes    Preferred Pharmacy (with phone number or street name): [place], [place]. [phone] (Phone) [phone] (Fax)   Agent: Please be advised that RX refills may take up to 3 business days. We ask that you follow-up with your pharmacy.

## 2017-12-24 NOTE — Telephone Encounter (Signed)
Refill of Tramadol  LOV 08/26/17 Dr. Derrel Nip  [facility] 11/13/16  #90  5 refills  : [place], [name] - [address].     [phone] (Phone) [phone] (Fax)

## 2017-12-24 NOTE — Telephone Encounter (Signed)
rx request 

## 2017-12-25 DIAGNOSIS — M9903 Segmental and somatic dysfunction of lumbar region: Secondary | ICD-10-CM | POA: Diagnosis not present

## 2017-12-25 DIAGNOSIS — M545 Low back pain: Secondary | ICD-10-CM | POA: Diagnosis not present

## 2017-12-25 DIAGNOSIS — M9905 Segmental and somatic dysfunction of pelvic region: Secondary | ICD-10-CM | POA: Diagnosis not present

## 2017-12-25 DIAGNOSIS — M9904 Segmental and somatic dysfunction of sacral region: Secondary | ICD-10-CM | POA: Diagnosis not present

## 2017-12-26 ENCOUNTER — Other Ambulatory Visit: Payer: 59

## 2017-12-26 ENCOUNTER — Ambulatory Visit: Payer: 59 | Admitting: Internal Medicine

## 2017-12-26 MED FILL — traMADol HCL 50 MG TABS: 50 | 23 days supply | Qty: 90 | Fill #0

## 2017-12-30 DIAGNOSIS — Z808 Family history of malignant neoplasm of other organs or systems: Secondary | ICD-10-CM | POA: Diagnosis not present

## 2017-12-30 DIAGNOSIS — M545 Low back pain: Secondary | ICD-10-CM | POA: Diagnosis not present

## 2017-12-30 DIAGNOSIS — Z803 Family history of malignant neoplasm of breast: Secondary | ICD-10-CM | POA: Diagnosis not present

## 2017-12-30 DIAGNOSIS — M9905 Segmental and somatic dysfunction of pelvic region: Secondary | ICD-10-CM | POA: Diagnosis not present

## 2017-12-30 DIAGNOSIS — M9903 Segmental and somatic dysfunction of lumbar region: Secondary | ICD-10-CM | POA: Diagnosis not present

## 2017-12-30 DIAGNOSIS — Z01419 Encounter for gynecological examination (general) (routine) without abnormal findings: Secondary | ICD-10-CM | POA: Diagnosis not present

## 2017-12-30 DIAGNOSIS — Z124 Encounter for screening for malignant neoplasm of cervix: Secondary | ICD-10-CM | POA: Diagnosis not present

## 2017-12-30 DIAGNOSIS — R87611 Atypical squamous cells cannot exclude high grade squamous intraepithelial lesion on cytologic smear of cervix (ASC-H): Secondary | ICD-10-CM | POA: Diagnosis not present

## 2017-12-30 DIAGNOSIS — M9904 Segmental and somatic dysfunction of sacral region: Secondary | ICD-10-CM | POA: Diagnosis not present

## 2017-12-30 LAB — HM PAP SMEAR: HM Pap smear: NORMAL

## 2017-12-30 MED FILL — DOXEPIN 75 MG CAPSULE: 75 | 30 days supply | Qty: 60 | Fill #3

## 2018-01-06 ENCOUNTER — Other Ambulatory Visit: Payer: 59

## 2018-01-07 MED FILL — ALPRAZolam 0.5 MG TABS: 0.5 | 10 days supply | Qty: 10 | Fill #0

## 2018-01-08 MED FILL — ZOLPIDEM TART ER 12.5 MG TA: 12.5 | 30 days supply | Qty: 30 | Fill #1

## 2018-01-13 ENCOUNTER — Other Ambulatory Visit (INDEPENDENT_AMBULATORY_CARE_PROVIDER_SITE_OTHER): Payer: 59

## 2018-01-13 DIAGNOSIS — R7301 Impaired fasting glucose: Secondary | ICD-10-CM | POA: Diagnosis not present

## 2018-01-13 DIAGNOSIS — E781 Pure hyperglyceridemia: Secondary | ICD-10-CM | POA: Diagnosis not present

## 2018-01-13 LAB — TSH: TSH: 3.14 u[IU]/mL (ref 0.35–4.50)

## 2018-01-13 LAB — COMPREHENSIVE METABOLIC PANEL
ALBUMIN: 4.3 g/dL (ref 3.5–5.2)
ALT: 20 U/L (ref 0–35)
AST: 14 U/L (ref 0–37)
Alkaline Phosphatase: 52 U/L (ref 39–117)
BUN: 11 mg/dL (ref 6–23)
CHLORIDE: 108 meq/L (ref 96–112)
CO2: 21 mEq/L (ref 19–32)
CREATININE: 0.64 mg/dL (ref 0.40–1.20)
Calcium: 9.3 mg/dL (ref 8.4–10.5)
GFR: 112.8 mL/min (ref >60.00)
GLUCOSE: 93 mg/dL (ref 70–99)
Potassium: 3.6 mEq/L (ref 3.5–5.1)
SODIUM: 138 meq/L (ref 135–145)
TOTAL PROTEIN: 7.4 g/dL (ref 6.0–8.3)
Total Bilirubin: 0.2 mg/dL (ref 0.2–1.2)

## 2018-01-13 LAB — LIPID PANEL
CHOL/HDL RATIO: 5
CHOLESTEROL: 194 mg/dL (ref 0–200)
HDL: 39.5 mg/dL (ref >39.00)
NonHDL: 154.9
Triglycerides: 211 mg/dL — ABNORMAL HIGH (ref 0.0–149.0)
VLDL: 42.2 mg/dL — ABNORMAL HIGH (ref 0.0–40.0)

## 2018-01-13 LAB — HEMOGLOBIN A1C: Hgb A1c MFr Bld: 5.4 % (ref 4.6–6.5)

## 2018-01-13 LAB — LDL CHOLESTEROL, DIRECT: LDL DIRECT: 121 mg/dL

## 2018-01-29 DIAGNOSIS — M545 Low back pain: Secondary | ICD-10-CM | POA: Diagnosis not present

## 2018-01-29 DIAGNOSIS — M25561 Pain in right knee: Secondary | ICD-10-CM | POA: Diagnosis not present

## 2018-01-29 DIAGNOSIS — M47816 Spondylosis without myelopathy or radiculopathy, lumbar region: Secondary | ICD-10-CM | POA: Diagnosis not present

## 2018-01-29 MED FILL — IBUPROFEN 800 MG TAB: 800 | 30 days supply | Qty: 90 | Fill #0

## 2018-01-29 MED FILL — FAMOTIDINE 20 MG TABLET: 20 | 30 days supply | Qty: 60 | Fill #0

## 2018-02-03 MED FILL — CYCLOBENZAPRINE 10 MG TAB: 10 | 10 days supply | Qty: 30 | Fill #5

## 2018-02-03 MED FILL — DEXTROAMP-AMPHETAMIN 15 MG: 15 | 30 days supply | Qty: 60 | Fill #0

## 2018-02-04 MED FILL — DOXEPIN 75 MG CAPSULE: 75 | 30 days supply | Qty: 60 | Fill #0

## 2018-02-06 ENCOUNTER — Other Ambulatory Visit (HOSPITAL_COMMUNITY): Payer: Self-pay | Admitting: Orthopedic Surgery

## 2018-02-06 DIAGNOSIS — M47816 Spondylosis without myelopathy or radiculopathy, lumbar region: Secondary | ICD-10-CM

## 2018-02-09 MED FILL — ZOLPIDEM TART ER 12.5 MG TA: 12.5 | 30 days supply | Qty: 30 | Fill #2

## 2018-02-09 MED FILL — ALPRAZolam 0.5 MG TABS: 0.5 | 10 days supply | Qty: 10 | Fill #1

## 2018-02-17 DIAGNOSIS — M47816 Spondylosis without myelopathy or radiculopathy, lumbar region: Secondary | ICD-10-CM | POA: Diagnosis not present

## 2018-03-03 DIAGNOSIS — N871 Moderate cervical dysplasia: Secondary | ICD-10-CM | POA: Diagnosis not present

## 2018-03-03 DIAGNOSIS — Z3202 Encounter for pregnancy test, result negative: Secondary | ICD-10-CM | POA: Diagnosis not present

## 2018-03-03 DIAGNOSIS — M545 Low back pain: Secondary | ICD-10-CM | POA: Diagnosis not present

## 2018-03-03 DIAGNOSIS — R87611 Atypical squamous cells cannot exclude high grade squamous intraepithelial lesion on cytologic smear of cervix (ASC-H): Secondary | ICD-10-CM | POA: Diagnosis not present

## 2018-03-03 DIAGNOSIS — M25561 Pain in right knee: Secondary | ICD-10-CM | POA: Diagnosis not present

## 2018-03-04 DIAGNOSIS — F4000 Agoraphobia, unspecified: Secondary | ICD-10-CM | POA: Diagnosis not present

## 2018-03-04 DIAGNOSIS — F919 Conduct disorder, unspecified: Secondary | ICD-10-CM | POA: Diagnosis not present

## 2018-03-04 DIAGNOSIS — F329 Major depressive disorder, single episode, unspecified: Secondary | ICD-10-CM | POA: Diagnosis not present

## 2018-03-05 ENCOUNTER — Other Ambulatory Visit: Payer: Self-pay | Admitting: Obstetrics and Gynecology

## 2018-03-05 DIAGNOSIS — Z803 Family history of malignant neoplasm of breast: Secondary | ICD-10-CM

## 2018-03-05 MED FILL — DOXEPIN 75 MG CAPSULE: 75 | 30 days supply | Qty: 60 | Fill #1

## 2018-03-05 MED FILL — DEXTROAMP-AMPHETAMIN 15 MG: 15 | 30 days supply | Qty: 60 | Fill #0

## 2018-03-10 DIAGNOSIS — M47816 Spondylosis without myelopathy or radiculopathy, lumbar region: Secondary | ICD-10-CM | POA: Diagnosis not present

## 2018-03-10 MED FILL — ZOLPIDEM TART ER 12.5 MG TA: 12.5 | 30 days supply | Qty: 30 | Fill #3

## 2018-03-10 MED FILL — ALPRAZolam 0.5 MG TABS: 0.5 | 10 days supply | Qty: 10 | Fill #2

## 2018-03-12 DIAGNOSIS — M545 Low back pain: Secondary | ICD-10-CM | POA: Diagnosis not present

## 2018-03-17 DIAGNOSIS — N871 Moderate cervical dysplasia: Secondary | ICD-10-CM | POA: Diagnosis not present

## 2018-03-17 DIAGNOSIS — M545 Low back pain: Secondary | ICD-10-CM | POA: Diagnosis not present

## 2018-03-20 DIAGNOSIS — M47816 Spondylosis without myelopathy or radiculopathy, lumbar region: Secondary | ICD-10-CM | POA: Diagnosis not present

## 2018-03-23 MED FILL — NUVARING VAGINAL RING: 0.12-0.015 | 84 days supply | Qty: 3 | Fill #0

## 2018-03-24 ENCOUNTER — Other Ambulatory Visit: Payer: Self-pay | Admitting: Internal Medicine

## 2018-03-24 DIAGNOSIS — Z760 Encounter for issue of repeat prescription: Secondary | ICD-10-CM

## 2018-03-25 NOTE — Telephone Encounter (Signed)
Refilled: 12/24/2017 Last OV: 08/26/2017 Next OV: not scheduled

## 2018-03-26 MED FILL — traMADol HCL 50 MG TABS: 50 | 30 days supply | Qty: 90 | Fill #0

## 2018-03-26 NOTE — Telephone Encounter (Signed)
REFILLED,  PEASE SCHEDULE HER 6 MONTH FOLLOW UP     NO MORE REFILLS WIHTOUT IT

## 2018-03-26 NOTE — Telephone Encounter (Signed)
LMTCB. Need to schedule pt a 6 month follow up with Dr. Derrel Nip in order to get anymore refills. Medication was refilled for a month.

## 2018-03-26 NOTE — Telephone Encounter (Signed)
Pt has been sch for 05-13-18 with dr Derrel Nip which is  next avail 30 min slot. Pt is on wait list

## 2018-04-01 DIAGNOSIS — M545 Low back pain: Secondary | ICD-10-CM | POA: Diagnosis not present

## 2018-04-06 MED FILL — DEXTROAMP-AMPHETAMIN 15 MG: 15 | 30 days supply | Qty: 60 | Fill #0

## 2018-04-13 MED FILL — ALPRAZolam 0.5 MG TABS: 0.5 | 10 days supply | Qty: 10 | Fill #3

## 2018-04-13 MED FILL — DOXEPIN 75 MG CAPSULE: 75 | 30 days supply | Qty: 60 | Fill #2

## 2018-04-13 MED FILL — ZOLPIDEM TART ER 12.5 MG TA: 12.5 | 30 days supply | Qty: 30 | Fill #0

## 2018-04-15 DIAGNOSIS — M5416 Radiculopathy, lumbar region: Secondary | ICD-10-CM | POA: Diagnosis not present

## 2018-04-17 DIAGNOSIS — M545 Low back pain: Secondary | ICD-10-CM | POA: Diagnosis not present

## 2018-04-27 ENCOUNTER — Other Ambulatory Visit: Payer: Self-pay | Admitting: Internal Medicine

## 2018-04-27 DIAGNOSIS — Z760 Encounter for issue of repeat prescription: Secondary | ICD-10-CM

## 2018-04-27 MED FILL — CYCLOBENZAPRINE 10 MG TAB: 10 | 10 days supply | Qty: 30 | Fill #0

## 2018-04-27 MED FILL — traMADol HCL 50 MG TABS: 50 | 22 days supply | Qty: 90 | Fill #0

## 2018-05-04 ENCOUNTER — Telehealth: Payer: Self-pay

## 2018-05-04 DIAGNOSIS — Z111 Encounter for screening for respiratory tuberculosis: Secondary | ICD-10-CM

## 2018-05-04 NOTE — Telephone Encounter (Signed)
Is it okay to order lab?

## 2018-05-04 NOTE — Telephone Encounter (Signed)
Thanks  I added it

## 2018-05-04 NOTE — Telephone Encounter (Signed)
Order needs to be placed before scheduling Copied from [place] [id number]. Topic: Appointment Scheduling - Scheduling Inquiry for Clinic >> May 04, 2018  9:27 AM Reyne Dumas L wrote: Reason for CRM:  Pt states she needs a Quantaferon Gold test done for school and wants to know when she can come in for that. Pt can be reached at [phone]

## 2018-05-04 NOTE — Addendum Note (Signed)
Addended by: Crecencio Mc on: 05/04/2018 09:30 PM   Modules accepted: Orders

## 2018-05-07 ENCOUNTER — Other Ambulatory Visit (INDEPENDENT_AMBULATORY_CARE_PROVIDER_SITE_OTHER): Payer: 59

## 2018-05-07 DIAGNOSIS — Z111 Encounter for screening for respiratory tuberculosis: Secondary | ICD-10-CM | POA: Diagnosis not present

## 2018-05-07 MED FILL — DEXTROAMP-AMPHETAMIN 15 MG: 15 | 30 days supply | Qty: 60 | Fill #0

## 2018-05-09 LAB — QUANTIFERON-TB GOLD PLUS
NIL: 0.15 IU/mL
QuantiFERON-TB Gold Plus: NEGATIVE
TB1-NIL: 0.17 [IU]/mL
TB2-NIL: 0.1 [IU]/mL

## 2018-05-13 ENCOUNTER — Ambulatory Visit (INDEPENDENT_AMBULATORY_CARE_PROVIDER_SITE_OTHER): Payer: 59 | Admitting: Internal Medicine

## 2018-05-13 ENCOUNTER — Encounter

## 2018-05-13 ENCOUNTER — Encounter: Payer: Self-pay | Admitting: Internal Medicine

## 2018-05-13 VITALS — BP 118/84 | HR 107 | Temp 98.3°F | Resp 16 | Ht 66.5 in | Wt 212.0 lb

## 2018-05-13 DIAGNOSIS — L989 Disorder of the skin and subcutaneous tissue, unspecified: Secondary | ICD-10-CM

## 2018-05-13 DIAGNOSIS — E781 Pure hyperglyceridemia: Secondary | ICD-10-CM

## 2018-05-13 DIAGNOSIS — F419 Anxiety disorder, unspecified: Secondary | ICD-10-CM

## 2018-05-13 DIAGNOSIS — Z6833 Body mass index (BMI) 33.0-33.9, adult: Secondary | ICD-10-CM

## 2018-05-13 DIAGNOSIS — M545 Low back pain, unspecified: Secondary | ICD-10-CM

## 2018-05-13 DIAGNOSIS — Z760 Encounter for issue of repeat prescription: Secondary | ICD-10-CM

## 2018-05-13 DIAGNOSIS — E6609 Other obesity due to excess calories: Secondary | ICD-10-CM | POA: Diagnosis not present

## 2018-05-13 DIAGNOSIS — F902 Attention-deficit hyperactivity disorder, combined type: Secondary | ICD-10-CM

## 2018-05-13 DIAGNOSIS — F5105 Insomnia due to other mental disorder: Secondary | ICD-10-CM | POA: Diagnosis not present

## 2018-05-13 MED ORDER — TRAMADOL HCL 50 MG PO TABS: 50.0000 mg | ORAL_TABLET | Freq: Four times a day (QID) | ORAL | 5 refills | Status: DC | PRN

## 2018-05-13 MED FILL — ZOLPIDEM TART ER 12.5 MG TA: 12.5 | 30 days supply | Qty: 30 | Fill #1

## 2018-05-13 MED FILL — ALPRAZolam 0.5 MG TABS: 0.5 | 10 days supply | Qty: 10 | Fill #0

## 2018-05-13 NOTE — Patient Instructions (Signed)
Tramadol has been refilled  I will be happy to assume management of your ADD and depression/anxiety once I receive notes from your previous provieder   Referral to dermatology Is in progress  We discussed use  of the medication called Saxenda  Down the road to help you lose weight.  It is similar to a a medicine that is used to treat diabetes called Victoza,  So It may lower your blood sugars .   It is injected daily in incrementally increasing doses (if tolerated,  Nausea usually resolves in a few days)"  0.6 mg daily   Week 1 1.2 mg daily Week 2 1.8 mg  [place] Week 3 2.4 mg daily Week 4 3.0 mg daily Week 5 and ongoing   If you want to bring the pen with you to be shown how to give yourself the dose,  We wil make you an  RN visit once you pick up your medication from your pharmacy   Liraglutide injection (Weight Management) What is this medicine? LIRAGLUTIDE (LIR a GLOO tide) is used with a reduced calorie diet and exercise to help you lose weight. This medicine may be used for other purposes; ask your health care provider or pharmacist if you have questions. COMMON BRAND NAME(S): Saxenda What should I tell my health care provider before I take this medicine? They need to know if you have any of these conditions: -endocrine tumors (MEN 2) or if someone in your family had these tumors -gallbladder disease -high cholesterol -history of alcohol abuse problem -history of pancreatitis -kidney disease or if you are on dialysis -liver disease -previous swelling of the tongue, face, or lips with difficulty breathing, difficulty swallowing, hoarseness, or tightening of the throat -stomach problems -suicidal thoughts, plans, or attempt; a previous suicide attempt by you or a family member -thyroid cancer or if someone in your family had thyroid cancer -an unusual or allergic reaction to liraglutide, other medicines, foods, dyes, or preservatives -pregnant or trying to get  pregnant -breast-feeding How should I use this medicine? This medicine is for injection under the skin of your upper leg, stomach area, or upper arm. You will be taught how to prepare and give this medicine. Use exactly as directed. Take your medicine at regular intervals. Do not take it more often than directed. It is important that you put your used needles and syringes in a special sharps container. Do not put them in a trash can. If you do not have a sharps container, call your pharmacist or healthcare provider to get one. A special MedGuide will be given to you by the pharmacist with each prescription and refill. Be sure to read this information carefully each time. Talk to your pediatrician regarding the use of this medicine in children. Special care may be needed. Overdosage: If you think you have taken too much of this medicine contact a poison control center or emergency room at once. NOTE: This medicine is only for you. Do not share this medicine with others. What if I miss a dose? If you miss a dose, take it as soon as you can. If it is almost time for your next dose, take only that dose. Do not take double or extra doses. If you miss your dose for 3 days or more, call your doctor or health care professional to talk about how to restart this medicine. What may interact with this medicine? -insulin and other medicines for diabetes This list may not describe all possible interactions. Give  your health care provider a list of all the medicines, herbs, non-prescription drugs, or dietary supplements you use. Also tell them if you smoke, drink alcohol, or use illegal drugs. Some items may interact with your medicine. What should I watch for while using this medicine? Visit your doctor or health care professional for regular checks on your progress. This medicine is intended to be used in addition to a healthy diet and appropriate exercise. The best results are achieved this way. Do not increase  or in any way change your dose without consulting your doctor or health care professional. Drink plenty of fluids while taking this medicine. Check with your doctor or health care professional if you get an attack of severe diarrhea, nausea, and vomiting. The loss of too much body fluid can make it dangerous for you to take this medicine. This medicine may affect blood sugar levels. If you have diabetes, check with your doctor or health care professional before you change your diet or the dose of your diabetic medicine. Patients and their families should watch out for worsening depression or thoughts of suicide. Also watch out for sudden changes in feelings such as feeling anxious, agitated, panicky, irritable, hostile, aggressive, impulsive, severely restless, overly excited and hyperactive, or not being able to sleep. If this happens, especially at the beginning of treatment or after a change in dose, call your health care professional. What side effects may I notice from receiving this medicine? Side effects that you should report to your doctor or health care professional as soon as possible: -allergic reactions like skin rash, itching or hives, swelling of the face, lips, or tongue -breathing problems -diarrhea that continues or is severe -lump or swelling on the neck -severe nausea -signs and symptoms of infection like fever or chills; cough; sore throat; pain or trouble passing urine -signs and symptoms of low blood sugar such as feeling anxious, confusion, dizziness, increased hunger, unusually weak or tired, sweating, shakiness, cold, irritable, headache, blurred vision, fast heartbeat, loss of consciousness -signs and symptoms of kidney injury like trouble passing urine or change in the amount of urine -trouble swallowing -unusual stomach upset or pain -vomiting Side effects that usually do not require medical attention (report to your doctor or health care professional if they continue or  are bothersome): -constipation -decreased appetite -diarrhea -fatigue -headache -nausea -pain, redness, or irritation at site where injected -stomach upset -stuffy or runny nose This list may not describe all possible side effects. Call your doctor for medical advice about side effects. You may report side effects to FDA at 1-800-FDA-1088. Where should I keep my medicine? Keep out of the reach of children. Store unopened pen in a refrigerator between 2 and 8 degrees C (36 and 46 degrees F). Do not freeze or use if the medicine has been frozen. Protect from light and excessive heat. After you first use the pen, it can be stored at room temperature between 15 and 30 degrees C (59 and 86 degrees F) or in a refrigerator. Throw away your used pen after 30 days or after the expiration date, whichever comes first. Do not store your pen with the needle attached. If the needle is left on, medicine may leak from the pen. NOTE: This sheet is a summary. It may not cover all possible information. If you have questions about this medicine, talk to your doctor, pharmacist, or health care provider.  2018 Elsevier/Gold Standard (2016-06-06 14:41:37)

## 2018-05-13 NOTE — Progress Notes (Signed)
Subjective:  Patient ID: Sonia Holden, female    DOB: 30-Jul-1985  Age: 32 y.o. MRN: [id number]  CC: The primary encounter diagnosis was Skin lesion of neck. Diagnoses of Medication refill, Class 1 obesity due to excess calories without serious comorbidity with body mass index (BMI) of 33.0 to 33.9 in adult, Attention deficit hyperactivity disorder (ADHD), combined type, Lumbar back pain, Insomnia secondary to anxiety, and Hypertriglyceridemia were also pertinent to this visit.  HPI Sonia Holden presents for FOLLOW UP ON CHRONIC low back pain   Pain is currently managed with tramadol taken 3 times daily and prn muscle relaxers. She has had no exacerbations but he rpai is worse after working all day.  She is requesting refill the tramadol and doxepin    Being treated for ADD , depression ,  Anxiety and insomnia.  Mood disorder and ADD have been well controlled on current regimen by NP at psychiatric clinic and she is requesting that I manage going forward. Records not available.    Obesity:  She is intentionally losing weight,  And has lost  34 lbs since March .  2 meals per day.   She has noticed 2 moles on her neck that have changed shape and enlarged  recently .   Outpatient Medications Prior to Visit  Medication Sig Dispense Refill  . ALPRAZolam (XANAX) 1 MG tablet Take 1 tablet (1 mg total) by mouth 2 (two) times daily as needed for sleep. 60 tablet 0  . amphetamine-dextroamphetamine (ADDERALL) 10 MG tablet Take 10 mg by mouth 2 (two) times daily.    . Biotin 1 MG CAPS biotin    . cyclobenzaprine (FLEXERIL) 10 MG tablet TAKE 1 TABLET BY MOUTH 3 TIMES DAILY AS NEEDED FOR MUSCLE SPASMS. 30 tablet 5  . doxepin (SINEQUAN) 150 MG capsule Take 1 capsule (150 mg total) by mouth at bedtime.    Marland Kitchen ibuprofen (ADVIL,MOTRIN) 800 MG tablet Take 1 tablet (800 mg total) by mouth every 8 (eight) hours as needed. 90 tablet 5  . Multiple Vitamin (MULTIVITAMIN) tablet Take 1 tablet by mouth  daily.    Marland Kitchen NUVARING 0.12-0.015 MG/24HR vaginal ring   4  . valACYclovir (VALTREX) 500 MG tablet Take 1 tablet (500 mg total) by mouth 2 (two) times daily. 14 tablet 3  . Zinc 10 MG LOZG zinc    . zolpidem (AMBIEN CR) 12.5 MG CR tablet Take 12.5 mg by mouth at bedtime as needed for sleep.    . traMADol (ULTRAM) 50 MG tablet TAKE 1 TABLET BY MOUTH EVERY 6 HOURS AS NEEDED FOR PAIN 90 tablet 0  . cyclobenzaprine (FLEXERIL) 10 MG tablet Take 1 tablet (10 mg total) by mouth 3 (three) times daily. (Patient not taking: Reported on 05/13/2018) 30 tablet 0  . methocarbamol (ROBAXIN) 500 MG tablet Take 1 tablet (500 mg total) by mouth every 6 (six) hours as needed for muscle spasms. (Patient not taking: Reported on 05/13/2018) 40 tablet 0  . ondansetron (ZOFRAN ODT) 4 MG disintegrating tablet Take 1 tablet (4 mg total) by mouth every 8 (eight) hours as needed for nausea or vomiting. (Patient not taking: Reported on 05/13/2018) 20 tablet 0  . oxyCODONE (ROXICODONE) 5 MG immediate release tablet Take 1-2 tablets (5-10 mg total) by mouth every 4 (four) hours as needed for moderate pain or severe pain. (Patient not taking: Reported on 05/13/2018) 40 tablet 0   No facility-administered medications prior to visit.     Review of Systems;  Patient  denies headache, fevers, malaise, unintentional weight loss, skin rash, eye pain, sinus congestion and sinus pain, sore throat, dysphagia,  hemoptysis , cough, dyspnea, wheezing, chest pain, palpitations, orthopnea, edema, abdominal pain, nausea, melena, diarrhea, constipation, flank pain, dysuria, hematuria, urinary  Frequency, nocturia, numbness, tingling, seizures,  Focal weakness, Loss of consciousness,  Tremor, insomnia, depression, anxiety, and suicidal ideation.      Objective:  BP 118/84 (BP Location: Left Arm, Patient Position: Sitting, Cuff Size: Large)   Pulse (!) 107   Temp 98.3 F (36.8 C) (Oral)   Resp 16   Ht 5' 6.5" (1.689 m)   Wt 212 lb (96.2 kg)    SpO2 98%   BMI 33.71 kg/m   BP Readings from Last 3 Encounters:  05/13/18 118/84  08/26/17 100/66  08/05/17 127/78    Wt Readings from Last 3 Encounters:  05/13/18 212 lb (96.2 kg)  08/26/17 238 lb 9.6 oz (108.2 kg)  08/05/17 256 lb 3.2 oz (116.2 kg)    General appearance: alert, cooperative and appears stated age Ears: normal TM's and external ear canals both ears Throat: lips, mucosa, and tongue normal; teeth and gums normal Neck: no adenopathy, no carotid bruit, supple, symmetrical, trachea midline and thyroid not enlarged, symmetric, no tenderness/mass/nodules Back: symmetric, no curvature. ROM normal. No CVA tenderness. Lungs: clear to auscultation bilaterally Heart: regular rate and rhythm, S1, S2 normal, no murmur, click, rub or gallop Abdomen: soft, non-tender; bowel sounds normal; no masses,  no organomegaly Pulses: 2+ and symmetric Skin: Skin color, texture, turgor normal. No rashes or lesions Lymph nodes: Cervical, supraclavicular, and axillary nodes normal.  Lab Results  Component Value Date   HGBA1C 5.4 01/13/2018   HGBA1C 5.5 11/13/2016   HGBA1C 5.4 06/30/2014    Lab Results  Component Value Date   CREATININE 0.64 01/13/2018   CREATININE 0.82 11/13/2016   CREATININE 0.56 08/30/2015    Lab Results  Component Value Date   WBC 15.3 (H) 02/06/2009   HGB (H) 02/06/2009    16.7 QA FLAGS MODIFIED BY DEMOGRAPHIC UPDATE ON 09/06 AT 0344   HCT (H) 02/06/2009    49.0 QA FLAGS MODIFIED BY DEMOGRAPHIC UPDATE ON 09/06 AT 0344   PLT 269 02/06/2009   GLUCOSE 93 01/13/2018   CHOL 194 01/13/2018   TRIG 211.0 (H) 01/13/2018   HDL 39.50 01/13/2018   LDLDIRECT 121.0 01/13/2018   LDLCALC 102 (H) 06/30/2014   ALT 20 01/13/2018   AST 14 01/13/2018   NA 138 01/13/2018   K 3.6 01/13/2018   CL 108 01/13/2018   CREATININE 0.64 01/13/2018   BUN 11 01/13/2018   CO2 21 01/13/2018   TSH 3.14 01/13/2018   INR 0.9 02/06/2009   HGBA1C 5.4 01/13/2018    No results  found.  Assessment & Plan:   Problem List Items Addressed This Visit    ADD (attention deficit disorder)    Managed by  Her psychiatrist with Adderall       Hypertriglyceridemia    Improved with weight loss and careful diet.    Lab Results  Component Value Date   CHOL 194 01/13/2018   HDL 39.50 01/13/2018   LDLCALC 102 (H) 06/30/2014   LDLDIRECT 121.0 01/13/2018   TRIG 211.0 (H) 01/13/2018   CHOLHDL 5 01/13/2018         Insomnia secondary to anxiety    Managed with ambien and doxepin,  No changes today      Lumbar back pain    Managed with massage  therapy and tramadol  .  She has not had any ER visits  And has not requested any early refills.  Her Refill history was confirmed via [place] Controlled Substance database by me today during her visit and there have been no prescriptions of controlled substances filled from any providers other than me. .       Relevant Medications   traMADol (ULTRAM) 50 MG tablet   Obesity    I have congratulated her in reduction of   BMI and encouraged  Continued weight loss with goal of 10% of body weight over the next 6 months using a low glycemic index diet and regular exercise a minimum of 5 days per week.  Discussed future use of Saxenda if her weight plateaus before she reaches goal. BMI of < 30.         Other Visit Diagnoses    Skin lesion of neck    -  Primary   Relevant Orders   Ambulatory referral to Dermatology   Medication refill       Relevant Medications   traMADol (ULTRAM) 50 MG tablet    A total of 25 minutes of face to face time was spent with patient more than half of which was spent in counselling about the above mentioned conditions  and coordination of care   I have discontinued Marijean Bravo. Ferrucci's methocarbamol, ondansetron, and oxyCODONE. I have also changed her traMADol. Additionally, I am having her maintain her amphetamine-dextroamphetamine, multivitamin, ALPRAZolam, Biotin, NUVARING, Zinc, ibuprofen, doxepin,  valACYclovir, zolpidem, and cyclobenzaprine.  Meds ordered this encounter  Medications  . traMADol (ULTRAM) 50 MG tablet    Sig: Take 1 tablet (50 mg total) by mouth every 6 (six) hours as needed. for pain    Dispense:  90 tablet    Refill:  5    Medications Discontinued During This Encounter  Medication Reason  . cyclobenzaprine (FLEXERIL) 10 MG tablet Patient has not taken in last 30 days  . methocarbamol (ROBAXIN) 500 MG tablet Patient has not taken in last 30 days  . ondansetron (ZOFRAN ODT) 4 MG disintegrating tablet Patient has not taken in last 30 days  . oxyCODONE (ROXICODONE) 5 MG immediate release tablet Patient has not taken in last 30 days  . traMADol (ULTRAM) 50 MG tablet Reorder    Follow-up: No follow-ups on file.   Crecencio Mc, MD

## 2018-05-16 NOTE — Assessment & Plan Note (Signed)
Improved with weight loss and careful diet.    Lab Results  Component Value Date   CHOL 194 01/13/2018   HDL 39.50 01/13/2018   LDLCALC 102 (H) 06/30/2014   LDLDIRECT 121.0 01/13/2018   TRIG 211.0 (H) 01/13/2018   CHOLHDL 5 01/13/2018

## 2018-05-16 NOTE — Assessment & Plan Note (Signed)
Managed by  Her psychiatrist with Adderall

## 2018-05-16 NOTE — Assessment & Plan Note (Signed)
Managed with massage therapy and tramadol  .  She has not had any ER visits  And has not requested any early refills.  Her Refill history was confirmed via [place] Controlled Substance database by me today during her visit and there have been no prescriptions of controlled substances filled from any providers other than me. Sonia Holden

## 2018-05-16 NOTE — Assessment & Plan Note (Addendum)
I have congratulated her in reduction of   BMI and encouraged  Continued weight loss with goal of 10% of body weight over the next 6 months using a low glycemic index diet and regular exercise a minimum of 5 days per week.  Discussed future use of Saxenda if her weight plateaus before she reaches goal. BMI of < 30.

## 2018-05-16 NOTE — Assessment & Plan Note (Signed)
Managed with ambien and doxepin,  No changes today

## 2018-05-18 ENCOUNTER — Encounter: Payer: Self-pay | Admitting: *Deleted

## 2018-05-20 DIAGNOSIS — Z113 Encounter for screening for infections with a predominantly sexual mode of transmission: Secondary | ICD-10-CM | POA: Diagnosis not present

## 2018-05-20 DIAGNOSIS — D061 Carcinoma in situ of exocervix: Secondary | ICD-10-CM | POA: Diagnosis not present

## 2018-05-20 DIAGNOSIS — Z3202 Encounter for pregnancy test, result negative: Secondary | ICD-10-CM | POA: Diagnosis not present

## 2018-05-20 DIAGNOSIS — N871 Moderate cervical dysplasia: Secondary | ICD-10-CM | POA: Diagnosis not present

## 2018-05-29 ENCOUNTER — Other Ambulatory Visit: Payer: Self-pay | Admitting: Internal Medicine

## 2018-05-29 MED FILL — DOXEPIN 75 MG CAPSULE: 75 | 30 days supply | Qty: 60 | Fill #3

## 2018-05-29 MED FILL — traMADol HCL 50 MG TABS: 50 | 22 days supply | Qty: 90 | Fill #0

## 2018-05-29 MED FILL — CYCLOBENZAPRINE 10 MG TAB: 10 | 10 days supply | Qty: 30 | Fill #1

## 2018-05-29 MED FILL — VALACYCLOVIR HCL 500 MG TAB: 500 | 7 days supply | Qty: 14 | Fill #0

## 2018-06-11 MED FILL — DEXTROAMP-AMPHETAMIN 15 MG: 15 | 30 days supply | Qty: 60 | Fill #0

## 2018-06-15 MED FILL — ZOLPIDEM TART ER 12.5 MG TA: 12.5 | 30 days supply | Qty: 30 | Fill #2

## 2018-06-15 MED FILL — NUVARING VAGINAL RING: 0.12-0.015 | 84 days supply | Qty: 3 | Fill #1

## 2018-06-23 DIAGNOSIS — R590 Localized enlarged lymph nodes: Secondary | ICD-10-CM | POA: Diagnosis not present

## 2018-06-24 ENCOUNTER — Other Ambulatory Visit: Payer: Self-pay | Admitting: Obstetrics and Gynecology

## 2018-06-24 DIAGNOSIS — Z803 Family history of malignant neoplasm of breast: Secondary | ICD-10-CM

## 2018-06-25 ENCOUNTER — Other Ambulatory Visit: Payer: Self-pay | Admitting: Obstetrics and Gynecology

## 2018-06-25 DIAGNOSIS — R590 Localized enlarged lymph nodes: Secondary | ICD-10-CM

## 2018-06-25 MED FILL — ALPRAZolam 0.5 MG TABS: 0.5 | 10 days supply | Qty: 10 | Fill #1

## 2018-07-01 ENCOUNTER — Ambulatory Visit
Admission: RE | Admit: 2018-07-01 | Discharge: 2018-07-01 | Disposition: A | Discharge status: Home / Self Care | Payer: 59 | Source: Ambulatory Visit | Attending: Obstetrics and Gynecology | Admitting: Obstetrics and Gynecology

## 2018-07-01 DIAGNOSIS — L700 Acne vulgaris: Secondary | ICD-10-CM | POA: Diagnosis not present

## 2018-07-01 DIAGNOSIS — R590 Localized enlarged lymph nodes: Secondary | ICD-10-CM

## 2018-07-01 DIAGNOSIS — Z1283 Encounter for screening for malignant neoplasm of skin: Secondary | ICD-10-CM | POA: Diagnosis not present

## 2018-07-01 DIAGNOSIS — N6489 Other specified disorders of breast: Secondary | ICD-10-CM | POA: Diagnosis not present

## 2018-07-01 DIAGNOSIS — Z808 Family history of malignant neoplasm of other organs or systems: Secondary | ICD-10-CM | POA: Diagnosis not present

## 2018-07-01 DIAGNOSIS — F4000 Agoraphobia, unspecified: Secondary | ICD-10-CM | POA: Diagnosis not present

## 2018-07-01 DIAGNOSIS — Z803 Family history of malignant neoplasm of breast: Secondary | ICD-10-CM | POA: Diagnosis not present

## 2018-07-01 DIAGNOSIS — L905 Scar conditions and fibrosis of skin: Secondary | ICD-10-CM | POA: Diagnosis not present

## 2018-07-01 DIAGNOSIS — R928 Other abnormal and inconclusive findings on diagnostic imaging of breast: Secondary | ICD-10-CM | POA: Diagnosis not present

## 2018-07-01 DIAGNOSIS — B009 Herpesviral infection, unspecified: Secondary | ICD-10-CM | POA: Diagnosis not present

## 2018-07-01 DIAGNOSIS — F919 Conduct disorder, unspecified: Secondary | ICD-10-CM | POA: Diagnosis not present

## 2018-07-01 DIAGNOSIS — F329 Major depressive disorder, single episode, unspecified: Secondary | ICD-10-CM | POA: Diagnosis not present

## 2018-07-01 DIAGNOSIS — D229 Melanocytic nevi, unspecified: Secondary | ICD-10-CM | POA: Diagnosis not present

## 2018-07-01 MED FILL — DOXEPIN 75 MG CAPSULE: 75 | 30 days supply | Qty: 60 | Fill #0

## 2018-07-01 MED FILL — CLINDAMYCIN PHOS-BENZOYL PE: 1-5 | 60 days supply | Qty: 50 | Fill #0

## 2018-07-01 MED FILL — VYVANSE 30 MG CAPSULE: 30 | 30 days supply | Qty: 30 | Fill #0

## 2018-07-01 MED FILL — valACYclovir HCL 1 GM TABS: 1 | 15 days supply | Qty: 30 | Fill #0

## 2018-07-09 MED FILL — TRETINOIN 0.025% CREAM: 0.025 | 90 days supply | Qty: 45 | Fill #0

## 2018-07-13 MED FILL — traMADol HCL 50 MG TABS: 50 | 22 days supply | Qty: 90 | Fill #1

## 2018-07-13 MED FILL — CYCLOBENZAPRINE 10 MG TAB: 10 | 10 days supply | Qty: 30 | Fill #2

## 2018-07-15 MED FILL — ZOLPIDEM TART ER 12.5 MG TA: 12.5 | 30 days supply | Qty: 30 | Fill #0

## 2018-07-23 MED FILL — VYVANSE 50 MG CAPSULE: 50 | 30 days supply | Qty: 30 | Fill #0

## 2018-08-04 MED FILL — DOXEPIN 75 MG CAPSULE: 75 | 30 days supply | Qty: 60 | Fill #1 | Status: TO

## 2018-08-13 MED FILL — ZOLPIDEM TART ER 12.5 MG TA: 12.5 | 30 days supply | Qty: 30 | Fill #1 | Status: TO

## 2018-08-13 MED FILL — traMADol HCL 50 MG TABS: 50 | 22 days supply | Qty: 90 | Fill #2 | Status: TO

## 2018-08-13 MED FILL — CYCLOBENZAPRINE 10 MG TAB: 10 | 10 days supply | Qty: 30 | Fill #3 | Status: TO

## 2018-08-14 MED FILL — ALPRAZolam 0.5 MG TABS: 0.5 | 10 days supply | Qty: 10 | Fill #2

## 2018-08-14 MED FILL — VYVANSE 70 MG CAPSULE: 70 | 30 days supply | Qty: 30 | Fill #0

## 2018-09-01 MED FILL — traMADol HCL 50 MG TABS: 50 | 22 days supply | Qty: 90 | Fill #0

## 2018-09-01 MED FILL — DOXEPIN 75 MG CAPSULE: 75 | 30 days supply | Qty: 60 | Fill #0

## 2018-09-01 MED FILL — IBUPROFEN 800 MG TAB: 800 | 30 days supply | Qty: 90 | Fill #0

## 2018-09-01 MED FILL — CYCLOBENZAPRINE HCL 10 MG T: 10 | 10 days supply | Qty: 30 | Fill #0

## 2018-09-01 MED FILL — FAMOTIDINE 20 MG TABLET: 20 | 30 days supply | Qty: 60 | Fill #0

## 2018-09-09 MED FILL — AMPHETAMINE-DEXTROAMPHETAMI: 20 | 30 days supply | Qty: 30 | Fill #0

## 2018-09-10 MED FILL — VYVANSE 70 MG CAPSULE: 70 | 30 days supply | Qty: 30 | Fill #0

## 2018-09-15 DIAGNOSIS — M25561 Pain in right knee: Secondary | ICD-10-CM | POA: Diagnosis not present

## 2018-09-16 DIAGNOSIS — M25561 Pain in right knee: Secondary | ICD-10-CM | POA: Diagnosis not present

## 2018-09-16 DIAGNOSIS — Z9889 Other specified postprocedural states: Secondary | ICD-10-CM | POA: Diagnosis not present

## 2018-09-16 MED FILL — GABAPENTIN 600 MG TABLET: 600 | 45 days supply | Qty: 90 | Fill #0

## 2018-09-17 MED FILL — ZOLPIDEM TART ER 12.5 MG TA: 12.5 | 30 days supply | Qty: 30 | Fill #0

## 2018-10-09 MED FILL — CYCLOBENZAPRINE HCL 10 MG T: 10 | 10 days supply | Qty: 30 | Fill #1

## 2018-10-09 MED FILL — DOXEPIN 75 MG CAPSULE: 75 | 30 days supply | Qty: 60 | Fill #1

## 2018-10-09 MED FILL — traMADol HCL 50 MG TABS: 50 | 22 days supply | Qty: 90 | Fill #1

## 2018-10-14 MED FILL — ALPRAZolam 0.5 MG TABS: 0.5 | 10 days supply | Qty: 10 | Fill #0

## 2018-10-15 MED FILL — VYVANSE 70 MG CAPSULE: 70 | 30 days supply | Qty: 30 | Fill #0

## 2018-10-15 MED FILL — AMPHETAMINE-DEXTROAMPHETAMI: 20 | 30 days supply | Qty: 30 | Fill #0

## 2018-10-21 DIAGNOSIS — F4000 Agoraphobia, unspecified: Secondary | ICD-10-CM | POA: Diagnosis not present

## 2018-10-21 DIAGNOSIS — F329 Major depressive disorder, single episode, unspecified: Secondary | ICD-10-CM | POA: Diagnosis not present

## 2018-10-21 DIAGNOSIS — F919 Conduct disorder, unspecified: Secondary | ICD-10-CM | POA: Diagnosis not present

## 2018-10-22 MED FILL — ALPRAZolam 0.5 MG TABS: 0.5 | 10 days supply | Qty: 10 | Fill #1

## 2018-10-22 MED FILL — GABAPENTIN 600 MG TABLET: 600 | 45 days supply | Qty: 90 | Fill #0

## 2018-10-28 MED FILL — ZOLPIDEM TART ER 12.5 MG TA: 12.5 | 30 days supply | Qty: 30 | Fill #0

## 2018-11-02 MED FILL — DOXEPIN 75 MG CAPSULE: 75 | 30 days supply | Qty: 60 | Fill #0

## 2018-11-10 DIAGNOSIS — M1711 Unilateral primary osteoarthritis, right knee: Secondary | ICD-10-CM | POA: Diagnosis not present

## 2018-11-10 DIAGNOSIS — M7651 Patellar tendinitis, right knee: Secondary | ICD-10-CM | POA: Diagnosis not present

## 2018-11-11 DIAGNOSIS — L700 Acne vulgaris: Secondary | ICD-10-CM | POA: Diagnosis not present

## 2018-11-11 DIAGNOSIS — D229 Melanocytic nevi, unspecified: Secondary | ICD-10-CM | POA: Diagnosis not present

## 2018-11-11 DIAGNOSIS — D2221 Melanocytic nevi of right ear and external auricular canal: Secondary | ICD-10-CM | POA: Diagnosis not present

## 2018-11-11 DIAGNOSIS — Z86018 Personal history of other benign neoplasm: Secondary | ICD-10-CM

## 2018-11-11 DIAGNOSIS — D485 Neoplasm of uncertain behavior of skin: Secondary | ICD-10-CM | POA: Diagnosis not present

## 2018-11-11 DIAGNOSIS — I788 Other diseases of capillaries: Secondary | ICD-10-CM | POA: Diagnosis not present

## 2018-11-11 HISTORY — DX: Personal history of other benign neoplasm: Z86.018

## 2018-11-12 ENCOUNTER — Other Ambulatory Visit: Payer: Self-pay | Admitting: Internal Medicine

## 2018-11-12 DIAGNOSIS — Z760 Encounter for issue of repeat prescription: Secondary | ICD-10-CM

## 2018-11-12 MED FILL — VYVANSE 70 MG CAPSULE: 70 | 30 days supply | Qty: 30 | Fill #0

## 2018-11-12 MED FILL — AMPHETAMINE-DEXTROAMPHETAMI: 20 | 30 days supply | Qty: 30 | Fill #0

## 2018-11-13 MED FILL — CYCLOBENZAPRINE HCL 10 MG T: 10 | 10 days supply | Qty: 30 | Fill #0

## 2018-11-13 NOTE — Telephone Encounter (Signed)
Refilled: 05/13/2018 Last OV: 05/13/2018 Next OV: not scheduled

## 2018-11-14 MED FILL — traMADol HCL 50 MG TABS: 50 | 30 days supply | Qty: 90 | Fill #0

## 2018-11-17 ENCOUNTER — Telehealth: Payer: Self-pay | Admitting: Family Medicine

## 2018-11-17 ENCOUNTER — Other Ambulatory Visit: Payer: Self-pay

## 2018-11-17 ENCOUNTER — Ambulatory Visit (INDEPENDENT_AMBULATORY_CARE_PROVIDER_SITE_OTHER): Payer: 59 | Admitting: Family Medicine

## 2018-11-17 DIAGNOSIS — W57XXXA Bitten or stung by nonvenomous insect and other nonvenomous arthropods, initial encounter: Secondary | ICD-10-CM

## 2018-11-17 DIAGNOSIS — S30861A Insect bite (nonvenomous) of abdominal wall, initial encounter: Secondary | ICD-10-CM | POA: Diagnosis not present

## 2018-11-17 MED ORDER — DOXYCYCLINE HYCLATE 100 MG PO TABS: 100.0000 mg | ORAL_TABLET | Freq: Two times a day (BID) | ORAL | 0 refills | Status: DC

## 2018-11-17 MED FILL — DOXYCYCLINE HYCLATE 100 MG: 100 | 10 days supply | Qty: 20 | Fill #0

## 2018-11-17 MED FILL — DUKES W/LIDOCAINE (ZANARD): 16 days supply | Qty: 240 | Fill #0
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
## 2018-11-17 NOTE — Telephone Encounter (Signed)
Please call to set up lab appt 3-4 weeks from now.  Future lab orders are in

## 2018-11-18 NOTE — Telephone Encounter (Signed)
Called Pt No answer left VM will try back later

## 2018-11-18 NOTE — Telephone Encounter (Signed)
Called Pt and scheduled her labs for 12/09/18 @ 2:00pm

## 2018-11-24 MED FILL — TRETINOIN GEL MICRO 0.1% TU: 0.1 | 30 days supply | Qty: 45 | Fill #0

## 2018-11-26 MED FILL — CLINDAMYCIN PHOS-BENZOYL PE: 1-5 | 30 days supply | Qty: 50 | Fill #0

## 2018-11-27 MED FILL — GABAPENTIN 600 MG TABLET: 600 | 30 days supply | Qty: 90 | Fill #0

## 2018-12-03 ENCOUNTER — Telehealth: Payer: Self-pay | Admitting: Internal Medicine

## 2018-12-03 NOTE — Telephone Encounter (Signed)
Patient called in for a rash. Was advised to go to Urgent care. Because the office is closed tomorrow and Saturday.

## 2018-12-07 NOTE — Telephone Encounter (Signed)
Called to check on patient.  No answer.  LMTCB.

## 2018-12-08 NOTE — Telephone Encounter (Signed)
LMTCB

## 2018-12-09 ENCOUNTER — Other Ambulatory Visit: Payer: Self-pay

## 2018-12-09 ENCOUNTER — Other Ambulatory Visit (INDEPENDENT_AMBULATORY_CARE_PROVIDER_SITE_OTHER): Payer: 59

## 2018-12-09 DIAGNOSIS — S30861A Insect bite (nonvenomous) of abdominal wall, initial encounter: Secondary | ICD-10-CM

## 2018-12-09 DIAGNOSIS — W57XXXA Bitten or stung by nonvenomous insect and other nonvenomous arthropods, initial encounter: Secondary | ICD-10-CM | POA: Diagnosis not present

## 2018-12-09 NOTE — Addendum Note (Signed)
Addended by: Leeanne Rio on: 12/09/2018 02:59 PM   Modules accepted: Orders

## 2018-12-10 MED FILL — AMPHETAMINE-DEXTROAMPHETAMI: 20 | 30 days supply | Qty: 30 | Fill #0

## 2018-12-10 MED FILL — DUKES W/LIDOCAINE (ZANARD): 16 days supply | Qty: 240 | Fill #1

## 2018-12-10 MED FILL — ALPRAZolam 0.5 MG TABS: 0.5 | 10 days supply | Qty: 10 | Fill #2

## 2018-12-10 MED FILL — CYCLOBENZAPRINE HCL 10 MG T: 10 | 10 days supply | Qty: 30 | Fill #1

## 2018-12-10 MED FILL — VYVANSE 70 MG CAPSULE: 70 | 30 days supply | Qty: 30 | Fill #0

## 2018-12-10 MED FILL — ZOLPIDEM TART ER 12.5 MG TA: 12.5 | 30 days supply | Qty: 30 | Fill #1

## 2018-12-11 LAB — LYME AB/WESTERN BLOT REFLEX
LYME DISEASE AB, QUANT, IGM: <0.8 index (ref 0.00–0.79)
Lyme IgG/IgM Ab: <0.91 {ISR} (ref 0.00–0.90)

## 2018-12-16 ENCOUNTER — Other Ambulatory Visit: Payer: Self-pay | Admitting: Internal Medicine

## 2018-12-16 DIAGNOSIS — Z760 Encounter for issue of repeat prescription: Secondary | ICD-10-CM

## 2018-12-16 LAB — ROCKY MTN SPOTTED FVR ABS PNL(IGG+IGM)
RMSF IgG: NOT DETECTED
RMSF IgM: NOT DETECTED

## 2018-12-16 LAB — ALPHA-GAL PANEL
Beef IgE: <0.1 kU/L (ref <0.35)
Class: 0
Class: 0
Class: 0
Galactose-alpha-1,3-galactose IgE: <0.1 kU/L (ref <0.10)
LAMB/MUTTON IGE: <0.1 kU/L (ref <0.35)
Pork IgE: <0.1 kU/L (ref <0.35)

## 2018-12-16 MED FILL — traMADol HCL 50 MG TABS: 50 | 30 days supply | Qty: 90 | Fill #0

## 2018-12-16 NOTE — Telephone Encounter (Signed)
Refilled: 11/14/2018 Last OV: 11/17/2018 Next OV: not scheduled

## 2018-12-25 MED FILL — traMADol HCL 50 MG TABS: 50 | 30 days supply | Qty: 90 | Fill #0

## 2018-12-29 MED FILL — GABAPENTIN 600 MG TABLET: 600 | 30 days supply | Qty: 90 | Fill #0

## 2019-01-11 MED FILL — AMPHETAMINE-DEXTROAMPHETAMI: 20 | 30 days supply | Qty: 30 | Fill #0

## 2019-01-11 MED FILL — ZOLPIDEM TART ER 12.5 MG TA: 12.5 | 30 days supply | Qty: 30 | Fill #2

## 2019-01-11 MED FILL — VYVANSE 70 MG CAPSULE: 70 | 30 days supply | Qty: 30 | Fill #0

## 2019-01-12 MED FILL — ALPRAZolam 0.5 MG TABS: 0.5 | 10 days supply | Qty: 10 | Fill #0

## 2019-01-26 DIAGNOSIS — H5213 Myopia, bilateral: Secondary | ICD-10-CM | POA: Diagnosis not present

## 2019-01-26 DIAGNOSIS — M7651 Patellar tendinitis, right knee: Secondary | ICD-10-CM | POA: Diagnosis not present

## 2019-01-26 DIAGNOSIS — M1711 Unilateral primary osteoarthritis, right knee: Secondary | ICD-10-CM | POA: Diagnosis not present

## 2019-01-26 DIAGNOSIS — H52223 Regular astigmatism, bilateral: Secondary | ICD-10-CM | POA: Diagnosis not present

## 2019-01-27 MED FILL — traMADol HCL 50 MG TABS: 50 | 30 days supply | Qty: 90 | Fill #1

## 2019-01-27 MED FILL — GABAPENTIN 600 MG TABLET: 600 | 30 days supply | Qty: 90 | Fill #0

## 2019-02-10 DIAGNOSIS — F4000 Agoraphobia, unspecified: Secondary | ICD-10-CM | POA: Diagnosis not present

## 2019-02-10 DIAGNOSIS — F329 Major depressive disorder, single episode, unspecified: Secondary | ICD-10-CM | POA: Diagnosis not present

## 2019-02-10 DIAGNOSIS — F919 Conduct disorder, unspecified: Secondary | ICD-10-CM | POA: Diagnosis not present

## 2019-02-11 DIAGNOSIS — M1711 Unilateral primary osteoarthritis, right knee: Secondary | ICD-10-CM | POA: Diagnosis not present

## 2019-02-11 MED FILL — ALPRAZolam 0.5 MG TABS: 0.5 | 10 days supply | Qty: 10 | Fill #1

## 2019-02-11 MED FILL — DOXEPIN 75 MG CAPSULE: 75 | 30 days supply | Qty: 60 | Fill #1

## 2019-02-11 MED FILL — AMPHETAMINE-DEXTROAMPHETAMI: 20 | 30 days supply | Qty: 30 | Fill #0

## 2019-02-11 MED FILL — VYVANSE 70 MG CAPSULE: 70 | 30 days supply | Qty: 30 | Fill #0

## 2019-02-16 MED FILL — TRETINOIN GEL MICRO 0.1% TU: 0.1 | 30 days supply | Qty: 45 | Fill #1

## 2019-02-16 MED FILL — ZOLPIDEM TART ER 12.5 MG TA: 12.5 | 30 days supply | Qty: 30 | Fill #0

## 2019-02-18 DIAGNOSIS — M1711 Unilateral primary osteoarthritis, right knee: Secondary | ICD-10-CM | POA: Diagnosis not present

## 2019-02-19 MED FILL — NORLYDA 0.35 MG TABS: 0.35 | 14 days supply | Qty: 28 | Fill #0

## 2019-02-25 DIAGNOSIS — M1711 Unilateral primary osteoarthritis, right knee: Secondary | ICD-10-CM | POA: Diagnosis not present

## 2019-02-25 MED FILL — NYSTATIN 100,000 UNITS/ML S: 100000 | 7 days supply | Qty: 140 | Fill #0

## 2019-03-01 MED FILL — traMADol HCL 50 MG TABS: 50 | 30 days supply | Qty: 90 | Fill #2

## 2019-03-01 MED FILL — GABAPENTIN 600 MG TABLET: 600 | 30 days supply | Qty: 90 | Fill #1

## 2019-03-02 ENCOUNTER — Telehealth: Payer: Self-pay

## 2019-03-02 DIAGNOSIS — N921 Excessive and frequent menstruation with irregular cycle: Secondary | ICD-10-CM

## 2019-03-02 NOTE — Telephone Encounter (Signed)
Copied from [place] [id number]. Topic: Quick Communication - See Telephone Encounter >> Mar 02, 2019  8:50 AM Loma [place] wrote: CRM for notification. See Telephone encounter for: 03/02/19. Pt needs to have CB for a work in. Has been bleeding very long cycles last was 11 days and had to get med from ob gyn to stop. Feeling very tired. Call back for an appt at [phone] Lines down

## 2019-03-02 NOTE — Telephone Encounter (Signed)
Labs have been ordered to investigate the fatigue.  Schedule labs  Then a virtual visit or a face to face if she can wait until next wee;

## 2019-03-02 NOTE — Telephone Encounter (Signed)
Patient said that she has stopped bleeding after taking Rx Norlyda that was called in by OB-GYN. Patient said bleeding stopped last Tuesday.  Patient said that she has been feeling fatigue and bruising easily.  Patient denies having any pain.  Patient said that she has a routine pap scheduled with her OB-GYN in November.  Patient informed that only virtual visits are available.  I will check with Dr. Derrel Nip to see if she wants to work patient in for an in-person appt or if she would like for patient to be put on another provider's schedule.    Patient informed that she will be called back regarding appt.

## 2019-03-04 NOTE — Telephone Encounter (Signed)
Patient was scheduled a lab appt on 03/10/19 and a virtual visit on 03/17/19.

## 2019-03-10 ENCOUNTER — Other Ambulatory Visit (INDEPENDENT_AMBULATORY_CARE_PROVIDER_SITE_OTHER): Payer: 59

## 2019-03-10 ENCOUNTER — Other Ambulatory Visit: Payer: Self-pay

## 2019-03-10 DIAGNOSIS — N921 Excessive and frequent menstruation with irregular cycle: Secondary | ICD-10-CM

## 2019-03-11 LAB — CBC WITH DIFFERENTIAL/PLATELET
Basophils Absolute: 0.1 10*3/uL (ref 0.0–0.1)
Basophils Relative: 0.9 % (ref 0.0–3.0)
Eosinophils Absolute: 0.2 10*3/uL (ref 0.0–0.7)
Eosinophils Relative: 1.5 % (ref 0.0–5.0)
HCT: 44.3 % (ref 36.0–46.0)
Hemoglobin: 14.5 g/dL (ref 12.0–15.0)
Lymphocytes Relative: 38.4 % (ref 12.0–46.0)
Lymphs Abs: 4.8 10*3/uL — ABNORMAL HIGH (ref 0.7–4.0)
MCHC: 32.7 g/dL (ref 30.0–36.0)
MCV: 90.8 fl (ref 78.0–100.0)
Monocytes Absolute: 1.1 10*3/uL — ABNORMAL HIGH (ref 0.1–1.0)
Monocytes Relative: 8.9 % (ref 3.0–12.0)
Neutro Abs: 6.2 10*3/uL (ref 1.4–7.7)
Neutrophils Relative %: 50.3 % (ref 43.0–77.0)
Platelets: 338 10*3/uL (ref 150.0–400.0)
RBC: 4.87 Mil/uL (ref 3.87–5.11)
RDW: 14.3 % (ref 11.5–15.5)
WBC: 12.4 10*3/uL — ABNORMAL HIGH (ref 4.0–10.5)

## 2019-03-11 LAB — COMPREHENSIVE METABOLIC PANEL
ALT: 17 U/L (ref 0–35)
AST: 13 U/L (ref 0–37)
Albumin: 4.5 g/dL (ref 3.5–5.2)
Alkaline Phosphatase: 65 U/L (ref 39–117)
BUN: 16 mg/dL (ref 6–23)
CO2: 26 mEq/L (ref 19–32)
Calcium: 9.9 mg/dL (ref 8.4–10.5)
Chloride: 106 mEq/L (ref 96–112)
Creatinine, Ser: 0.66 mg/dL (ref 0.40–1.20)
GFR: 101.73 mL/min (ref >60.00)
Glucose, Bld: 74 mg/dL (ref 70–99)
Potassium: 4.1 mEq/L (ref 3.5–5.1)
Sodium: 142 mEq/L (ref 135–145)
Total Bilirubin: 0.2 mg/dL (ref 0.2–1.2)
Total Protein: 6.9 g/dL (ref 6.0–8.3)

## 2019-03-11 LAB — PROTIME-INR
INR: 1 ratio (ref 0.8–1.0)
Prothrombin Time: 11.2 s (ref 9.6–13.1)

## 2019-03-11 LAB — TSH: TSH: 3.02 u[IU]/mL (ref 0.35–4.50)

## 2019-03-15 MED FILL — VYVANSE 70 MG CAPSULE: 70 | 30 days supply | Qty: 30 | Fill #0

## 2019-03-15 MED FILL — ALPRAZolam 0.5 MG TABS: 0.5 | 10 days supply | Qty: 10 | Fill #2

## 2019-03-15 MED FILL — AMPHETAMINE-DEXTROAMPHETAMI: 20 | 30 days supply | Qty: 30 | Fill #0

## 2019-03-15 MED FILL — NYSTATIN 100,000 UNITS/ML S: 100000 | 7 days supply | Qty: 140 | Fill #0

## 2019-03-17 ENCOUNTER — Encounter: Payer: Self-pay | Admitting: Internal Medicine

## 2019-03-17 ENCOUNTER — Ambulatory Visit (INDEPENDENT_AMBULATORY_CARE_PROVIDER_SITE_OTHER): Payer: 59 | Admitting: Internal Medicine

## 2019-03-17 ENCOUNTER — Other Ambulatory Visit: Payer: Self-pay

## 2019-03-17 VITALS — Ht 66.5 in | Wt 212.0 lb

## 2019-03-17 DIAGNOSIS — Z20828 Contact with and (suspected) exposure to other viral communicable diseases: Secondary | ICD-10-CM | POA: Diagnosis not present

## 2019-03-17 DIAGNOSIS — D72820 Lymphocytosis (symptomatic): Secondary | ICD-10-CM | POA: Insufficient documentation

## 2019-03-17 DIAGNOSIS — R5383 Other fatigue: Secondary | ICD-10-CM | POA: Diagnosis not present

## 2019-03-17 DIAGNOSIS — M545 Low back pain, unspecified: Secondary | ICD-10-CM

## 2019-03-17 DIAGNOSIS — B370 Candidal stomatitis: Secondary | ICD-10-CM

## 2019-03-17 DIAGNOSIS — R233 Spontaneous ecchymoses: Secondary | ICD-10-CM

## 2019-03-17 DIAGNOSIS — Z20822 Contact with and (suspected) exposure to covid-19: Secondary | ICD-10-CM

## 2019-03-17 NOTE — Assessment & Plan Note (Signed)
Accompanied by fatigue, night sweats  Recurrent thrush and easy bruising .  Referral to Hematology to rule out leukemia, lymphoma and multiple  Myeloma

## 2019-03-17 NOTE — Assessment & Plan Note (Signed)
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  HAS BEEN TAKING GABAPENTIN 600 MG TID [place]

## 2019-03-17 NOTE — Assessment & Plan Note (Signed)
She remains excessively fatigued and has been having night sweats.
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
## 2019-03-18 ENCOUNTER — Ambulatory Visit: Payer: Self-pay | Admitting: *Deleted

## 2019-03-18 NOTE — Telephone Encounter (Signed)
Pt called in c/o being short of breath and her throat feeling tight.   It started suddenly about 9:15 this morning.   She also has a rash over several areas of her upper body.  She saw Dr. Derrel Nip yesterday.   Dr. Derrel Nip feels certain I have COVID.   She has taken Benadryl and Claritin along with Pepcid and Tylenol which has helped some but not much.  She was exposed to someone with COVID-19 during her clinical rounds 2 weeks ago.    She doesn't know who it was or how long.  I have referred her to the ED.   She is going to [facility] in [place].  I sent my notes to Dr. Lupita Dawn office.    Reason for Disposition . MODERATE difficulty breathing (e.g., speaks in phrases, SOB even at rest, pulse 100-120)    Shortness of breath, throat tightness and a rash on her upper body.   She was exposed to COVID-19 2 weeks ago.  Answer Assessment - Initial Assessment Questions 1. RESPIRATORY STATUS: "Describe your breathing?" (e.g., wheezing, shortness of breath, unable to speak, severe coughing)      I woke up and felt better.   I was exposed to COVID 2 wks ago today.   All of a sudden I started having trouble breathing and I have a rash and my throat feels tight.     2. ONSET: "When did this breathing problem begin?"      This morning 9:15 AM 3. PATTERN "Does the difficult breathing come and go, or has it been constant since it started?"      The throat tightness of was bad and I'm short of breath.   It feels weird. 4. SEVERITY: "How bad is your breathing?" (e.g., mild, moderate, severe)    - MILD: No SOB at rest, mild SOB with walking, speaks normally in sentences, can lay down, no retractions, pulse < 100.    - MODERATE: SOB at rest, SOB with minimal exertion and prefers to sit, cannot lie down flat, speaks in phrases, mild retractions, audible wheezing, pulse 100-120.    - SEVERE: Very SOB at rest, speaks in single words, struggling to breathe, sitting hunched forward, retractions, pulse > 120      Moderate.   5. RECURRENT SYMPTOM: "Have you had difficulty breathing before?" If so, ask: "When was the last time?" and "What happened that time?"      I had a reaction to doxycycline in the past.   I still notice the shortness of breathing as I'm sitting here. 6. CARDIAC HISTORY: "Do you have any history of heart disease?" (e.g., heart attack, angina, bypass surgery, angioplasty)      No 7. LUNG HISTORY: "Do you have any history of lung disease?"  (e.g., pulmonary embolus, asthma, emphysema)     Smoke 8. CAUSE: "What do you think is causing the breathing problem?"      Maybe the COVID-19.   Dr. Derrel Nip is pretty sure I have COVID-19.   My test won't be back until tomorrow.    I had to go to the [facility] testing center.    9. OTHER SYMPTOMS: "Do you have any other symptoms? (e.g., dizziness, runny nose, cough, chest pain, fever)     I had a 99.4 temperature yesterday.   My normal is 97.   I've been really tired.    10. PREGNANCY: "Is there any chance you are pregnant?" "When was your last menstrual period?"  No 11. TRAVEL: "Have you traveled out of the country in the last month?" (e.g., travel history, exposures)       No Exposure 2 weeks ago to [place].   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  Answer Assessment - Initial Assessment Questions 1. 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 2. 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    I don't know who had the COVID-19. 3. 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 4. DURATION of CONTACT: "How long were  you in contact with the COVID-19 patient?" (e.g., a few seconds, passed by person, a few minutes, live with the patient)     I honestly don't know. 5. DATE of CONTACT: "When did you have contact with a COVID-19 patient?" (e.g., how many days ago)     2 weeks ago 6. TRAVEL: "Have you traveled out of the country recently?" If so, "When and where?"     * Also ask about out-of-state travel, since the CDC has identified some high-risk cities for community spread in the [place].     * Note: Travel becomes less relevant if there is widespread community transmission where the patient lives.     No travel 7. COMMUNITY SPREAD: "Are there lots of cases of COVID-19 (community spread) where you live?" (See public health department website, if unsure)       Yes 8. SYMPTOMS: "Do you have any symptoms?" (e.g., fever, cough, breathing difficulty)     Shortness of breath, tight throat. 9. PREGNANCY OR POSTPARTUM: "Is there any chance you are pregnant?" "When was your last menstrual period?" "Did you deliver in the last 2 weeks?"     No 10. HIGH RISK: "Do you have any heart or lung problems? Do you have a weak immune system?" (e.g., CHF, COPD, asthma, HIV positive, chemotherapy, renal failure, diabetes mellitus, sickle cell anemia)       No.  Protocols used: CORONAVIRUS (COVID-19) DIAGNOSED OR SUSPECTED-A-AH, BREATHING DIFFICULTY-A-AH, CORONAVIRUS (COVID-19) EXPOSURE-A-AH

## 2019-03-22 MED FILL — ZOLPIDEM TART ER 12.5 MG TA: 12.5 | 30 days supply | Qty: 30 | Fill #1

## 2019-03-23 ENCOUNTER — Ambulatory Visit: Payer: 59 | Admitting: Internal Medicine

## 2019-03-23 ENCOUNTER — Telehealth: Payer: Self-pay | Admitting: Physician Assistant

## 2019-03-23 NOTE — Telephone Encounter (Signed)
Received a new hem referral from Dr. Derrel Nip for lymphocytosis. Ms. Derden has been cld and scheduled to see Cassie on 11/3 at 1:30pm. Pt aware to arrive 15 minutes early. Letter mailed.

## 2019-03-24 ENCOUNTER — Encounter: Payer: Self-pay | Admitting: Internal Medicine

## 2019-03-24 ENCOUNTER — Ambulatory Visit (INDEPENDENT_AMBULATORY_CARE_PROVIDER_SITE_OTHER): Payer: 59 | Admitting: Internal Medicine

## 2019-03-24 ENCOUNTER — Other Ambulatory Visit: Payer: Self-pay

## 2019-03-24 DIAGNOSIS — T7840XD Allergy, unspecified, subsequent encounter: Secondary | ICD-10-CM | POA: Diagnosis not present

## 2019-03-24 DIAGNOSIS — T7840XA Allergy, unspecified, initial encounter: Secondary | ICD-10-CM | POA: Insufficient documentation
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
## 2019-03-25 ENCOUNTER — Other Ambulatory Visit: Payer: Self-pay | Admitting: Internal Medicine

## 2019-03-25 MED ORDER — ALPRAZOLAM 0.5 MG PO TABS: 0.5000 mg | ORAL_TABLET | Freq: Every evening | ORAL | 1 refills | Status: DC | PRN

## 2019-03-25 MED FILL — ALPRAZolam 0.5 MG TABS: 0.5 | 30 days supply | Qty: 30 | Fill #0

## 2019-03-25 NOTE — Assessment & Plan Note (Signed)
Described by patient as skin rash accompanied by chest and throat tightness  Followed by bilateral submandibular swelling (painless).  Etiology unknown .Her recent prolonged viral illness suspected to be COVID 19  despite multiple negative tests has prevented her from having a face to face evaluation. .    Advised to continue antihistamine, H2 blocker, and keep appt with hematology for evaluation of persistent leukocytosis .  May need S/m nodes biopsied if still enlarged.

## 2019-04-06 ENCOUNTER — Ambulatory Visit (HOSPITAL_COMMUNITY)
Admission: RE | Admit: 2019-04-06 | Discharge: 2019-04-06 | Disposition: A | Discharge status: Home / Self Care | Payer: 59 | Source: Ambulatory Visit | Attending: Physician Assistant | Admitting: Physician Assistant

## 2019-04-06 ENCOUNTER — Encounter: Payer: Self-pay | Admitting: Physician Assistant

## 2019-04-06 ENCOUNTER — Other Ambulatory Visit: Payer: Self-pay | Admitting: Physician Assistant

## 2019-04-06 ENCOUNTER — Inpatient Hospital Stay: Payer: 59 | Attending: Physician Assistant | Admitting: Physician Assistant

## 2019-04-06 ENCOUNTER — Other Ambulatory Visit: Payer: Self-pay

## 2019-04-06 ENCOUNTER — Inpatient Hospital Stay: Payer: 59

## 2019-04-06 ENCOUNTER — Telehealth: Payer: Self-pay | Admitting: Physician Assistant

## 2019-04-06 VITALS — BP 161/107 | HR 106 | Temp 100.1°F | Resp 18 | Ht 66.0 in | Wt 196.9 lb

## 2019-04-06 DIAGNOSIS — D72829 Elevated white blood cell count, unspecified: Secondary | ICD-10-CM

## 2019-04-06 DIAGNOSIS — Z79899 Other long term (current) drug therapy: Secondary | ICD-10-CM | POA: Insufficient documentation

## 2019-04-06 DIAGNOSIS — R5383 Other fatigue: Secondary | ICD-10-CM

## 2019-04-06 DIAGNOSIS — Z803 Family history of malignant neoplasm of breast: Secondary | ICD-10-CM | POA: Insufficient documentation

## 2019-04-06 DIAGNOSIS — D72820 Lymphocytosis (symptomatic): Secondary | ICD-10-CM | POA: Insufficient documentation

## 2019-04-06 DIAGNOSIS — F909 Attention-deficit hyperactivity disorder, unspecified type: Secondary | ICD-10-CM | POA: Insufficient documentation

## 2019-04-06 DIAGNOSIS — F329 Major depressive disorder, single episode, unspecified: Secondary | ICD-10-CM | POA: Insufficient documentation

## 2019-04-06 DIAGNOSIS — Z791 Long term (current) use of non-steroidal anti-inflammatories (NSAID): Secondary | ICD-10-CM | POA: Insufficient documentation

## 2019-04-06 DIAGNOSIS — F17210 Nicotine dependence, cigarettes, uncomplicated: Secondary | ICD-10-CM | POA: Insufficient documentation

## 2019-04-06 DIAGNOSIS — E781 Pure hyperglyceridemia: Secondary | ICD-10-CM | POA: Insufficient documentation

## 2019-04-06 DIAGNOSIS — F419 Anxiety disorder, unspecified: Secondary | ICD-10-CM

## 2019-04-06 DIAGNOSIS — R509 Fever, unspecified: Secondary | ICD-10-CM | POA: Diagnosis not present

## 2019-04-06 HISTORY — DX: Elevated white blood cell count, unspecified: D72.829

## 2019-04-06 LAB — CMP (CANCER CENTER ONLY)
ALT: 46 U/L — ABNORMAL HIGH (ref 0–44)
AST: 29 U/L (ref 15–41)
Albumin: 4 g/dL (ref 3.5–5.0)
Alkaline Phosphatase: 75 U/L (ref 38–126)
Anion gap: 12 (ref 5–15)
BUN: 14 mg/dL (ref 6–20)
CO2: 23 mmol/L (ref 22–32)
Calcium: 9 mg/dL (ref 8.9–10.3)
Chloride: 103 mmol/L (ref 98–111)
Creatinine: 0.69 mg/dL (ref 0.44–1.00)
GFR, Est AFR Am: >60 mL/min (ref >60)
GFR, Estimated: >60 mL/min (ref >60)
Glucose, Bld: 95 mg/dL (ref 70–99)
Potassium: 3.9 mmol/L (ref 3.5–5.1)
Sodium: 138 mmol/L (ref 135–145)
Total Bilirubin: <0.2 mg/dL — ABNORMAL LOW (ref 0.3–1.2)
Total Protein: 7.2 g/dL (ref 6.5–8.1)

## 2019-04-06 LAB — CBC WITH DIFFERENTIAL (CANCER CENTER ONLY)
Abs Immature Granulocytes: 0.03 10*3/uL (ref 0.00–0.07)
Basophils Absolute: 0.1 10*3/uL (ref 0.0–0.1)
Basophils Relative: 1 %
Eosinophils Absolute: 0.2 10*3/uL (ref 0.0–0.5)
Eosinophils Relative: 2 %
HCT: 43.8 % (ref 36.0–46.0)
Hemoglobin: 14.6 g/dL (ref 12.0–15.0)
Immature Granulocytes: 0 %
Lymphocytes Relative: 35 %
Lymphs Abs: 3.9 10*3/uL (ref 0.7–4.0)
MCH: 29.4 pg (ref 26.0–34.0)
MCHC: 33.3 g/dL (ref 30.0–36.0)
MCV: 88.1 fL (ref 80.0–100.0)
Monocytes Absolute: 0.8 10*3/uL (ref 0.1–1.0)
Monocytes Relative: 7 %
Neutro Abs: 6.2 10*3/uL (ref 1.7–7.7)
Neutrophils Relative %: 55 %
Platelet Count: 338 10*3/uL (ref 150–400)
RBC: 4.97 MIL/uL (ref 3.87–5.11)
RDW: 14.1 % (ref 11.5–15.5)
WBC Count: 11.2 10*3/uL — ABNORMAL HIGH (ref 4.0–10.5)
nRBC: 0 % (ref 0.0–0.2)

## 2019-04-06 LAB — LACTATE DEHYDROGENASE: LDH: 154 U/L (ref 98–192)

## 2019-04-06 NOTE — Telephone Encounter (Signed)
Scheduled per los. Called and spoke with patient confirmed appt  
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
## 2019-04-07 ENCOUNTER — Telehealth: Payer: Self-pay | Admitting: *Deleted

## 2019-04-07 ENCOUNTER — Telehealth: Payer: Self-pay | Admitting: Physician Assistant

## 2019-04-07 DIAGNOSIS — D72820 Lymphocytosis (symptomatic): Secondary | ICD-10-CM | POA: Diagnosis not present

## 2019-04-07 NOTE — Telephone Encounter (Signed)
Called the patient but was unable to reach her. I was calling to let her know that her chest x-ray from yesterday was unremarkable. I left a voicemail for her to call us back to get her results.

## 2019-04-07 NOTE — Telephone Encounter (Signed)
Pt returned call regarding results. Per Cassie,PA notified pt Chest Xray was unremarkable. Pt verbalized understanding. No further concerns.

## 2019-04-08 LAB — ANTINUCLEAR ANTIBODIES, IFA: ANA Ab, IFA: POSITIVE — AB

## 2019-04-08 LAB — FANA STAINING PATTERNS: Homogeneous Pattern: 1:80 {titer}

## 2019-04-14 LAB — BCR ABL1 FISH (GENPATH)

## 2019-04-14 MED FILL — GABAPENTIN 600 MG TABLET: 600 | 30 days supply | Qty: 90 | Fill #2

## 2019-04-14 MED FILL — AMPHETAMINE-DEXTROAMPHETAMI: 20 | 30 days supply | Qty: 30 | Fill #0

## 2019-04-14 MED FILL — DOXEPIN 75 MG CAPSULE: 75 | 30 days supply | Qty: 60 | Fill #2

## 2019-04-14 MED FILL — traMADol HCL 50 MG TABS: 50 | 30 days supply | Qty: 90 | Fill #3

## 2019-04-14 MED FILL — CLINDAMYCIN PHOS-BENZOYL PE: 1-5 | 30 days supply | Qty: 50 | Fill #1

## 2019-04-14 MED FILL — VYVANSE 70 MG CAPSULE: 70 | 30 days supply | Qty: 30 | Fill #0

## 2019-04-15 ENCOUNTER — Other Ambulatory Visit: Payer: Self-pay | Admitting: Internal Medicine

## 2019-04-15 DIAGNOSIS — D72829 Elevated white blood cell count, unspecified: Secondary | ICD-10-CM

## 2019-04-15 DIAGNOSIS — R5383 Other fatigue: Secondary | ICD-10-CM

## 2019-04-15 DIAGNOSIS — M2550 Pain in unspecified joint: Secondary | ICD-10-CM

## 2019-04-16 ENCOUNTER — Telehealth: Payer: Self-pay | Admitting: Physician Assistant

## 2019-04-16 DIAGNOSIS — Z01419 Encounter for gynecological examination (general) (routine) without abnormal findings: Secondary | ICD-10-CM | POA: Diagnosis not present

## 2019-04-16 DIAGNOSIS — Z124 Encounter for screening for malignant neoplasm of cervix: Secondary | ICD-10-CM | POA: Diagnosis not present

## 2019-04-16 DIAGNOSIS — Z113 Encounter for screening for infections with a predominantly sexual mode of transmission: Secondary | ICD-10-CM | POA: Diagnosis not present

## 2019-04-16 NOTE — Telephone Encounter (Signed)
Scheduled appt pr 11/13 sch message- pt aware of new appt date and time

## 2019-04-21 ENCOUNTER — Inpatient Hospital Stay: Payer: 59

## 2019-04-21 ENCOUNTER — Encounter: Payer: Self-pay | Admitting: Physician Assistant

## 2019-04-21 ENCOUNTER — Inpatient Hospital Stay: Payer: 59 | Admitting: Physician Assistant

## 2019-04-21 ENCOUNTER — Other Ambulatory Visit: Payer: Self-pay

## 2019-04-21 VITALS — BP 111/82 | HR 96 | Temp 98.3°F | Resp 18 | Ht 66.0 in | Wt 193.6 lb

## 2019-04-21 DIAGNOSIS — D72820 Lymphocytosis (symptomatic): Secondary | ICD-10-CM

## 2019-04-21 DIAGNOSIS — F329 Major depressive disorder, single episode, unspecified: Secondary | ICD-10-CM | POA: Diagnosis not present

## 2019-04-21 DIAGNOSIS — E781 Pure hyperglyceridemia: Secondary | ICD-10-CM | POA: Diagnosis not present

## 2019-04-21 DIAGNOSIS — Z79899 Other long term (current) drug therapy: Secondary | ICD-10-CM | POA: Diagnosis not present

## 2019-04-21 DIAGNOSIS — Z803 Family history of malignant neoplasm of breast: Secondary | ICD-10-CM | POA: Diagnosis not present

## 2019-04-21 DIAGNOSIS — Z791 Long term (current) use of non-steroidal anti-inflammatories (NSAID): Secondary | ICD-10-CM | POA: Diagnosis not present

## 2019-04-21 DIAGNOSIS — D72829 Elevated white blood cell count, unspecified: Secondary | ICD-10-CM | POA: Diagnosis not present

## 2019-04-21 DIAGNOSIS — F17210 Nicotine dependence, cigarettes, uncomplicated: Secondary | ICD-10-CM | POA: Diagnosis not present

## 2019-04-21 DIAGNOSIS — F909 Attention-deficit hyperactivity disorder, unspecified type: Secondary | ICD-10-CM | POA: Diagnosis not present

## 2019-04-21 LAB — CBC WITH DIFFERENTIAL (CANCER CENTER ONLY)
Abs Immature Granulocytes: 0.03 10*3/uL (ref 0.00–0.07)
Basophils Absolute: 0.1 10*3/uL (ref 0.0–0.1)
Basophils Relative: 1 %
Eosinophils Absolute: 0.3 10*3/uL (ref 0.0–0.5)
Eosinophils Relative: 3 %
HCT: 43 % (ref 36.0–46.0)
Hemoglobin: 14.3 g/dL (ref 12.0–15.0)
Immature Granulocytes: 0 %
Lymphocytes Relative: 31 %
Lymphs Abs: 3.1 10*3/uL (ref 0.7–4.0)
MCH: 29.6 pg (ref 26.0–34.0)
MCHC: 33.3 g/dL (ref 30.0–36.0)
MCV: 89 fL (ref 80.0–100.0)
Monocytes Absolute: 0.7 10*3/uL (ref 0.1–1.0)
Monocytes Relative: 6 %
Neutro Abs: 6.1 10*3/uL (ref 1.7–7.7)
Neutrophils Relative %: 59 %
Platelet Count: 332 10*3/uL (ref 150–400)
RBC: 4.83 MIL/uL (ref 3.87–5.11)
RDW: 13.9 % (ref 11.5–15.5)
WBC Count: 10.2 10*3/uL (ref 4.0–10.5)
nRBC: 0 % (ref 0.0–0.2)

## 2019-04-21 MED FILL — ZOLPIDEM TART ER 12.5 MG TA: 12.5 | 30 days supply | Qty: 30 | Fill #2
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
## 2019-04-24 ENCOUNTER — Emergency Department (HOSPITAL_BASED_OUTPATIENT_CLINIC_OR_DEPARTMENT_OTHER): Payer: 59

## 2019-04-24 ENCOUNTER — Encounter (HOSPITAL_COMMUNITY): Payer: Self-pay

## 2019-04-24 ENCOUNTER — Other Ambulatory Visit: Payer: Self-pay

## 2019-04-24 ENCOUNTER — Emergency Department (HOSPITAL_COMMUNITY)
Admission: EM | Admit: 2019-04-24 | Discharge: 2019-04-24 | Disposition: A | Discharge status: Home / Self Care | Payer: 59 | Attending: Emergency Medicine | Admitting: Emergency Medicine

## 2019-04-24 DIAGNOSIS — M79604 Pain in right leg: Secondary | ICD-10-CM | POA: Diagnosis not present

## 2019-04-24 DIAGNOSIS — Z79899 Other long term (current) drug therapy: Secondary | ICD-10-CM | POA: Diagnosis not present

## 2019-04-24 DIAGNOSIS — F17210 Nicotine dependence, cigarettes, uncomplicated: Secondary | ICD-10-CM | POA: Diagnosis not present

## 2019-04-24 DIAGNOSIS — R52 Pain, unspecified: Secondary | ICD-10-CM | POA: Diagnosis not present

## 2019-04-24 DIAGNOSIS — R000 Tachycardia, unspecified: Secondary | ICD-10-CM | POA: Diagnosis not present

## 2019-04-24 NOTE — Discharge Instructions (Addendum)
The Doppler did not show a blood clot.  Potentially could be a reaction to something on the skin.  Watch for worsening redness or swelling.  Follow-up with your doctor as needed.

## 2019-04-24 NOTE — Progress Notes (Signed)
VASCULAR LAB PRELIMINARY  PRELIMINARY  PRELIMINARY  PRELIMINARY  Right lower extremity venous duplex completed.    Preliminary report:  See CV proc for preliminary results.  Gave Dr. Alvino Chapel results.  Jahari Wiginton, RVT 04/24/2019, 11:33 AM

## 2019-04-24 NOTE — ED Triage Notes (Addendum)
Pt presents with c/o lower right leg cramping and numbness. Pt reports she felt a weird sensation around 4 am this morning in her lower right leg. Pt reports the area was red and swollen, resolved at this time. Pt also c/o numbness between her shoulder blades. Pt is tachycardic in triage, 128 bpm. Pt is being currently worked up at the cancer center, rule-out cancer/lupus.
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
## 2019-04-27 ENCOUNTER — Other Ambulatory Visit: Payer: 59

## 2019-04-27 ENCOUNTER — Ambulatory Visit: Payer: 59 | Admitting: Internal Medicine

## 2019-05-05 MED FILL — ALPRAZolam 0.5 MG TABS: 0.5 | 30 days supply | Qty: 30 | Fill #1

## 2019-05-12 DIAGNOSIS — F919 Conduct disorder, unspecified: Secondary | ICD-10-CM | POA: Diagnosis not present

## 2019-05-12 DIAGNOSIS — F4000 Agoraphobia, unspecified: Secondary | ICD-10-CM | POA: Diagnosis not present

## 2019-05-12 DIAGNOSIS — F329 Major depressive disorder, single episode, unspecified: Secondary | ICD-10-CM | POA: Diagnosis not present

## 2019-05-13 MED FILL — AMPHETAMINE-DEXTROAMPHETAMI: 20 | 30 days supply | Qty: 30 | Fill #0

## 2019-05-13 MED FILL — VYVANSE 70 MG CAPSULE: 70 | 30 days supply | Qty: 30 | Fill #0

## 2019-05-20 ENCOUNTER — Other Ambulatory Visit: Payer: Self-pay | Admitting: Internal Medicine

## 2019-05-20 DIAGNOSIS — Z760 Encounter for issue of repeat prescription: Secondary | ICD-10-CM

## 2019-05-20 MED FILL — GABAPENTIN 600 MG TABLET: 600 | 30 days supply | Qty: 90 | Fill #3

## 2019-05-20 MED FILL — ZOLPIDEM TART ER 12.5 MG TA: 12.5 | 30 days supply | Qty: 30 | Fill #0

## 2019-05-20 NOTE — Telephone Encounter (Signed)
LOV 03/24/19

## 2019-05-21 MED FILL — traMADol HCL 50 MG TABS: 50 | 30 days supply | Qty: 90 | Fill #0

## 2019-06-10 MED FILL — AMPHETAMINE-DEXTROAMPHETAMI: 20 | 30 days supply | Qty: 30 | Fill #0

## 2019-06-10 MED FILL — VYVANSE 70 MG CAPSULE: 70 | 30 days supply | Qty: 30 | Fill #0

## 2019-06-14 NOTE — Progress Notes (Signed)
Virtual Visit via Video Note  I connected with Sonia Holden on 06/15/19 at  9:45 AM EST by a video enabled telemedicine application and verified that I am speaking with the correct person using two identifiers.  Location: Patient: Home  Provider: Clinic  This service was conducted via virtual visit.  Both audio and visual tools were used.  The patient was located at home. I was located in my office.  Consent was obtained prior to the virtual visit and is aware of possible charges through their insurance for this visit.  The patient is an established patient.  Dr. Estanislado Pandy, MD conducted the virtual visit and [name], PA-C acted as scribe during the service.  Office staff helped with scheduling follow up visits after the service was conducted.   I discussed the limitations of evaluation and management by telemedicine and the availability of in person appointments. The patient expressed understanding and agreed to proceed.  CC: History of Present Illness: Patient is a 34 year old female with a past medical history of positive ANA.  According to patient her symptoms started in August 2020.  She states she was under a lot of stress as she is working in the [employer] unit and also going to [employer] school.  She states she started experiencing bleeding while she was shaving her legs and also petechiae when she was waxing.  She also noticed increased bruising.  She states she was seen by her PCP and was referred to hematology where the work-up was negative except for mild elevation of WBCs was noted.  She also requested ANA test which was positive.  She states there is positive family history of lupus in her grandmother's cousin.  She also thinks there might be some family members with rheumatoid arthritis.  She states in September she had prolonged.  For which she had to take some medication to stop her periods.  Her fatigue gradually started improving although in October she started  having rash on her chest and her face.  She states it was an allergic reaction and after taking some over-the-counter allergy medications the symptoms improved but the rash on her chest persist.  She had also noted submandibular lymphadenopathy at the time.  She states that her fatigue is much better now.  She had been seeing Dr. Stann Mainland for the last few years due to lower back pain.  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  She also had right knee joint meniscal tear injury in the past which bothers her off and on.  She denies any history of joint swelling.  She gives history of occasional oral ulcers, dry mouth and dry eyes which she relates to taking medications.  There is no history of Raynaud's phenomenon.  Review of Systems  Constitutional: Positive for malaise/fatigue. Negative for fever.  HENT:       +Mouth dryness  Eyes: Negative for photophobia, pain, discharge and redness.       +Eye dryness  Respiratory: Negative for cough, shortness of breath and wheezing.   Cardiovascular: Negative for chest pain and palpitations.  Gastrointestinal: Negative for blood in stool, constipation and diarrhea.  Genitourinary: Negative for dysuria.  Musculoskeletal: Positive for joint pain. Negative for back pain, myalgias and neck pain.       +Morning stiffness +Muscle tenderness  Skin: Positive for rash.  Neurological: Negative for dizziness and headaches.  Endo/Heme/Allergies: Bruises/bleeds easily.  Psychiatric/Behavioral: Positive for depression. The patient is nervous/anxious  and has insomnia.       Observations/Objective: Physical Exam  Constitutional: She is oriented to person, place, and time and well-developed, well-nourished, and in no distress.  HENT:  Head: Normocephalic and atraumatic.  Eyes: Conjunctivae are normal.  Pulmonary/Chest: Effort normal.  Neurological: She is alert and oriented to person, place, and time.  Psychiatric: Mood,  memory, affect and judgment normal.   Patient reports morning stiffness for  0 minutes.   Patient denies nocturnal pain.  Difficulty dressing/grooming: Denies Difficulty climbing stairs: Denies Difficulty getting out of chair: Denies Difficulty using hands for taps, buttons, cutlery, and/or writing: Denies   Assessment and Plan: Diagnoses and all orders for this visit:  Positive ANA (antinuclear antibody)- Patient had recent labs done by her hematologist which showed positive ANA.  No titer was given.  Patient is concerned about autoimmune disease and she has been experiencing fatigue and there has been family history of autoimmune disease.  She has been also experiencing a rash although she thought it was an allergic reaction.  She has infrequent oral ulcers.  She has joint pain which is mostly related to injuries in her disc problems.  She denies any joint swelling.  To complete the work-up will obtain AVISE labs.  Patient will come tomorrow to pick up the lab slip.  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  Her fatigue has improved recently.  She has taken a semester off.  Rupture of anterior cruciate ligament of right knee, sequela-patient complains of ongoing discomfort in her knee joint and intermittent swelling.  She has had cortisone injection in the past by Dr. Stann Mainland and April.  Lumbar back pain-patient states that she has history of some disc issues on the x-ray in the past.  I do not have those records to review.  I looked at the x-ray report of lumbar spine from February 01, 2013 which was unremarkable.  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  Lymphocytosis -mild elevation of WBC count was noted while she saw the hematologist.  The last WBC count was normal.  I reviewed  hematology records.  Hypertriglyceridemia  Family history of breast cancer in first degree relative    Follow Up Instructions: She will follow up in 3 to 4 weeks   I discussed the assessment and treatment plan with the patient. The patient was provided an opportunity to ask questions and all were answered. The patient agreed with the plan and demonstrated an understanding of the instructions.   The patient was advised to call back or seek an in-person evaluation if the symptoms worsen or if the condition fails to improve as anticipated.  I provided 45 minutes of non-face-to-face time during this encounter.   Bo Merino, MD

## 2019-06-15 ENCOUNTER — Encounter: Payer: Self-pay | Admitting: Rheumatology

## 2019-06-15 ENCOUNTER — Other Ambulatory Visit: Payer: Self-pay

## 2019-06-15 ENCOUNTER — Telehealth (INDEPENDENT_AMBULATORY_CARE_PROVIDER_SITE_OTHER): Payer: 59 | Admitting: Rheumatology

## 2019-06-15 DIAGNOSIS — Z803 Family history of malignant neoplasm of breast: Secondary | ICD-10-CM | POA: Diagnosis not present

## 2019-06-15 DIAGNOSIS — R768 Other specified abnormal immunological findings in serum: Secondary | ICD-10-CM

## 2019-06-15 DIAGNOSIS — S83511S Sprain of anterior cruciate ligament of right knee, sequela: Secondary | ICD-10-CM

## 2019-06-15 DIAGNOSIS — R5383 Other fatigue: Secondary | ICD-10-CM | POA: Diagnosis not present

## 2019-06-15 DIAGNOSIS — D72820 Lymphocytosis (symptomatic): Secondary | ICD-10-CM

## 2019-06-15 DIAGNOSIS — E781 Pure hyperglyceridemia: Secondary | ICD-10-CM | POA: Diagnosis not present

## 2019-06-15 DIAGNOSIS — M545 Low back pain, unspecified: Secondary | ICD-10-CM

## 2019-06-15 DIAGNOSIS — F5105 Insomnia due to other mental disorder: Secondary | ICD-10-CM | POA: Diagnosis not present

## 2019-06-15 DIAGNOSIS — F419 Anxiety disorder, unspecified: Secondary | ICD-10-CM | POA: Diagnosis not present
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
## 2019-06-21 ENCOUNTER — Other Ambulatory Visit: Payer: Self-pay | Admitting: Internal Medicine

## 2019-06-21 MED FILL — DOXEPIN 75 MG CAPSULE: 75 | 30 days supply | Qty: 60 | Fill #3

## 2019-06-21 MED FILL — traMADol HCL 50 MG TABS: 50 | 30 days supply | Qty: 90 | Fill #1

## 2019-06-21 MED FILL — ALPRAZolam 0.5 MG TABS: 0.5 | 30 days supply | Qty: 30 | Fill #0

## 2019-06-21 MED FILL — ZOLPIDEM TART ER 12.5 MG TA: 12.5 | 30 days supply | Qty: 30 | Fill #1

## 2019-06-21 MED FILL — GABAPENTIN 600 MG TABLET: 600 | 30 days supply | Qty: 90 | Fill #0

## 2019-06-21 NOTE — Telephone Encounter (Signed)
Refilled: 03/25/2019 Last OV: 03/24/2019 Next OV: not scheduled

## 2019-06-22 ENCOUNTER — Encounter: Payer: Self-pay | Admitting: Rheumatology

## 2019-06-22 DIAGNOSIS — R768 Other specified abnormal immunological findings in serum: Secondary | ICD-10-CM | POA: Diagnosis not present

## 2019-06-29 ENCOUNTER — Other Ambulatory Visit: Payer: Self-pay | Admitting: Internal Medicine

## 2019-06-29 MED FILL — valACYclovir HCL 1 GM TABS: 1 | 15 days supply | Qty: 30 | Fill #0

## 2019-06-30 MED FILL — CYCLOBENZAPRINE HCL 10 MG T: 10 | 10 days supply | Qty: 30 | Fill #0
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
## 2019-07-13 ENCOUNTER — Ambulatory Visit: Payer: 59 | Admitting: Rheumatology

## 2019-07-13 MED FILL — AMPHETAMINE-DEXTROAMPHETAMI: 20 | 30 days supply | Qty: 30 | Fill #0

## 2019-07-13 MED FILL — VYVANSE 70 MG CAPSULE: 70 | 30 days supply | Qty: 30 | Fill #0
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
## 2019-07-21 MED FILL — traMADol HCL 50 MG TABS: 50 | 30 days supply | Qty: 90 | Fill #2

## 2019-07-21 MED FILL — ALPRAZolam 0.5 MG TABS: 0.5 | 30 days supply | Qty: 30 | Fill #1

## 2019-07-21 MED FILL — ZOLPIDEM TART ER 12.5 MG TA: 12.5 | 30 days supply | Qty: 30 | Fill #2

## 2019-07-28 ENCOUNTER — Encounter: Payer: Self-pay | Admitting: Rheumatology

## 2019-07-28 ENCOUNTER — Other Ambulatory Visit: Payer: Self-pay

## 2019-07-28 ENCOUNTER — Ambulatory Visit: Payer: 59 | Admitting: Rheumatology

## 2019-07-28 VITALS — BP 131/82 | HR 118 | Resp 14 | Ht 66.0 in | Wt 190.4 lb

## 2019-07-28 DIAGNOSIS — G8929 Other chronic pain: Secondary | ICD-10-CM

## 2019-07-28 DIAGNOSIS — M545 Low back pain, unspecified: Secondary | ICD-10-CM

## 2019-07-28 DIAGNOSIS — E781 Pure hyperglyceridemia: Secondary | ICD-10-CM

## 2019-07-28 DIAGNOSIS — F5105 Insomnia due to other mental disorder: Secondary | ICD-10-CM

## 2019-07-28 DIAGNOSIS — M25561 Pain in right knee: Secondary | ICD-10-CM | POA: Diagnosis not present

## 2019-07-28 DIAGNOSIS — Z803 Family history of malignant neoplasm of breast: Secondary | ICD-10-CM

## 2019-07-28 DIAGNOSIS — R768 Other specified abnormal immunological findings in serum: Secondary | ICD-10-CM | POA: Diagnosis not present

## 2019-07-28 DIAGNOSIS — S83511S Sprain of anterior cruciate ligament of right knee, sequela: Secondary | ICD-10-CM | POA: Diagnosis not present

## 2019-07-28 DIAGNOSIS — R5383 Other fatigue: Secondary | ICD-10-CM

## 2019-07-28 DIAGNOSIS — F419 Anxiety disorder, unspecified: Secondary | ICD-10-CM

## 2019-08-10 MED FILL — VYVANSE 70 MG CAPSULE: 70 | 30 days supply | Qty: 30 | Fill #0

## 2019-08-10 MED FILL — AMPHETAMINE-DEXTROAMPHETAMI: 20 | 30 days supply | Qty: 30 | Fill #0

## 2019-08-10 MED FILL — NYSTATIN 100,000 UNITS/ML S: 100000 | 7 days supply | Qty: 140 | Fill #1

## 2019-08-11 MED FILL — GABAPENTIN 600 MG TABLET: 600 | 30 days supply | Qty: 90 | Fill #1

## 2019-08-11 MED FILL — TRETINOIN GEL MICRO 0.1% TU: 0.1 | 30 days supply | Qty: 45 | Fill #2

## 2019-08-13 DIAGNOSIS — M9902 Segmental and somatic dysfunction of thoracic region: Secondary | ICD-10-CM | POA: Diagnosis not present

## 2019-08-13 DIAGNOSIS — M62830 Muscle spasm of back: Secondary | ICD-10-CM | POA: Diagnosis not present

## 2019-08-13 DIAGNOSIS — M9903 Segmental and somatic dysfunction of lumbar region: Secondary | ICD-10-CM | POA: Diagnosis not present

## 2019-08-13 DIAGNOSIS — M9905 Segmental and somatic dysfunction of pelvic region: Secondary | ICD-10-CM | POA: Diagnosis not present

## 2019-08-17 MED FILL — ZOLPIDEM TART ER 12.5 MG TA: 12.5 | 30 days supply | Qty: 30 | Fill #3

## 2019-08-18 DIAGNOSIS — M9902 Segmental and somatic dysfunction of thoracic region: Secondary | ICD-10-CM | POA: Diagnosis not present

## 2019-08-18 DIAGNOSIS — M62830 Muscle spasm of back: Secondary | ICD-10-CM | POA: Diagnosis not present

## 2019-08-18 DIAGNOSIS — M9905 Segmental and somatic dysfunction of pelvic region: Secondary | ICD-10-CM | POA: Diagnosis not present

## 2019-08-18 DIAGNOSIS — M9903 Segmental and somatic dysfunction of lumbar region: Secondary | ICD-10-CM | POA: Diagnosis not present

## 2019-08-20 DIAGNOSIS — M9905 Segmental and somatic dysfunction of pelvic region: Secondary | ICD-10-CM | POA: Diagnosis not present

## 2019-08-20 DIAGNOSIS — M9903 Segmental and somatic dysfunction of lumbar region: Secondary | ICD-10-CM | POA: Diagnosis not present

## 2019-08-20 DIAGNOSIS — M9902 Segmental and somatic dysfunction of thoracic region: Secondary | ICD-10-CM | POA: Diagnosis not present

## 2019-08-20 DIAGNOSIS — M62830 Muscle spasm of back: Secondary | ICD-10-CM | POA: Diagnosis not present

## 2019-08-31 ENCOUNTER — Other Ambulatory Visit: Payer: Self-pay | Admitting: Internal Medicine

## 2019-08-31 MED FILL — traMADol HCL 50 MG TABS: 50 | 30 days supply | Qty: 90 | Fill #3

## 2019-08-31 MED FILL — CYCLOBENZAPRINE HCL 10 MG T: 10 | 10 days supply | Qty: 30 | Fill #1

## 2019-08-31 NOTE — Telephone Encounter (Signed)
Refill request for xanax, last seen 03/24/19, last filled 06-21-19.  Please advise.

## 2019-09-01 MED FILL — DOXEPIN 75 MG CAPSULE: 75 | 30 days supply | Qty: 60 | Fill #0

## 2019-09-02 MED FILL — ALPRAZolam 0.5 MG TABS: 0.5 | 30 days supply | Qty: 30 | Fill #0

## 2019-09-02 NOTE — Telephone Encounter (Signed)
Refill for 30 days only.  OFFICE VISIT NEEDED prior to any more refills 

## 2019-09-09 MED FILL — VYVANSE 70 MG CAPSULE: 70 | 30 days supply | Qty: 30 | Fill #0

## 2019-09-09 MED FILL — AMPHETAMINE-DEXTROAMPHETAMI: 20 | 30 days supply | Qty: 30 | Fill #0

## 2019-09-24 DIAGNOSIS — M9903 Segmental and somatic dysfunction of lumbar region: Secondary | ICD-10-CM | POA: Diagnosis not present

## 2019-09-24 DIAGNOSIS — M9902 Segmental and somatic dysfunction of thoracic region: Secondary | ICD-10-CM | POA: Diagnosis not present

## 2019-09-24 DIAGNOSIS — M62830 Muscle spasm of back: Secondary | ICD-10-CM | POA: Diagnosis not present

## 2019-09-24 DIAGNOSIS — M9905 Segmental and somatic dysfunction of pelvic region: Secondary | ICD-10-CM | POA: Diagnosis not present

## 2019-09-27 ENCOUNTER — Other Ambulatory Visit: Payer: Self-pay | Admitting: Internal Medicine

## 2019-09-27 DIAGNOSIS — Z760 Encounter for issue of repeat prescription: Secondary | ICD-10-CM

## 2019-09-27 MED FILL — GABAPENTIN 600 MG TABLET: 600 | 30 days supply | Qty: 90 | Fill #2

## 2019-09-28 NOTE — Telephone Encounter (Signed)
Alprazolam   Refilled: 09/02/2019  Tramadol   Refilled: 05/21/2019  Last OV: 03/24/2019 Next OV: not scheduled

## 2019-09-28 NOTE — Telephone Encounter (Signed)
No refills  On tramadol per controlled substance contract  6 month OV needed

## 2019-09-30 MED FILL — ZOLPIDEM TART ER 12.5 MG TA: 12.5 | 30 days supply | Qty: 30 | Fill #0

## 2019-10-01 DIAGNOSIS — M62830 Muscle spasm of back: Secondary | ICD-10-CM | POA: Diagnosis not present

## 2019-10-01 DIAGNOSIS — M9903 Segmental and somatic dysfunction of lumbar region: Secondary | ICD-10-CM | POA: Diagnosis not present

## 2019-10-01 DIAGNOSIS — M9902 Segmental and somatic dysfunction of thoracic region: Secondary | ICD-10-CM | POA: Diagnosis not present

## 2019-10-01 DIAGNOSIS — M9905 Segmental and somatic dysfunction of pelvic region: Secondary | ICD-10-CM | POA: Diagnosis not present

## 2019-10-06 ENCOUNTER — Encounter: Payer: Self-pay | Admitting: Internal Medicine

## 2019-10-06 ENCOUNTER — Telehealth (INDEPENDENT_AMBULATORY_CARE_PROVIDER_SITE_OTHER): Payer: 59 | Admitting: Internal Medicine

## 2019-10-06 ENCOUNTER — Other Ambulatory Visit: Payer: Self-pay | Admitting: Internal Medicine

## 2019-10-06 VITALS — Ht 66.0 in | Wt 190.4 lb

## 2019-10-06 DIAGNOSIS — M545 Low back pain, unspecified: Secondary | ICD-10-CM

## 2019-10-06 DIAGNOSIS — F5105 Insomnia due to other mental disorder: Secondary | ICD-10-CM | POA: Diagnosis not present

## 2019-10-06 DIAGNOSIS — Z760 Encounter for issue of repeat prescription: Secondary | ICD-10-CM

## 2019-10-06 DIAGNOSIS — R748 Abnormal levels of other serum enzymes: Secondary | ICD-10-CM | POA: Diagnosis not present

## 2019-10-06 DIAGNOSIS — F902 Attention-deficit hyperactivity disorder, combined type: Secondary | ICD-10-CM

## 2019-10-06 DIAGNOSIS — D72829 Elevated white blood cell count, unspecified: Secondary | ICD-10-CM | POA: Diagnosis not present

## 2019-10-06 DIAGNOSIS — F419 Anxiety disorder, unspecified: Secondary | ICD-10-CM | POA: Diagnosis not present

## 2019-10-06 MED ORDER — ALPRAZOLAM 0.5 MG PO TABS: 0.5000 mg | ORAL_TABLET | Freq: Every evening | ORAL | 0 refills | Status: DC | PRN

## 2019-10-06 MED ORDER — TRAMADOL HCL 50 MG PO TABS: 50.0000 mg | ORAL_TABLET | Freq: Three times a day (TID) | ORAL | 5 refills | Status: DC | PRN

## 2019-10-06 MED ORDER — GABAPENTIN 600 MG PO TABS: 600.0000 mg | ORAL_TABLET | Freq: Three times a day (TID) | ORAL | 2 refills | Status: DC

## 2019-10-06 MED FILL — traMADol HCL 50 MG TABS: 50 | 30 days supply | Qty: 90 | Fill #0

## 2019-10-06 MED FILL — ALPRAZolam 0.5 MG TABS: 0.5 | 30 days supply | Qty: 30 | Fill #0

## 2019-10-06 NOTE — Assessment & Plan Note (Signed)
Managed by psychiatry and vyvanse and adderall ,  I have advised patient to continue to follow up with psychiatry for management

## 2019-10-06 NOTE — Assessment & Plan Note (Signed)
I have advised her that I will refill the alprazolam for another thirty days until she can talk to her psychiatrist about managing the prescription since hse is prescribing the stimulants and [place]

## 2019-10-06 NOTE — Telephone Encounter (Signed)
Pt has been scheduled for an appt with Dr. Derrel Nip today.

## 2019-10-06 NOTE — Assessment & Plan Note (Signed)
Per hematology reactive..  No evidence of of lymphoproliferative process
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
## 2019-10-06 NOTE — Assessment & Plan Note (Signed)
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  HAS BEEN TAKING GABAPENTIN 600 MG TID SINCE January, and tramadol for pain relief,  Refills given for 6 months

## 2019-10-21 ENCOUNTER — Ambulatory Visit: Payer: 59 | Admitting: Dermatology

## 2019-10-21 ENCOUNTER — Encounter: Payer: Self-pay | Admitting: Dermatology

## 2019-10-21 ENCOUNTER — Other Ambulatory Visit: Payer: Self-pay

## 2019-10-21 DIAGNOSIS — D492 Neoplasm of unspecified behavior of bone, soft tissue, and skin: Secondary | ICD-10-CM

## 2019-10-21 DIAGNOSIS — D225 Melanocytic nevi of trunk: Secondary | ICD-10-CM | POA: Diagnosis not present

## 2019-10-21 DIAGNOSIS — D485 Neoplasm of uncertain behavior of skin: Secondary | ICD-10-CM | POA: Diagnosis not present

## 2019-10-21 NOTE — Patient Instructions (Signed)

## 2019-10-21 NOTE — Progress Notes (Signed)
   Follow-Up Visit   Subjective  Sonia Holden is a 34 y.o. female who presents for the following: check spot (R flank/axillary, noticed ~3days ago, irregular looking).    The following portions of the chart were reviewed this encounter and updated as appropriate:  Tobacco  Allergies  Meds  Problems  Med Hx  Surg Hx  Fam Hx      Review of Systems:  No other skin or systemic complaints except as noted in HPI or Assessment and Plan.  Objective  Well appearing patient in no apparent distress; mood and affect are within normal limits.  A focused examination was performed including R side/axillary area. Relevant physical exam findings are noted in the Assessment and Plan.  Objective  R side infra axillary: 0.4cm dark pap   Assessment & Plan  Neoplasm of skin R side infra axillary  Epidermal / dermal shaving  Lesion length (cm):  0.4 Lesion width (cm):  0.4 Margin per side (cm):  0.2 Total excision diameter (cm):  0.8 Informed consent: discussed and consent obtained   Timeout: patient name, date of birth, surgical site, and procedure verified   Procedure prep:  Patient was prepped and draped in usual sterile fashion Prep type:  Isopropyl alcohol Anesthesia: the lesion was anesthetized in a standard fashion   Anesthetic:  1% lidocaine w/ epinephrine 1-100,000 buffered w/ 8.4% NaHCO3 Instrument used: flexible razor blade   Hemostasis achieved with: pressure, aluminum chloride and electrodesiccation   Outcome: patient tolerated procedure well   Post-procedure details: sterile dressing applied and wound care instructions given   Dressing type: bandage and petrolatum    Specimen 1 - Surgical pathology Differential Diagnosis: Nevus vs Dysplastic vs Hemangioma Check Margins: yes 0.4cm dark pap  Nevus vs Dysplastic vs Hemangioma  Return for To be scheduled with Dr. Laurence Ferrari for TBSE hx of Dysplastic.   I, Othelia Pulling, RMA, am acting as scribe for Sarina Ser, MD . Documentation: I have reviewed the above documentation for accuracy and completeness, and I agree with the above.  Sarina Ser, MD

## 2019-10-24 ENCOUNTER — Encounter: Payer: Self-pay | Admitting: Dermatology

## 2019-10-25 ENCOUNTER — Telehealth: Payer: Self-pay

## 2019-10-25 NOTE — Telephone Encounter (Signed)
-----   Message from Ralene Bathe, MD sent at 10/24/2019  2:35 PM EDT ----- Skin , right side infra axillary MELANOCYTIC NEVUS, COMPOUND TYPE, IRRITATED  Benign irritated mole

## 2019-10-25 NOTE — Telephone Encounter (Signed)
Advised pt of bx results/sh ?

## 2019-10-30 MED FILL — DOXEPIN 75 MG CAPSULE: 75 | 30 days supply | Qty: 60 | Fill #1

## 2019-10-30 MED FILL — GABAPENTIN 600 MG TABLET: 600 | 30 days supply | Qty: 90 | Fill #3

## 2019-11-01 MED FILL — ZOLPIDEM TART ER 12.5 MG TA: 12.5 | 30 days supply | Qty: 30 | Fill #1

## 2019-11-10 DIAGNOSIS — F4000 Agoraphobia, unspecified: Secondary | ICD-10-CM | POA: Diagnosis not present

## 2019-11-10 DIAGNOSIS — Z113 Encounter for screening for infections with a predominantly sexual mode of transmission: Secondary | ICD-10-CM | POA: Diagnosis not present

## 2019-11-10 DIAGNOSIS — F919 Conduct disorder, unspecified: Secondary | ICD-10-CM | POA: Diagnosis not present

## 2019-11-10 DIAGNOSIS — Z1231 Encounter for screening mammogram for malignant neoplasm of breast: Secondary | ICD-10-CM | POA: Diagnosis not present

## 2019-11-10 DIAGNOSIS — N871 Moderate cervical dysplasia: Secondary | ICD-10-CM | POA: Diagnosis not present

## 2019-11-10 DIAGNOSIS — Z803 Family history of malignant neoplasm of breast: Secondary | ICD-10-CM | POA: Diagnosis not present

## 2019-11-10 DIAGNOSIS — F329 Major depressive disorder, single episode, unspecified: Secondary | ICD-10-CM | POA: Diagnosis not present

## 2019-11-15 ENCOUNTER — Other Ambulatory Visit: Payer: Self-pay | Admitting: Obstetrics and Gynecology

## 2019-11-15 DIAGNOSIS — R928 Other abnormal and inconclusive findings on diagnostic imaging of breast: Secondary | ICD-10-CM

## 2019-11-15 MED FILL — traMADol HCL 50 MG TABS: 50 | 30 days supply | Qty: 90 | Fill #1

## 2019-11-17 MED FILL — ALPRAZolam 0.5 MG TABS: 0.5 | 30 days supply | Qty: 30 | Fill #0

## 2019-11-26 ENCOUNTER — Ambulatory Visit
Admission: RE | Admit: 2019-11-26 | Discharge: 2019-11-26 | Disposition: A | Discharge status: Home / Self Care | Payer: 59 | Source: Ambulatory Visit | Attending: Obstetrics and Gynecology | Admitting: Obstetrics and Gynecology

## 2019-11-26 ENCOUNTER — Other Ambulatory Visit: Payer: Self-pay

## 2019-11-26 DIAGNOSIS — R928 Other abnormal and inconclusive findings on diagnostic imaging of breast: Secondary | ICD-10-CM

## 2019-11-26 DIAGNOSIS — N6489 Other specified disorders of breast: Secondary | ICD-10-CM | POA: Diagnosis not present

## 2019-12-01 DIAGNOSIS — Z23 Encounter for immunization: Secondary | ICD-10-CM | POA: Diagnosis not present

## 2019-12-01 MED FILL — ZOLPIDEM TART ER 12.5 MG TA: 12.5 | 30 days supply | Qty: 30 | Fill #2

## 2019-12-01 MED FILL — NYSTATIN 100,000 UNITS/ML S: 100000 | 7 days supply | Qty: 140 | Fill #2

## 2019-12-02 ENCOUNTER — Other Ambulatory Visit: Payer: Self-pay

## 2019-12-02 MED ORDER — VALACYCLOVIR HCL 1 G PO TABS: 1.0000 g | ORAL_TABLET | ORAL | 0 refills | Status: DC

## 2019-12-02 MED FILL — valACYclovir HCL 1 GM TABS: 1 | 7 days supply | Qty: 30 | Fill #0

## 2019-12-02 NOTE — Progress Notes (Signed)
Valtrex refill request approved.

## 2019-12-13 MED FILL — AMPHETAMINE-DEXTROAMPHETAMI: 20 | 30 days supply | Qty: 30 | Fill #0

## 2019-12-13 MED FILL — VYVANSE 70 MG CAPSULE: 70 | 30 days supply | Qty: 30 | Fill #0

## 2019-12-16 MED FILL — traMADol HCL 50 MG TABS: 50 | 30 days supply | Qty: 90 | Fill #2

## 2019-12-17 ENCOUNTER — Other Ambulatory Visit (HOSPITAL_COMMUNITY): Payer: Self-pay | Admitting: Orthopedic Surgery

## 2019-12-17 MED FILL — GABAPENTIN 600 MG TABLET: 600 | 30 days supply | Qty: 90 | Fill #0

## 2019-12-28 DIAGNOSIS — Z13220 Encounter for screening for lipoid disorders: Secondary | ICD-10-CM | POA: Diagnosis not present

## 2019-12-28 DIAGNOSIS — Z1329 Encounter for screening for other suspected endocrine disorder: Secondary | ICD-10-CM | POA: Diagnosis not present

## 2019-12-28 DIAGNOSIS — R58 Hemorrhage, not elsewhere classified: Secondary | ICD-10-CM | POA: Diagnosis not present

## 2019-12-28 DIAGNOSIS — Z131 Encounter for screening for diabetes mellitus: Secondary | ICD-10-CM | POA: Diagnosis not present

## 2019-12-30 DIAGNOSIS — R350 Frequency of micturition: Secondary | ICD-10-CM | POA: Diagnosis not present

## 2019-12-30 DIAGNOSIS — N760 Acute vaginitis: Secondary | ICD-10-CM | POA: Diagnosis not present

## 2019-12-30 DIAGNOSIS — R102 Pelvic and perineal pain: Secondary | ICD-10-CM | POA: Diagnosis not present

## 2019-12-31 MED FILL — DOXEPIN 75 MG CAPSULE: 75 | 30 days supply | Qty: 60 | Fill #2

## 2019-12-31 MED FILL — ALPRAZolam 0.5 MG TABS: 0.5 | 30 days supply | Qty: 30 | Fill #1

## 2019-12-31 MED FILL — ZOLPIDEM TART ER 12.5 MG TA: 12.5 | 30 days supply | Qty: 30 | Fill #3

## 2020-01-04 DIAGNOSIS — M1711 Unilateral primary osteoarthritis, right knee: Secondary | ICD-10-CM | POA: Diagnosis not present

## 2020-01-04 MED FILL — METRONIDAZOLE 500 MG TABS: 500 | 7 days supply | Qty: 14 | Fill #0

## 2020-01-11 DIAGNOSIS — M1711 Unilateral primary osteoarthritis, right knee: Secondary | ICD-10-CM | POA: Diagnosis not present

## 2020-01-12 MED FILL — IBUPROFEN 800 MG TAB: 800 | 20 days supply | Qty: 60 | Fill #0

## 2020-01-13 MED FILL — AMPHETAMINE-DEXTROAMPHETAMI: 20 | 30 days supply | Qty: 30 | Fill #0

## 2020-01-13 MED FILL — VYVANSE 70 MG CAPSULE: 70 | 30 days supply | Qty: 30 | Fill #0

## 2020-01-13 MED FILL — NYSTATIN 100,000 UNITS/ML S: 100000 | 7 days supply | Qty: 140 | Fill #3

## 2020-01-18 DIAGNOSIS — M1711 Unilateral primary osteoarthritis, right knee: Secondary | ICD-10-CM | POA: Diagnosis not present

## 2020-01-18 MED FILL — GABAPENTIN 600 MG TABLET: 600 | 30 days supply | Qty: 90 | Fill #1

## 2020-01-18 MED FILL — traMADol HCL 50 MG TABS: 50 | 30 days supply | Qty: 90 | Fill #3

## 2020-01-18 MED FILL — FAMOTIDINE 20 MG TABLET: 20 | 30 days supply | Qty: 60 | Fill #0

## 2020-01-19 ENCOUNTER — Other Ambulatory Visit: Payer: Self-pay

## 2020-01-19 MED ORDER — CLINDAMYCIN PHOS-BENZOYL PEROX 1-5 % EX GEL: Freq: Every day | CUTANEOUS | 2 refills | Status: DC

## 2020-01-19 MED FILL — CLINDAMYCIN PHOS-BENZOYL PE: 1-5 | 20 days supply | Qty: 50 | Fill #0

## 2020-01-19 NOTE — Progress Notes (Signed)
Benzaclin RF to [facility].

## 2020-01-23 ENCOUNTER — Other Ambulatory Visit: Payer: Self-pay

## 2020-01-23 ENCOUNTER — Ambulatory Visit (INDEPENDENT_AMBULATORY_CARE_PROVIDER_SITE_OTHER): Payer: 59

## 2020-01-23 ENCOUNTER — Ambulatory Visit
Admission: EM | Admit: 2020-01-23 | Discharge: 2020-01-23 | Disposition: A | Discharge status: Home / Self Care | Payer: 59 | Attending: Family Medicine | Admitting: Family Medicine

## 2020-01-23 DIAGNOSIS — S6991XA Unspecified injury of right wrist, hand and finger(s), initial encounter: Secondary | ICD-10-CM | POA: Diagnosis not present

## 2020-01-23 DIAGNOSIS — S63501A Unspecified sprain of right wrist, initial encounter: Secondary | ICD-10-CM | POA: Diagnosis not present

## 2020-01-23 DIAGNOSIS — M25531 Pain in right wrist: Secondary | ICD-10-CM

## 2020-01-23 MED ORDER — ACETAMINOPHEN-CODEINE #3 300-30 MG PO TABS: 1.0000 | ORAL_TABLET | Freq: Four times a day (QID) | ORAL | 0 refills | Status: AC | PRN

## 2020-01-23 NOTE — ED Triage Notes (Signed)
Pt presents with right wrist injury from work last night
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
## 2020-01-23 NOTE — Discharge Instructions (Addendum)
R wrist xray negative  Prescribed tylenol #3 for pain  Take ibuprofen as needed.  Rest and elevate your wrist.  Apply ice packs 2-3 times a day for up to 20 minutes each.  Wear the brace as needed for comfort.    Follow up with your primary care provider or an orthopedist if you symptoms continue or worsen;  Or if you develop new symptoms, such as numbness, tingling, or weakness.

## 2020-01-24 MED FILL — ACETAMINOPHEN/COD #3 TABLET: 300-30 | 2 days supply | Qty: 15 | Fill #0

## 2020-01-26 DIAGNOSIS — Z23 Encounter for immunization: Secondary | ICD-10-CM | POA: Diagnosis not present

## 2020-01-27 MED FILL — ZOLPIDEM TART ER 12.5 MG TA: 12.5 | 30 days supply | Qty: 30 | Fill #0

## 2020-02-09 DIAGNOSIS — Z23 Encounter for immunization: Secondary | ICD-10-CM | POA: Diagnosis not present

## 2020-02-09 DIAGNOSIS — R102 Pelvic and perineal pain: Secondary | ICD-10-CM | POA: Diagnosis not present

## 2020-02-09 MED FILL — ALPRAZolam 0.5 MG TABS: 0.5 | 30 days supply | Qty: 30 | Fill #2

## 2020-02-10 ENCOUNTER — Other Ambulatory Visit: Payer: Self-pay

## 2020-02-10 ENCOUNTER — Ambulatory Visit (INDEPENDENT_AMBULATORY_CARE_PROVIDER_SITE_OTHER): Payer: 59 | Admitting: Dermatology

## 2020-02-10 DIAGNOSIS — L814 Other melanin hyperpigmentation: Secondary | ICD-10-CM

## 2020-02-10 DIAGNOSIS — L578 Other skin changes due to chronic exposure to nonionizing radiation: Secondary | ICD-10-CM

## 2020-02-10 DIAGNOSIS — L720 Epidermal cyst: Secondary | ICD-10-CM | POA: Diagnosis not present

## 2020-02-10 DIAGNOSIS — D485 Neoplasm of uncertain behavior of skin: Secondary | ICD-10-CM | POA: Diagnosis not present

## 2020-02-10 DIAGNOSIS — L821 Other seborrheic keratosis: Secondary | ICD-10-CM | POA: Diagnosis not present

## 2020-02-10 DIAGNOSIS — D229 Melanocytic nevi, unspecified: Secondary | ICD-10-CM | POA: Diagnosis not present

## 2020-02-10 DIAGNOSIS — D1800 Hemangioma unspecified site: Secondary | ICD-10-CM

## 2020-02-10 DIAGNOSIS — L709 Acne, unspecified: Secondary | ICD-10-CM | POA: Diagnosis not present

## 2020-02-10 DIAGNOSIS — D225 Melanocytic nevi of trunk: Secondary | ICD-10-CM | POA: Diagnosis not present

## 2020-02-10 DIAGNOSIS — D489 Neoplasm of uncertain behavior, unspecified: Secondary | ICD-10-CM

## 2020-02-10 DIAGNOSIS — Z86018 Personal history of other benign neoplasm: Secondary | ICD-10-CM

## 2020-02-10 DIAGNOSIS — Z1283 Encounter for screening for malignant neoplasm of skin: Secondary | ICD-10-CM

## 2020-02-10 NOTE — Progress Notes (Signed)
Follow-Up Visit   Subjective  Sonia Holden is a 34 y.o. female who presents for the following: TBSE.  Patient here for full body skin exam and skin cancer screening. She has an area of concern under her left eye, has been present for about 2 months. Patient has no h/o skin cancer. Patient does have a h/o Moderate Dysplastic nevus R. Superior helix 11/11/18. Patient states that she has some scarring on face from constant use of N95 masks. Patient works at a [employer].  The following portions of the chart were reviewed this encounter and updated as appropriate:  Tobacco  Allergies  Meds  Problems  Med Hx  Surg Hx  Fam Hx      Review of Systems:  No other skin or systemic complaints except as noted in HPI or Assessment and Plan.  Objective  Well appearing patient in no apparent distress; mood and affect are within normal limits.  A full examination was performed including scalp, head, eyes, ears, nose, lips, neck, chest, axillae, abdomen, back, buttocks, bilateral upper extremities, bilateral lower extremities, hands, feet, fingers, toes, fingernails, and toenails. All findings within normal limits unless otherwise noted below.  Objective  Back, chest, neck and face: Trace open comedones, few small inflammatory papules at lower face Scattered inflammatory papules at back, neck and chest  Objective  Left Axilla: Subcutaneous nodule.   Objective  Mid Chest: 0.45 cm  brown macule within scar  Objective  Left Breast @ 7 O'Clock: 0.1 cm brown macule   Assessment & Plan  Acne, unspecified acne type Back, chest, neck and face  Discussed options of tretinoin, aczone or benzyl peroxide-clindamycin.  Patient defers prescription treatment at this time. Recommend CLN sports wash  Epidermal cyst Left Axilla  Benign-appearing.  Observation.  Call clinic for new or changing lesions.  Recommend daily use of broad spectrum spf 30+ sunscreen to sun-exposed areas.     Discussed surgical excision in office if changes noted or symptomatic.   Neoplasm of uncertain behavior (2) Mid Chest  Epidermal / dermal shaving  Lesion diameter (cm):  0.5 Informed consent: discussed and consent obtained   Timeout: patient name, date of birth, surgical site, and procedure verified   Anesthesia: the lesion was anesthetized in a standard fashion   Anesthetic:  1% lidocaine w/ epinephrine 1-100,000 buffered w/ 8.4% NaHCO3 Instrument used: flexible razor blade   Outcome: patient tolerated procedure well   Post-procedure details: sterile dressing applied and wound care instructions given   Dressing type: petrolatum and pressure dressing    Specimen 1 - Surgical pathology Differential Diagnosis: R/O Atypia vs recurrent nevus Check Margins: No 0.45 cm  brown macule within scar  Left Breast @ 7 O'Clock  Shave removal  Mid chest  Favor sk over lentigo Left breast at 7 O'clock, benign apperaring, observe   Lentigines - Scattered tan macules - Discussed due to sun exposure - Benign, observe - Call for any changes  Seborrheic Keratoses - Stuck-on, waxy, tan-brown papules and plaques  - Discussed benign etiology and prognosis. - Observe - Call for any changes  Melanocytic Nevi - Tan-brown and/or pink-flesh-colored symmetric macules and papules - Benign appearing on exam today - Observation - Call clinic for new or changing moles - Recommend daily use of broad spectrum spf 30+ sunscreen to sun-exposed areas.   Hemangiomas - Red papules - Discussed benign nature - Observe - Call for any changes  Actinic Damage - diffuse scaly erythematous macules with underlying dyspigmentation -  Recommend daily broad spectrum sunscreen SPF 30+ to sun-exposed areas, reapply every 2 hours as needed.  - Call for new or changing lesions.  Skin cancer screening performed today.  History of Dysplastic Nevi R. Superior helix 11/11/18 - No evidence of recurrence today -  Recommend regular full body skin exams - Recommend daily broad spectrum sunscreen SPF 30+ to sun-exposed areas, reapply every 2 hours as needed.  - Call if any new or changing lesions are noted between office visits   Return in about 1 year (around 02/09/2021) for TBSE.   I, Donzetta Kohut, CMA, am acting as scribe for Forest Gleason, MD .  Documentation: I have reviewed the above documentation for accuracy and completeness, and I agree with the above.  Forest Gleason, MD

## 2020-02-10 NOTE — Patient Instructions (Addendum)
Recommend daily broad spectrum sunscreen SPF 30+ to sun-exposed areas, reapply every 2 hours as needed. Call for new or changing lesions. Wound Care Instructions  1. Cleanse wound gently with soap and water once a day then pat dry with clean gauze. Apply a thing coat of Petrolatum (petroleum jelly, "Vaseline") over the wound (unless you have an allergy to this). We recommend that you use a new, sterile tube of Vaseline. Do not pick or remove scabs. Do not remove the yellow or white "healing tissue" from the base of the wound.  2. Cover the wound with fresh, clean, nonstick gauze and secure with paper tape. You may use Band-Aids in place of gauze and tape if the would is small enough, but would recommend trimming much of the tape off as there is often too much. Sometimes Band-Aids can irritate the skin.  3. You should call the office for your biopsy report after 1 week if you have not already been contacted.  4. If you experience any problems, such as abnormal amounts of bleeding, swelling, significant bruising, significant pain, or evidence of infection, please call the office immediately.  5. FOR ADULT SURGERY PATIENTS: If you need something for pain relief you may take 1 extra strength Tylenol (acetaminophen) AND 2 Ibuprofen (200mg each) together every 4 hours as needed for pain. (do not take these if you are allergic to them or if you have a reason you should not take them.) Typically, you may only need pain medication for 1 to 3 days.     Prior to procedure, discussed risks of blister formation, small wound, skin dyspigmentation, or rare scar following cryotherapy.  Liquid nitrogen was applied for 10-12 seconds to the skin lesion and the expected blistering or scabbing reaction explained. Do not pick at the area. Patient reminded to expect hypopigmented scars from the procedure. Return if lesion fails to fully resolve.  Cryotherapy Aftercare  . Wash gently with soap and water everyday.    . Apply Vaseline and Band-Aid daily until healed.    

## 2020-02-16 DIAGNOSIS — M1711 Unilateral primary osteoarthritis, right knee: Secondary | ICD-10-CM | POA: Diagnosis not present

## 2020-02-16 MED FILL — AMPHETAMINE-DEXTROAMPHETAMI: 20 | 30 days supply | Qty: 30 | Fill #0

## 2020-02-16 MED FILL — VYVANSE 70 MG CAPSULE: 70 | 30 days supply | Qty: 30 | Fill #0

## 2020-02-16 MED FILL — traMADol HCL 50 MG TABS: 50 | 30 days supply | Qty: 90 | Fill #4

## 2020-02-17 NOTE — Progress Notes (Signed)
Skin , mid chest FINDINGS COMPATIBLE WITH RECURRENT NEVUS, PERIPHERAL MARGIN INVOLVED

## 2020-02-23 DIAGNOSIS — Z23 Encounter for immunization: Secondary | ICD-10-CM | POA: Diagnosis not present

## 2020-02-28 ENCOUNTER — Encounter: Payer: Self-pay | Admitting: Dermatology

## 2020-02-28 MED FILL — GABAPENTIN 600 MG TABLET: 600 | 30 days supply | Qty: 90 | Fill #2

## 2020-02-28 MED FILL — DOXEPIN HCL 75 MG CAPS: 75 | 30 days supply | Qty: 60 | Fill #3

## 2020-02-28 MED FILL — ZOLPIDEM TART ER 12.5 MG TA: 12.5 | 30 days supply | Qty: 30 | Fill #1

## 2020-03-14 ENCOUNTER — Other Ambulatory Visit (HOSPITAL_COMMUNITY): Payer: Self-pay | Admitting: Adult Health Nurse Practitioner

## 2020-03-16 MED FILL — VYVANSE 70 MG CAPSULE: 70 | 30 days supply | Qty: 30 | Fill #0

## 2020-03-16 MED FILL — AMPHETAMINE-DEXTROAMPHETAMI: 20 | 30 days supply | Qty: 30 | Fill #0

## 2020-03-17 MED FILL — traMADol HCL 50 MG TABS: 50 | 30 days supply | Qty: 90 | Fill #5

## 2020-03-19 ENCOUNTER — Other Ambulatory Visit (HOSPITAL_COMMUNITY): Payer: Self-pay

## 2020-03-20 MED FILL — predniSONE 20 MG TABS: 20 | 5 days supply | Qty: 5 | Fill #0

## 2020-03-20 MED FILL — valACYclovir HCL 1 GM TABS: 1 | 10 days supply | Qty: 30 | Fill #0

## 2020-03-22 ENCOUNTER — Encounter: Payer: Self-pay | Admitting: Internal Medicine

## 2020-03-22 ENCOUNTER — Telehealth (INDEPENDENT_AMBULATORY_CARE_PROVIDER_SITE_OTHER): Payer: 59 | Admitting: Internal Medicine

## 2020-03-22 ENCOUNTER — Other Ambulatory Visit: Payer: Self-pay

## 2020-03-22 VITALS — Ht 66.0 in | Wt 190.4 lb

## 2020-03-22 DIAGNOSIS — B09 Unspecified viral infection characterized by skin and mucous membrane lesions: Secondary | ICD-10-CM | POA: Diagnosis not present

## 2020-03-22 DIAGNOSIS — R509 Fever, unspecified: Secondary | ICD-10-CM

## 2020-03-22 LAB — POCT INFLUENZA A/B
Influenza A, POC: NEGATIVE
Influenza B, POC: NEGATIVE
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
## 2020-03-22 NOTE — Assessment & Plan Note (Addendum)
The rash is not behaving like shingles in that it is not painful and has now crossed the midline .  Suspect influenza or other NON COVID viral exanthem. (she has been negative x 2 )  .  Testing for influenza today was negative . Needs to remain out of work until she is afebrile x 24 hours.

## 2020-03-23 ENCOUNTER — Other Ambulatory Visit: Payer: Self-pay | Admitting: Internal Medicine

## 2020-03-23 ENCOUNTER — Other Ambulatory Visit: Payer: Self-pay

## 2020-03-23 MED ORDER — PREDNISONE 10 MG PO TABS: ORAL_TABLET | ORAL | 0 refills | Status: DC

## 2020-03-24 MED FILL — predniSONE 10 MG TABS: 10 | 6 days supply | Qty: 21 | Fill #0

## 2020-03-24 NOTE — Telephone Encounter (Signed)
Last OV 10/06/19 and last refill 10/06/19

## 2020-03-25 ENCOUNTER — Other Ambulatory Visit: Payer: Self-pay | Admitting: Internal Medicine

## 2020-03-25 MED ORDER — NYSTATIN 100000 UNIT/ML MT SUSP: 5.0000 mL | Freq: Four times a day (QID) | OROMUCOSAL | 0 refills | Status: DC

## 2020-03-25 MED FILL — NYSTATIN 100,000 UNITS/ML S: 100000 | 3 days supply | Qty: 60 | Fill #0

## 2020-03-25 NOTE — Telephone Encounter (Signed)
Nystatin oral suspension sent to [facility]

## 2020-03-27 MED FILL — ZOLPIDEM TART ER 12.5 MG TA: 12.5 | 30 days supply | Qty: 30 | Fill #2

## 2020-03-30 MED FILL — ALPRAZolam 0.5 MG TABS: 0.5 | 30 days supply | Qty: 30 | Fill #0

## 2020-04-18 NOTE — Telephone Encounter (Signed)
MyChart message sent :  I received an FMLA form with no accompanying letter from patient explaining why you are  requesting FMLA .    Can you provide information regarding your request> (explain why ,  When and for how long you are requesting) .  I may require you to schedule an office visit based on your reply.

## 2020-04-19 ENCOUNTER — Other Ambulatory Visit: Payer: Self-pay | Admitting: Dermatology

## 2020-04-20 ENCOUNTER — Other Ambulatory Visit: Payer: Self-pay | Admitting: Internal Medicine

## 2020-04-20 ENCOUNTER — Other Ambulatory Visit: Payer: Self-pay | Admitting: Dermatology

## 2020-04-20 DIAGNOSIS — Z760 Encounter for issue of repeat prescription: Secondary | ICD-10-CM

## 2020-04-20 MED ORDER — VALACYCLOVIR HCL 1 G PO TABS: 1.0000 g | ORAL_TABLET | ORAL | 0 refills | Status: DC

## 2020-04-20 MED FILL — DOXEPIN HCL 75 MG CAPS: 75 | 30 days supply | Qty: 60 | Fill #4

## 2020-04-20 MED FILL — VYVANSE 70 MG CAPSULE: 70 | 30 days supply | Qty: 30 | Fill #0

## 2020-04-20 MED FILL — AMPHETAMINE-DEXTROAMPHETAMI: 20 | 30 days supply | Qty: 30 | Fill #0

## 2020-04-20 MED FILL — valACYclovir HCL 1 GM TABS: 1 | 7 days supply | Qty: 30 | Fill #0

## 2020-04-20 MED FILL — GABAPENTIN 600 MG TABLET: 600 | 30 days supply | Qty: 90 | Fill #3

## 2020-04-20 NOTE — Telephone Encounter (Signed)
LS:03-22-20 LO: 10-06-19

## 2020-04-21 ENCOUNTER — Other Ambulatory Visit: Payer: Self-pay | Admitting: Internal Medicine

## 2020-04-21 MED FILL — traMADol HCL 50 MG TABS: 50 | 30 days supply | Qty: 90 | Fill #0

## 2020-04-26 MED FILL — ZOLPIDEM TART ER 12.5 MG TA: 12.5 | 30 days supply | Qty: 30 | Fill #3

## 2020-05-03 DIAGNOSIS — F4000 Agoraphobia, unspecified: Secondary | ICD-10-CM | POA: Diagnosis not present

## 2020-05-03 DIAGNOSIS — F919 Conduct disorder, unspecified: Secondary | ICD-10-CM | POA: Diagnosis not present

## 2020-05-03 DIAGNOSIS — F329 Major depressive disorder, single episode, unspecified: Secondary | ICD-10-CM | POA: Diagnosis not present

## 2020-05-05 ENCOUNTER — Other Ambulatory Visit: Payer: Self-pay | Admitting: Obstetrics and Gynecology

## 2020-05-05 DIAGNOSIS — Z803 Family history of malignant neoplasm of breast: Secondary | ICD-10-CM

## 2020-05-09 ENCOUNTER — Other Ambulatory Visit (HOSPITAL_COMMUNITY): Payer: Self-pay | Admitting: Adult Health Nurse Practitioner

## 2020-05-09 MED FILL — ALPRAZolam 0.5 MG TABS: 0.5 | 30 days supply | Qty: 30 | Fill #1

## 2020-05-12 ENCOUNTER — Ambulatory Visit
Admission: RE | Admit: 2020-05-12 | Discharge: 2020-05-12 | Disposition: A | Discharge status: Home / Self Care | Payer: 59 | Source: Ambulatory Visit | Attending: Obstetrics and Gynecology | Admitting: Obstetrics and Gynecology

## 2020-05-12 ENCOUNTER — Other Ambulatory Visit: Payer: Self-pay

## 2020-05-12 DIAGNOSIS — Z803 Family history of malignant neoplasm of breast: Secondary | ICD-10-CM

## 2020-05-12 DIAGNOSIS — N6489 Other specified disorders of breast: Secondary | ICD-10-CM | POA: Diagnosis not present

## 2020-05-12 MED ORDER — GADOBUTROL 1 MMOL/ML IV SOLN
9.0000 mL | Freq: Once | INTRAVENOUS | Status: AC | PRN
  Administered 2020-05-12: 9 mL via INTRAVENOUS

## 2020-05-19 ENCOUNTER — Other Ambulatory Visit: Payer: Self-pay | Admitting: Internal Medicine

## 2020-05-19 MED FILL — traMADol HCL 50 MG TABS: 50 | 30 days supply | Qty: 90 | Fill #1

## 2020-05-19 MED FILL — CYCLOBENZAPRINE HCL 10 MG T: 10 | 10 days supply | Qty: 30 | Fill #0

## 2020-05-19 MED FILL — GABAPENTIN 600 MG TABLET: 600 | 30 days supply | Qty: 90 | Fill #0

## 2020-05-20 MED FILL — VYVANSE 70 MG CAPSULE: 70 | 30 days supply | Qty: 30 | Fill #0

## 2020-05-20 MED FILL — AMPHETAMINE-DEXTROAMPHETAMI: 20 | 30 days supply | Qty: 30 | Fill #0

## 2020-05-23 MED FILL — ZOLPIDEM TART ER 12.5 MG TA: 12.5 | 30 days supply | Qty: 30 | Fill #0

## 2020-06-06 ENCOUNTER — Encounter: Payer: Self-pay | Admitting: Internal Medicine

## 2020-06-06 ENCOUNTER — Telehealth (INDEPENDENT_AMBULATORY_CARE_PROVIDER_SITE_OTHER): Payer: 59 | Admitting: Internal Medicine

## 2020-06-06 ENCOUNTER — Other Ambulatory Visit: Payer: Self-pay | Admitting: Internal Medicine

## 2020-06-06 DIAGNOSIS — U071 COVID-19: Secondary | ICD-10-CM | POA: Diagnosis not present

## 2020-06-06 DIAGNOSIS — F902 Attention-deficit hyperactivity disorder, combined type: Secondary | ICD-10-CM | POA: Diagnosis not present

## 2020-06-06 MED ORDER — HYDROCOD POLST-CPM POLST ER 10-8 MG/5ML PO SUER: 5.0000 mL | Freq: Every evening | ORAL | 0 refills | Status: DC | PRN

## 2020-06-06 MED ORDER — LEVALBUTEROL TARTRATE 45 MCG/ACT IN AERO: 1.0000 | INHALATION_SPRAY | Freq: Four times a day (QID) | RESPIRATORY_TRACT | 0 refills | Status: DC | PRN

## 2020-06-06 MED ORDER — PREDNISONE 10 MG PO TABS: ORAL_TABLET | ORAL | 0 refills | Status: DC

## 2020-06-06 MED FILL — predniSONE 10 MG TABS: 10 | 18 days supply | Qty: 33 | Fill #0

## 2020-06-06 MED FILL — LEVALBUTEROL TAR HFA 45MCG: 45 | 25 days supply | Qty: 15 | Fill #0

## 2020-06-06 MED FILL — HYDROCODONE-CHLORPHEN ER SU: 10-8 | 28 days supply | Qty: 140 | Fill #0
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
## 2020-06-06 NOTE — Patient Instructions (Addendum)
Prednisone taper,  Tussionex for cough,  And xopenex for the chest tightness  Set to [facility]  Follow up on Thursday for symptoms update   The phenylephrine Arlyce Harman combo is accelerating your heart rate.  Stop one or the other.   Referral for the MoAb infusion in progress

## 2020-06-07 DIAGNOSIS — U071 COVID-19: Secondary | ICD-10-CM | POA: Insufficient documentation

## 2020-06-07 DIAGNOSIS — Z8616 Personal history of COVID-19: Secondary | ICD-10-CM | POA: Insufficient documentation

## 2020-06-07 NOTE — Assessment & Plan Note (Signed)
Patient has a mild COVID 19 infection .  Supportive care outlined and prescribed.  Xopenex MDI , prednisone and Tussionex prescribe.

## 2020-06-07 NOTE — Assessment & Plan Note (Signed)
Advised to suspend Vyvanse during acute illness.

## 2020-06-15 ENCOUNTER — Other Ambulatory Visit (HOSPITAL_COMMUNITY): Payer: Self-pay | Admitting: Adult Health Nurse Practitioner

## 2020-06-22 ENCOUNTER — Other Ambulatory Visit: Payer: Self-pay | Admitting: Internal Medicine

## 2020-06-22 ENCOUNTER — Other Ambulatory Visit: Payer: Self-pay | Admitting: Dermatology

## 2020-06-22 ENCOUNTER — Other Ambulatory Visit (HOSPITAL_COMMUNITY): Payer: Self-pay | Admitting: Obstetrics and Gynecology

## 2020-06-22 DIAGNOSIS — Z760 Encounter for issue of repeat prescription: Secondary | ICD-10-CM

## 2020-06-22 MED FILL — ALPRAZolam 0.5 MG TABS: 0.5 | 30 days supply | Qty: 30 | Fill #2

## 2020-06-22 MED FILL — ZOLPIDEM TART ER 12.5 MG TA: 12.5 | 30 days supply | Qty: 30 | Fill #1

## 2020-06-22 MED FILL — traMADol HCL 50 MG TABS: 50 | 30 days supply | Qty: 90 | Fill #0

## 2020-06-22 MED FILL — VYVANSE 70 MG CAPSULE: 70 | 30 days supply | Qty: 30 | Fill #0

## 2020-06-22 MED FILL — IBUPROFEN 800 MG TAB: 800 | 30 days supply | Qty: 90 | Fill #0

## 2020-06-22 MED FILL — FAMOTIDINE 20 MG TABLET: 20 | 30 days supply | Qty: 60 | Fill #0

## 2020-06-22 MED FILL — AMPHETAMINE-DEXTROAMPHETAMI: 20 | 30 days supply | Qty: 30 | Fill #0

## 2020-06-22 MED FILL — CYCLOBENZAPRINE HCL 10 MG T: 10 | 10 days supply | Qty: 30 | Fill #1

## 2020-06-22 MED FILL — valACYclovir HCL 1 GM TABS: 1 | 7 days supply | Qty: 30 | Fill #0

## 2020-06-22 NOTE — Telephone Encounter (Signed)
RX Refill:tramadol Last Seen:06-07-19 Last ordered:04-21-20

## 2020-06-22 NOTE — Telephone Encounter (Signed)
REfilled

## 2020-06-23 DIAGNOSIS — M1711 Unilateral primary osteoarthritis, right knee: Secondary | ICD-10-CM | POA: Diagnosis not present

## 2020-06-27 ENCOUNTER — Other Ambulatory Visit (HOSPITAL_COMMUNITY): Payer: Self-pay | Admitting: Adult Health Nurse Practitioner

## 2020-06-28 MED FILL — DOXEPIN HCL 75 MG CAPS: 75 | 30 days supply | Qty: 60 | Fill #0
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
## 2020-07-24 MED FILL — AMPHETAMINE-DEXTROAMPHETAMI: 20 | 30 days supply | Qty: 30 | Fill #0

## 2020-07-24 MED FILL — ZOLPIDEM TART ER 12.5 MG TA: 12.5 | 30 days supply | Qty: 30 | Fill #2

## 2020-07-24 MED FILL — VYVANSE 70 MG CAPSULE: 70 | 30 days supply | Qty: 30 | Fill #0

## 2020-07-24 MED FILL — traMADol HCL 50 MG TABS: 50 | 30 days supply | Qty: 90 | Fill #1

## 2020-07-27 ENCOUNTER — Ambulatory Visit: Payer: 59 | Admitting: Rheumatology

## 2020-07-27 ENCOUNTER — Other Ambulatory Visit (HOSPITAL_COMMUNITY): Payer: Self-pay | Admitting: Adult Health Nurse Practitioner

## 2020-07-27 DIAGNOSIS — Z803 Family history of malignant neoplasm of breast: Secondary | ICD-10-CM

## 2020-07-27 DIAGNOSIS — M1711 Unilateral primary osteoarthritis, right knee: Secondary | ICD-10-CM | POA: Diagnosis not present

## 2020-07-27 DIAGNOSIS — E781 Pure hyperglyceridemia: Secondary | ICD-10-CM

## 2020-07-27 DIAGNOSIS — R768 Other specified abnormal immunological findings in serum: Secondary | ICD-10-CM

## 2020-07-27 DIAGNOSIS — R5383 Other fatigue: Secondary | ICD-10-CM

## 2020-07-27 DIAGNOSIS — G5601 Carpal tunnel syndrome, right upper limb: Secondary | ICD-10-CM | POA: Diagnosis not present

## 2020-07-27 DIAGNOSIS — S83511S Sprain of anterior cruciate ligament of right knee, sequela: Secondary | ICD-10-CM

## 2020-07-27 DIAGNOSIS — G8929 Other chronic pain: Secondary | ICD-10-CM

## 2020-07-27 DIAGNOSIS — M545 Low back pain, unspecified: Secondary | ICD-10-CM

## 2020-07-27 DIAGNOSIS — F419 Anxiety disorder, unspecified: Secondary | ICD-10-CM

## 2020-07-27 MED FILL — ALPRAZolam 0.5 MG TABS: 0.5 | 30 days supply | Qty: 30 | Fill #0

## 2020-08-03 DIAGNOSIS — M1711 Unilateral primary osteoarthritis, right knee: Secondary | ICD-10-CM | POA: Diagnosis not present

## 2020-08-03 MED FILL — ALPRAZolam 0.5 MG TABS: 0.5 | 30 days supply | Qty: 30 | Fill #0

## 2020-08-03 MED FILL — traMADol HCL 50 MG TABS: 50 | 30 days supply | Qty: 90 | Fill #1

## 2020-08-08 MED FILL — DOXEPIN HCL 75 MG CAPS: 75 | 30 days supply | Qty: 60 | Fill #0

## 2020-08-10 DIAGNOSIS — M1711 Unilateral primary osteoarthritis, right knee: Secondary | ICD-10-CM | POA: Diagnosis not present

## 2020-08-10 DIAGNOSIS — M25512 Pain in left shoulder: Secondary | ICD-10-CM | POA: Diagnosis not present

## 2020-08-17 ENCOUNTER — Other Ambulatory Visit (HOSPITAL_COMMUNITY): Payer: Self-pay | Admitting: Adult Health Nurse Practitioner

## 2020-08-23 MED FILL — AMPHETAMINE-DEXTROAMPHETAMI: 20 | 30 days supply | Qty: 30 | Fill #0

## 2020-08-23 MED FILL — CLINDAMYCIN PHOS-BENZOYL PE: 1-5 | 20 days supply | Qty: 50 | Fill #1

## 2020-08-23 MED FILL — VYVANSE 70 MG CAPSULE: 70 | 30 days supply | Qty: 30 | Fill #0

## 2020-08-23 MED FILL — ZOLPIDEM TART ER 12.5 MG TA: 12.5 | 30 days supply | Qty: 30 | Fill #3

## 2020-08-23 MED FILL — IBUPROFEN 800 MG TAB: 800 | 30 days supply | Qty: 90 | Fill #1

## 2020-08-23 MED FILL — GABAPENTIN 600 MG TABLET: 600 | 30 days supply | Qty: 90 | Fill #2

## 2020-08-25 ENCOUNTER — Other Ambulatory Visit (HOSPITAL_BASED_OUTPATIENT_CLINIC_OR_DEPARTMENT_OTHER): Payer: Self-pay

## 2020-09-04 ENCOUNTER — Ambulatory Visit (HOSPITAL_COMMUNITY)
Admission: RE | Admit: 2020-09-04 | Discharge: 2020-09-04 | Disposition: A | Discharge status: Home / Self Care | Payer: 59 | Source: Ambulatory Visit | Attending: Family Medicine | Admitting: Family Medicine

## 2020-09-04 ENCOUNTER — Encounter (HOSPITAL_COMMUNITY): Payer: Self-pay

## 2020-09-04 ENCOUNTER — Other Ambulatory Visit: Payer: Self-pay

## 2020-09-04 ENCOUNTER — Ambulatory Visit (HOSPITAL_COMMUNITY)
Admission: EM | Admit: 2020-09-04 | Discharge: 2020-09-04 | Disposition: A | Discharge status: Home / Self Care | Payer: 59 | Attending: Family Medicine | Admitting: Family Medicine

## 2020-09-04 DIAGNOSIS — R200 Anesthesia of skin: Secondary | ICD-10-CM | POA: Diagnosis not present

## 2020-09-04 DIAGNOSIS — R600 Localized edema: Secondary | ICD-10-CM

## 2020-09-04 DIAGNOSIS — M79604 Pain in right leg: Secondary | ICD-10-CM | POA: Diagnosis not present

## 2020-09-04 DIAGNOSIS — R609 Edema, unspecified: Secondary | ICD-10-CM | POA: Diagnosis not present

## 2020-09-04 DIAGNOSIS — R202 Paresthesia of skin: Secondary | ICD-10-CM | POA: Insufficient documentation

## 2020-09-04 DIAGNOSIS — M7989 Other specified soft tissue disorders: Secondary | ICD-10-CM | POA: Insufficient documentation

## 2020-09-04 NOTE — ED Notes (Signed)
Called for appt at [facility], [place], [name], today at 75, for pt for ultrasound for ruled out DVT, pt verbalized understanding and agreed.

## 2020-09-04 NOTE — ED Triage Notes (Signed)
Pt c/o numbness on right side of leg x 1 week. She states she feels a burning sensation on the side of her calf.
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
## 2020-09-04 NOTE — Discharge Instructions (Signed)
Go to [place], [name] at 2:00 pm for your ultrasound appointment today

## 2020-09-06 ENCOUNTER — Other Ambulatory Visit (HOSPITAL_COMMUNITY): Payer: Self-pay

## 2020-09-06 MED FILL — Tramadol HCl Tab 50 MG: ORAL | 30 days supply | Qty: 90 | Fill #0 | Status: AC

## 2020-09-12 ENCOUNTER — Other Ambulatory Visit (HOSPITAL_COMMUNITY): Payer: Self-pay

## 2020-09-12 MED FILL — Clindamycin Phosphate-Benzoyl Peroxide Gel 1-5%: CUTANEOUS | Qty: 50 | Fill #0 | Status: CN

## 2020-09-12 MED FILL — Alprazolam Tab 0.5 MG: ORAL | 30 days supply | Qty: 30 | Fill #0 | Status: AC

## 2020-09-15 ENCOUNTER — Other Ambulatory Visit (HOSPITAL_COMMUNITY): Payer: Self-pay

## 2020-09-25 ENCOUNTER — Other Ambulatory Visit (HOSPITAL_COMMUNITY): Payer: Self-pay

## 2020-09-25 ENCOUNTER — Other Ambulatory Visit: Payer: Self-pay

## 2020-09-25 MED ORDER — TRETINOIN MICROSPHERE 0.1 % EX GEL
Freq: Every day | CUTANEOUS | 0 refills | Status: DC
  Filled 2020-09-25: qty 45, 30d supply, fill #0

## 2020-09-25 NOTE — Progress Notes (Signed)
Tretinoin RF Request

## 2020-09-26 ENCOUNTER — Other Ambulatory Visit (HOSPITAL_COMMUNITY): Payer: Self-pay

## 2020-09-26 ENCOUNTER — Other Ambulatory Visit: Payer: Self-pay

## 2020-09-26 DIAGNOSIS — M25561 Pain in right knee: Secondary | ICD-10-CM | POA: Diagnosis not present

## 2020-09-26 DIAGNOSIS — M79671 Pain in right foot: Secondary | ICD-10-CM | POA: Diagnosis not present

## 2020-09-26 MED ORDER — LISDEXAMFETAMINE DIMESYLATE 70 MG PO CAPS
ORAL_CAPSULE | ORAL | 0 refills | Status: DC
  Filled 2020-09-26: qty 30, 30d supply, fill #0

## 2020-09-26 MED ORDER — AMPHETAMINE-DEXTROAMPHETAMINE 20 MG PO TABS
ORAL_TABLET | ORAL | 0 refills | Status: DC
  Filled 2020-09-26: qty 30, 30d supply, fill #0

## 2020-09-27 ENCOUNTER — Other Ambulatory Visit (HOSPITAL_COMMUNITY): Payer: Self-pay

## 2020-09-28 ENCOUNTER — Other Ambulatory Visit (HOSPITAL_COMMUNITY): Payer: Self-pay

## 2020-09-28 ENCOUNTER — Other Ambulatory Visit: Payer: Self-pay

## 2020-09-28 MED ORDER — ZOLPIDEM TARTRATE ER 12.5 MG PO TBCR
12.5000 mg | EXTENDED_RELEASE_TABLET | Freq: Every day | ORAL | 3 refills | Status: DC
  Filled 2020-09-28: qty 30, 30d supply, fill #0
  Filled 2020-10-26: qty 30, 30d supply, fill #1
  Filled 2020-11-27: qty 30, 30d supply, fill #2
  Filled 2020-12-27: qty 30, 30d supply, fill #3

## 2020-09-30 ENCOUNTER — Other Ambulatory Visit (HOSPITAL_COMMUNITY): Payer: Self-pay

## 2020-10-11 DIAGNOSIS — G5601 Carpal tunnel syndrome, right upper limb: Secondary | ICD-10-CM | POA: Diagnosis not present

## 2020-10-13 ENCOUNTER — Other Ambulatory Visit (HOSPITAL_COMMUNITY): Payer: Self-pay

## 2020-10-13 MED FILL — Tramadol HCl Tab 50 MG: ORAL | 30 days supply | Qty: 90 | Fill #1 | Status: AC

## 2020-10-13 MED FILL — Alprazolam Tab 0.5 MG: ORAL | 30 days supply | Qty: 30 | Fill #1 | Status: AC

## 2020-10-13 MED FILL — Doxepin HCl Cap 75 MG: ORAL | 30 days supply | Qty: 60 | Fill #0 | Status: AC

## 2020-10-16 ENCOUNTER — Other Ambulatory Visit (HOSPITAL_COMMUNITY): Payer: Self-pay

## 2020-10-19 DIAGNOSIS — F329 Major depressive disorder, single episode, unspecified: Secondary | ICD-10-CM | POA: Diagnosis not present

## 2020-10-19 DIAGNOSIS — F4000 Agoraphobia, unspecified: Secondary | ICD-10-CM | POA: Diagnosis not present

## 2020-10-19 DIAGNOSIS — F919 Conduct disorder, unspecified: Secondary | ICD-10-CM | POA: Diagnosis not present

## 2020-10-25 ENCOUNTER — Other Ambulatory Visit: Payer: Self-pay | Admitting: Orthopedic Surgery

## 2020-10-25 ENCOUNTER — Other Ambulatory Visit: Payer: Self-pay

## 2020-10-25 ENCOUNTER — Other Ambulatory Visit (HOSPITAL_COMMUNITY): Payer: Self-pay

## 2020-10-25 DIAGNOSIS — G5601 Carpal tunnel syndrome, right upper limb: Secondary | ICD-10-CM | POA: Diagnosis not present

## 2020-10-25 MED ORDER — GABAPENTIN 600 MG PO TABS
ORAL_TABLET | Freq: Three times a day (TID) | ORAL | 3 refills | Status: DC
  Filled 2020-10-25: qty 90, 30d supply, fill #0
  Filled 2020-12-14: qty 90, 30d supply, fill #1
  Filled 2021-01-15: qty 90, 30d supply, fill #2
  Filled 2021-02-20: qty 90, 30d supply, fill #3

## 2020-10-25 MED ORDER — AMPHETAMINE-DEXTROAMPHETAMINE 20 MG PO TABS
20.0000 mg | ORAL_TABLET | Freq: Every day | ORAL | 0 refills | Status: DC
  Filled 2020-10-25: qty 30, 30d supply, fill #0

## 2020-10-25 MED ORDER — LISDEXAMFETAMINE DIMESYLATE 70 MG PO CAPS
70.0000 mg | ORAL_CAPSULE | Freq: Every morning | ORAL | 0 refills | Status: DC
  Filled 2020-10-25: qty 30, 30d supply, fill #0

## 2020-10-26 ENCOUNTER — Other Ambulatory Visit (HOSPITAL_COMMUNITY): Payer: Self-pay

## 2020-11-02 DIAGNOSIS — G5601 Carpal tunnel syndrome, right upper limb: Secondary | ICD-10-CM | POA: Diagnosis not present

## 2020-11-02 DIAGNOSIS — M79641 Pain in right hand: Secondary | ICD-10-CM | POA: Diagnosis not present

## 2020-11-02 DIAGNOSIS — R200 Anesthesia of skin: Secondary | ICD-10-CM | POA: Diagnosis not present

## 2020-11-14 ENCOUNTER — Other Ambulatory Visit (HOSPITAL_COMMUNITY): Payer: Self-pay

## 2020-11-14 MED FILL — Tramadol HCl Tab 50 MG: ORAL | 30 days supply | Qty: 90 | Fill #2 | Status: AC

## 2020-11-15 ENCOUNTER — Other Ambulatory Visit (HOSPITAL_COMMUNITY): Payer: Self-pay

## 2020-11-15 MED ORDER — ALPRAZOLAM 0.5 MG PO TABS
ORAL_TABLET | ORAL | 2 refills | Status: DC
  Filled 2020-11-15: qty 30, 30d supply, fill #0
  Filled 2020-12-15: qty 30, 30d supply, fill #1
  Filled 2021-01-15: qty 30, 30d supply, fill #2

## 2020-11-21 ENCOUNTER — Other Ambulatory Visit (HOSPITAL_COMMUNITY): Payer: Self-pay

## 2020-11-27 ENCOUNTER — Other Ambulatory Visit (HOSPITAL_COMMUNITY): Payer: Self-pay

## 2020-11-27 MED ORDER — AMPHETAMINE-DEXTROAMPHETAMINE 20 MG PO TABS
20.0000 mg | ORAL_TABLET | Freq: Every day | ORAL | 0 refills | Status: DC
  Filled 2020-11-27: qty 30, 30d supply, fill #0

## 2020-11-27 MED ORDER — LISDEXAMFETAMINE DIMESYLATE 70 MG PO CAPS
70.0000 mg | ORAL_CAPSULE | Freq: Every morning | ORAL | 0 refills | Status: DC
  Filled 2020-11-27: qty 30, 30d supply, fill #0

## 2020-11-27 MED FILL — Doxepin HCl Cap 75 MG: ORAL | 30 days supply | Qty: 60 | Fill #1 | Status: AC

## 2020-11-28 ENCOUNTER — Other Ambulatory Visit (HOSPITAL_COMMUNITY): Payer: Self-pay

## 2020-12-14 ENCOUNTER — Other Ambulatory Visit (HOSPITAL_COMMUNITY): Payer: Self-pay

## 2020-12-14 MED FILL — Tramadol HCl Tab 50 MG: ORAL | 30 days supply | Qty: 90 | Fill #3 | Status: AC

## 2020-12-15 ENCOUNTER — Other Ambulatory Visit (HOSPITAL_COMMUNITY): Payer: Self-pay

## 2020-12-27 ENCOUNTER — Other Ambulatory Visit (HOSPITAL_COMMUNITY): Payer: Self-pay

## 2020-12-27 MED ORDER — AMPHETAMINE-DEXTROAMPHETAMINE 20 MG PO TABS
20.0000 mg | ORAL_TABLET | ORAL | 0 refills | Status: DC
  Filled 2020-12-27: qty 30, 30d supply, fill #0

## 2020-12-27 MED ORDER — LISDEXAMFETAMINE DIMESYLATE 70 MG PO CAPS
70.0000 mg | ORAL_CAPSULE | Freq: Every day | ORAL | 0 refills | Status: DC
  Filled 2020-12-27: qty 30, 30d supply, fill #0

## 2021-01-15 ENCOUNTER — Other Ambulatory Visit (HOSPITAL_COMMUNITY): Payer: Self-pay

## 2021-01-15 ENCOUNTER — Other Ambulatory Visit: Payer: Self-pay

## 2021-01-15 ENCOUNTER — Other Ambulatory Visit: Payer: Self-pay | Admitting: Dermatology

## 2021-01-15 ENCOUNTER — Telehealth: Payer: Self-pay | Admitting: Internal Medicine

## 2021-01-15 DIAGNOSIS — Z760 Encounter for issue of repeat prescription: Secondary | ICD-10-CM

## 2021-01-15 MED ORDER — VALACYCLOVIR HCL 1 G PO TABS
ORAL_TABLET | ORAL | 0 refills | Status: DC
  Filled 2021-01-15: qty 30, 7d supply, fill #0

## 2021-01-15 NOTE — Telephone Encounter (Signed)
Not in current medication list.  Refilled: 06/22/2020 Last OV: 10/06/2019 Next OV: not scheduled

## 2021-01-16 ENCOUNTER — Other Ambulatory Visit (HOSPITAL_COMMUNITY): Payer: Self-pay

## 2021-01-16 MED ORDER — FAMOTIDINE 20 MG PO TABS
20.0000 mg | ORAL_TABLET | Freq: Two times a day (BID) | ORAL | 0 refills | Status: DC
  Filled 2021-01-16: qty 60, 30d supply, fill #0

## 2021-01-16 NOTE — Telephone Encounter (Signed)
Denied. MyChart message sent

## 2021-01-16 NOTE — Telephone Encounter (Signed)
PT called to schedule a apt for Sept 23 at 3:30pm. PT stated this was for her Tramadol and was wanting to know if Dr.Tullo could fill it since she has the apt schedule now.

## 2021-01-17 ENCOUNTER — Other Ambulatory Visit (HOSPITAL_COMMUNITY): Payer: Self-pay

## 2021-01-17 NOTE — Telephone Encounter (Signed)
Made pt aware by sending a mychart message

## 2021-01-18 ENCOUNTER — Encounter: Payer: Self-pay | Admitting: Internal Medicine

## 2021-01-18 ENCOUNTER — Other Ambulatory Visit: Payer: Self-pay

## 2021-01-18 ENCOUNTER — Other Ambulatory Visit (HOSPITAL_COMMUNITY): Payer: Self-pay

## 2021-01-18 ENCOUNTER — Telehealth (INDEPENDENT_AMBULATORY_CARE_PROVIDER_SITE_OTHER): Payer: 59 | Admitting: Internal Medicine

## 2021-01-18 VITALS — Ht 67.0 in | Wt 200.0 lb

## 2021-01-18 DIAGNOSIS — E781 Pure hyperglyceridemia: Secondary | ICD-10-CM

## 2021-01-18 DIAGNOSIS — E559 Vitamin D deficiency, unspecified: Secondary | ICD-10-CM

## 2021-01-18 DIAGNOSIS — E6609 Other obesity due to excess calories: Secondary | ICD-10-CM | POA: Diagnosis not present

## 2021-01-18 DIAGNOSIS — M545 Low back pain, unspecified: Secondary | ICD-10-CM | POA: Diagnosis not present

## 2021-01-18 DIAGNOSIS — Z6833 Body mass index (BMI) 33.0-33.9, adult: Secondary | ICD-10-CM | POA: Diagnosis not present

## 2021-01-18 DIAGNOSIS — R5383 Other fatigue: Secondary | ICD-10-CM | POA: Diagnosis not present

## 2021-01-18 MED ORDER — TRAMADOL HCL 50 MG PO TABS
50.0000 mg | ORAL_TABLET | Freq: Three times a day (TID) | ORAL | 5 refills | Status: DC | PRN
  Filled 2021-01-18: qty 90, 30d supply, fill #0
  Filled 2021-02-20: qty 90, 30d supply, fill #1
  Filled 2021-03-29: qty 90, 30d supply, fill #2
  Filled 2021-04-30: qty 90, 30d supply, fill #3
  Filled 2021-05-29: qty 90, 30d supply, fill #4
  Filled 2021-07-10: qty 90, 30d supply, fill #5
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
## 2021-01-18 NOTE — Assessment & Plan Note (Addendum)
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  HAS BEEN TAKING GABAPENTIN 600 MG TID and  and tramadol for pain relief,  Refills given for 6 months .  encouraged continued exercise to strengthen core muscles.

## 2021-01-22 ENCOUNTER — Other Ambulatory Visit (HOSPITAL_COMMUNITY): Payer: Self-pay

## 2021-01-23 ENCOUNTER — Other Ambulatory Visit (HOSPITAL_COMMUNITY): Payer: Self-pay

## 2021-01-25 ENCOUNTER — Other Ambulatory Visit (HOSPITAL_COMMUNITY): Payer: Self-pay

## 2021-01-25 DIAGNOSIS — S83511D Sprain of anterior cruciate ligament of right knee, subsequent encounter: Secondary | ICD-10-CM | POA: Diagnosis not present

## 2021-01-25 DIAGNOSIS — M170 Bilateral primary osteoarthritis of knee: Secondary | ICD-10-CM | POA: Diagnosis not present

## 2021-01-25 MED ORDER — ZOLPIDEM TARTRATE ER 12.5 MG PO TBCR
EXTENDED_RELEASE_TABLET | ORAL | 2 refills | Status: DC
  Filled 2021-01-25: qty 30, 30d supply, fill #0
  Filled 2021-02-26: qty 30, 30d supply, fill #1
  Filled 2021-03-29: qty 30, 30d supply, fill #2

## 2021-01-26 ENCOUNTER — Other Ambulatory Visit (HOSPITAL_COMMUNITY): Payer: Self-pay

## 2021-01-26 MED ORDER — LISDEXAMFETAMINE DIMESYLATE 70 MG PO CAPS
70.0000 mg | ORAL_CAPSULE | Freq: Every morning | ORAL | 0 refills | Status: DC
  Filled 2021-01-26: qty 30, 30d supply, fill #0

## 2021-01-26 MED ORDER — AMPHETAMINE-DEXTROAMPHETAMINE 20 MG PO TABS
20.0000 mg | ORAL_TABLET | Freq: Every day | ORAL | 0 refills | Status: DC
  Filled 2021-01-26: qty 30, 30d supply, fill #0

## 2021-02-15 ENCOUNTER — Encounter: Payer: 59 | Admitting: Dermatology

## 2021-02-20 ENCOUNTER — Other Ambulatory Visit (HOSPITAL_COMMUNITY): Payer: Self-pay

## 2021-02-20 MED FILL — Doxepin HCl Cap 75 MG: ORAL | 30 days supply | Qty: 60 | Fill #2 | Status: AC

## 2021-02-21 ENCOUNTER — Other Ambulatory Visit (HOSPITAL_COMMUNITY): Payer: Self-pay

## 2021-02-22 ENCOUNTER — Other Ambulatory Visit (HOSPITAL_COMMUNITY): Payer: Self-pay

## 2021-02-22 DIAGNOSIS — F329 Major depressive disorder, single episode, unspecified: Secondary | ICD-10-CM | POA: Diagnosis not present

## 2021-02-22 DIAGNOSIS — F919 Conduct disorder, unspecified: Secondary | ICD-10-CM | POA: Diagnosis not present

## 2021-02-22 DIAGNOSIS — F4000 Agoraphobia, unspecified: Secondary | ICD-10-CM | POA: Diagnosis not present

## 2021-02-22 MED ORDER — ALPRAZOLAM 0.5 MG PO TABS
ORAL_TABLET | ORAL | 3 refills | Status: DC
  Filled 2021-02-22: qty 30, 30d supply, fill #0
  Filled 2021-03-29: qty 30, 30d supply, fill #1
  Filled 2021-04-30: qty 30, 30d supply, fill #2
  Filled 2021-05-29: qty 30, 30d supply, fill #3

## 2021-02-23 ENCOUNTER — Ambulatory Visit: Payer: 59 | Admitting: Internal Medicine

## 2021-02-26 ENCOUNTER — Other Ambulatory Visit (HOSPITAL_COMMUNITY): Payer: Self-pay

## 2021-02-26 MED ORDER — AMPHETAMINE-DEXTROAMPHETAMINE 20 MG PO TABS
20.0000 mg | ORAL_TABLET | ORAL | 0 refills | Status: DC
  Filled 2021-02-26: qty 30, 30d supply, fill #0

## 2021-02-26 MED ORDER — LISDEXAMFETAMINE DIMESYLATE 70 MG PO CAPS
70.0000 mg | ORAL_CAPSULE | Freq: Every morning | ORAL | 0 refills | Status: DC
  Filled 2021-02-26: qty 30, 30d supply, fill #0

## 2021-03-20 DIAGNOSIS — M1711 Unilateral primary osteoarthritis, right knee: Secondary | ICD-10-CM | POA: Diagnosis not present

## 2021-03-27 DIAGNOSIS — M1711 Unilateral primary osteoarthritis, right knee: Secondary | ICD-10-CM | POA: Diagnosis not present

## 2021-03-29 ENCOUNTER — Other Ambulatory Visit (HOSPITAL_COMMUNITY): Payer: Self-pay

## 2021-03-29 ENCOUNTER — Other Ambulatory Visit: Payer: Self-pay | Admitting: Obstetrics and Gynecology

## 2021-03-29 DIAGNOSIS — Z803 Family history of malignant neoplasm of breast: Secondary | ICD-10-CM

## 2021-03-29 MED ORDER — DOXEPIN HCL 50 MG PO CAPS
50.0000 mg | ORAL_CAPSULE | Freq: Every evening | ORAL | 3 refills | Status: DC
  Filled 2021-03-29: qty 30, 30d supply, fill #0
  Filled 2021-06-07: qty 30, 30d supply, fill #1
  Filled 2021-10-19: qty 30, 30d supply, fill #2

## 2021-03-29 MED ORDER — AMPHETAMINE-DEXTROAMPHETAMINE 20 MG PO TABS
20.0000 mg | ORAL_TABLET | Freq: Every day | ORAL | 0 refills | Status: DC
  Filled 2021-03-29: qty 30, 30d supply, fill #0

## 2021-03-29 MED ORDER — LISDEXAMFETAMINE DIMESYLATE 70 MG PO CAPS
70.0000 mg | ORAL_CAPSULE | Freq: Every morning | ORAL | 0 refills | Status: DC
  Filled 2021-03-29: qty 30, 30d supply, fill #0

## 2021-03-30 ENCOUNTER — Other Ambulatory Visit (HOSPITAL_COMMUNITY): Payer: Self-pay

## 2021-04-10 DIAGNOSIS — M1711 Unilateral primary osteoarthritis, right knee: Secondary | ICD-10-CM | POA: Diagnosis not present

## 2021-04-18 ENCOUNTER — Other Ambulatory Visit (HOSPITAL_COMMUNITY): Payer: Self-pay

## 2021-04-18 ENCOUNTER — Other Ambulatory Visit: Payer: Self-pay

## 2021-04-19 ENCOUNTER — Other Ambulatory Visit (HOSPITAL_COMMUNITY): Payer: Self-pay

## 2021-04-20 ENCOUNTER — Other Ambulatory Visit (HOSPITAL_COMMUNITY): Payer: Self-pay

## 2021-04-24 ENCOUNTER — Other Ambulatory Visit (HOSPITAL_COMMUNITY): Payer: Self-pay

## 2021-04-24 ENCOUNTER — Other Ambulatory Visit: Payer: Self-pay

## 2021-04-25 ENCOUNTER — Other Ambulatory Visit (HOSPITAL_COMMUNITY): Payer: Self-pay

## 2021-04-30 ENCOUNTER — Other Ambulatory Visit (HOSPITAL_COMMUNITY): Payer: Self-pay

## 2021-04-30 MED ORDER — AMPHETAMINE-DEXTROAMPHETAMINE 20 MG PO TABS
ORAL_TABLET | ORAL | 0 refills | Status: DC
  Filled 2021-04-30: qty 30, 30d supply, fill #0

## 2021-04-30 MED ORDER — LISDEXAMFETAMINE DIMESYLATE 70 MG PO CAPS
ORAL_CAPSULE | ORAL | 0 refills | Status: DC
  Filled 2021-04-30: qty 30, 30d supply, fill #0

## 2021-05-01 ENCOUNTER — Other Ambulatory Visit (HOSPITAL_COMMUNITY): Payer: Self-pay

## 2021-05-01 MED ORDER — GABAPENTIN 600 MG PO TABS
600.0000 mg | ORAL_TABLET | Freq: Three times a day (TID) | ORAL | 0 refills | Status: DC
  Filled 2021-05-01: qty 60, 20d supply, fill #0

## 2021-05-02 ENCOUNTER — Other Ambulatory Visit (HOSPITAL_COMMUNITY): Payer: Self-pay

## 2021-05-02 MED ORDER — ZOLPIDEM TARTRATE ER 12.5 MG PO TBCR
EXTENDED_RELEASE_TABLET | ORAL | 2 refills | Status: DC
  Filled 2021-05-02: qty 30, 30d supply, fill #0
  Filled 2021-05-29: qty 30, 30d supply, fill #1
  Filled 2021-06-28: qty 30, 30d supply, fill #2

## 2021-05-08 DIAGNOSIS — N760 Acute vaginitis: Secondary | ICD-10-CM | POA: Diagnosis not present

## 2021-05-08 DIAGNOSIS — Z01419 Encounter for gynecological examination (general) (routine) without abnormal findings: Secondary | ICD-10-CM | POA: Diagnosis not present

## 2021-05-08 DIAGNOSIS — Z23 Encounter for immunization: Secondary | ICD-10-CM | POA: Diagnosis not present

## 2021-05-08 DIAGNOSIS — Z124 Encounter for screening for malignant neoplasm of cervix: Secondary | ICD-10-CM | POA: Diagnosis not present

## 2021-05-10 ENCOUNTER — Other Ambulatory Visit (HOSPITAL_COMMUNITY): Payer: Self-pay

## 2021-05-10 MED ORDER — METRONIDAZOLE 500 MG PO TABS
500.0000 mg | ORAL_TABLET | Freq: Two times a day (BID) | ORAL | 0 refills | Status: DC
  Filled 2021-05-10: qty 14, 7d supply, fill #0

## 2021-05-14 ENCOUNTER — Other Ambulatory Visit (HOSPITAL_COMMUNITY): Payer: Self-pay

## 2021-05-14 ENCOUNTER — Other Ambulatory Visit: Payer: Self-pay | Admitting: Internal Medicine

## 2021-05-14 MED ORDER — CYCLOBENZAPRINE HCL 10 MG PO TABS
ORAL_TABLET | Freq: Three times a day (TID) | ORAL | 1 refills | Status: DC | PRN
Start: 2021-05-14 — End: 2021-11-23
  Filled 2021-05-14: qty 30, 10d supply, fill #0
  Filled 2021-07-30: qty 30, 10d supply, fill #1

## 2021-05-17 DIAGNOSIS — M1711 Unilateral primary osteoarthritis, right knee: Secondary | ICD-10-CM | POA: Diagnosis not present

## 2021-05-29 ENCOUNTER — Other Ambulatory Visit (HOSPITAL_COMMUNITY): Payer: Self-pay

## 2021-05-29 MED ORDER — GABAPENTIN 600 MG PO TABS
ORAL_TABLET | ORAL | 0 refills | Status: DC
  Filled 2021-05-29: qty 60, 20d supply, fill #0

## 2021-05-29 MED ORDER — FAMOTIDINE 20 MG PO TABS
20.0000 mg | ORAL_TABLET | Freq: Two times a day (BID) | ORAL | 0 refills | Status: DC
  Filled 2021-05-29: qty 60, 30d supply, fill #0

## 2021-06-07 ENCOUNTER — Other Ambulatory Visit (HOSPITAL_COMMUNITY): Payer: Self-pay

## 2021-06-07 MED ORDER — LISDEXAMFETAMINE DIMESYLATE 70 MG PO CAPS
ORAL_CAPSULE | ORAL | 0 refills | Status: DC
  Filled 2021-06-07: qty 30, 30d supply, fill #0

## 2021-06-07 MED ORDER — AMPHETAMINE-DEXTROAMPHETAMINE 20 MG PO TABS
ORAL_TABLET | ORAL | 0 refills | Status: DC
  Filled 2021-06-07: qty 30, 30d supply, fill #0

## 2021-06-14 ENCOUNTER — Encounter: Payer: Self-pay | Admitting: Internal Medicine

## 2021-06-28 ENCOUNTER — Other Ambulatory Visit (HOSPITAL_COMMUNITY): Payer: Self-pay

## 2021-06-29 ENCOUNTER — Other Ambulatory Visit (HOSPITAL_COMMUNITY): Payer: Self-pay

## 2021-06-29 MED ORDER — GABAPENTIN 600 MG PO TABS
600.0000 mg | ORAL_TABLET | Freq: Three times a day (TID) | ORAL | 0 refills | Status: DC
  Filled 2021-06-29: qty 60, 20d supply, fill #0

## 2021-07-03 ENCOUNTER — Other Ambulatory Visit (HOSPITAL_COMMUNITY): Payer: Self-pay

## 2021-07-03 DIAGNOSIS — F4000 Agoraphobia, unspecified: Secondary | ICD-10-CM | POA: Diagnosis not present

## 2021-07-03 DIAGNOSIS — F919 Conduct disorder, unspecified: Secondary | ICD-10-CM | POA: Diagnosis not present

## 2021-07-03 DIAGNOSIS — F329 Major depressive disorder, single episode, unspecified: Secondary | ICD-10-CM | POA: Diagnosis not present

## 2021-07-03 MED ORDER — DOXEPIN HCL 25 MG PO CAPS
ORAL_CAPSULE | ORAL | 3 refills | Status: DC
  Filled 2021-07-03: qty 60, 30d supply, fill #0
  Filled 2021-08-07: qty 60, 30d supply, fill #1
  Filled 2021-09-11: qty 60, 30d supply, fill #2

## 2021-07-03 MED ORDER — ALPRAZOLAM 0.5 MG PO TABS
ORAL_TABLET | ORAL | 3 refills | Status: DC
  Filled 2021-07-03: qty 30, 30d supply, fill #0
  Filled 2021-08-07: qty 30, 30d supply, fill #1
  Filled 2021-09-11: qty 30, 30d supply, fill #2
  Filled 2021-10-18: qty 30, 30d supply, fill #3

## 2021-07-04 ENCOUNTER — Other Ambulatory Visit (HOSPITAL_COMMUNITY): Payer: Self-pay

## 2021-07-10 ENCOUNTER — Other Ambulatory Visit (HOSPITAL_COMMUNITY): Payer: Self-pay

## 2021-07-10 MED ORDER — LISDEXAMFETAMINE DIMESYLATE 70 MG PO CAPS
ORAL_CAPSULE | ORAL | 0 refills | Status: DC
  Filled 2021-07-10: qty 30, 30d supply, fill #0

## 2021-07-10 MED ORDER — AMPHETAMINE-DEXTROAMPHETAMINE 20 MG PO TABS
ORAL_TABLET | ORAL | 0 refills | Status: DC
  Filled 2021-07-10: qty 30, 30d supply, fill #0

## 2021-07-11 ENCOUNTER — Other Ambulatory Visit (HOSPITAL_COMMUNITY): Payer: Self-pay

## 2021-07-25 ENCOUNTER — Telehealth: Payer: 59 | Admitting: Internal Medicine

## 2021-07-25 ENCOUNTER — Encounter: Payer: Self-pay | Admitting: Internal Medicine

## 2021-07-25 ENCOUNTER — Other Ambulatory Visit (HOSPITAL_COMMUNITY): Payer: Self-pay

## 2021-07-25 VITALS — Ht 67.0 in | Wt 200.0 lb

## 2021-07-25 DIAGNOSIS — F5105 Insomnia due to other mental disorder: Secondary | ICD-10-CM

## 2021-07-25 DIAGNOSIS — A0681 Amebic cystitis: Secondary | ICD-10-CM

## 2021-07-25 DIAGNOSIS — Z803 Family history of malignant neoplasm of breast: Secondary | ICD-10-CM

## 2021-07-25 DIAGNOSIS — E781 Pure hyperglyceridemia: Secondary | ICD-10-CM | POA: Diagnosis not present

## 2021-07-25 DIAGNOSIS — M545 Low back pain, unspecified: Secondary | ICD-10-CM | POA: Diagnosis not present

## 2021-07-25 DIAGNOSIS — Z6833 Body mass index (BMI) 33.0-33.9, adult: Secondary | ICD-10-CM

## 2021-07-25 DIAGNOSIS — F419 Anxiety disorder, unspecified: Secondary | ICD-10-CM | POA: Diagnosis not present

## 2021-07-25 DIAGNOSIS — E6609 Other obesity due to excess calories: Secondary | ICD-10-CM

## 2021-07-25 DIAGNOSIS — R300 Dysuria: Secondary | ICD-10-CM | POA: Diagnosis not present

## 2021-07-25 DIAGNOSIS — Z8616 Personal history of COVID-19: Secondary | ICD-10-CM

## 2021-07-25 DIAGNOSIS — F902 Attention-deficit hyperactivity disorder, combined type: Secondary | ICD-10-CM

## 2021-07-25 MED ORDER — AMOXICILLIN-POT CLAVULANATE 875-125 MG PO TABS
1.0000 | ORAL_TABLET | Freq: Two times a day (BID) | ORAL | 0 refills | Status: DC
  Filled 2021-07-25: qty 10, 5d supply, fill #0

## 2021-07-25 MED ORDER — TRAMADOL HCL 50 MG PO TABS
50.0000 mg | ORAL_TABLET | Freq: Three times a day (TID) | ORAL | 5 refills | Status: DC | PRN
  Filled 2021-07-25 – 2021-08-15 (×2): qty 90, 30d supply, fill #0
  Filled 2021-09-18: qty 90, 30d supply, fill #1
  Filled 2021-10-18: qty 90, 30d supply, fill #2
  Filled 2021-11-23: qty 90, 30d supply, fill #3
  Filled 2022-01-06: qty 90, 30d supply, fill #4
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
## 2021-07-28 ENCOUNTER — Encounter: Payer: Self-pay | Admitting: Internal Medicine

## 2021-07-28 DIAGNOSIS — R300 Dysuria: Secondary | ICD-10-CM | POA: Insufficient documentation

## 2021-07-28 NOTE — Assessment & Plan Note (Signed)
Improved with weight loss and careful diet.  Repeat testing is needed   Lab Results  Component Value Date   CHOL 194 01/13/2018   HDL 39.50 01/13/2018   LDLCALC 102 (H) 06/30/2014   LDLDIRECT 121.0 01/13/2018   TRIG 211.0 (H) 01/13/2018   CHOLHDL 5 01/13/2018

## 2021-07-28 NOTE — Assessment & Plan Note (Signed)
I have reviewed her  BMI and encouraged  Continued efforts to lose weight  with goal of 10% of body weight over the next 6 months using a low glycemic index diet and regular exercise a minimum of 5 days per week.

## 2021-07-28 NOTE — Assessment & Plan Note (Addendum)
Managed by Psychiatry with Adderall and Vyvanse.

## 2021-07-28 NOTE — Assessment & Plan Note (Signed)
Managed by psychiatry with doxepin

## 2021-07-28 NOTE — Assessment & Plan Note (Signed)
Secondary to disk herniation noted on 2019 MRI ordered by her  ORTHOPEDIST  HAS BEEN TAKING GABAPENTIN 600 MG TID and  and tramadol for pain relief,  Refills given for 6 months .  encouraged continued exercise to strengthen core muscles.

## 2021-07-28 NOTE — Assessment & Plan Note (Signed)
She is receiving surveillance (MRI alternating with mammogram ) by her gynecologist Dr Philis Pique.  Last imaging was Dec 2021 (MRI).  Ordered in Oct 2022 (same) but not done

## 2021-07-28 NOTE — Assessment & Plan Note (Addendum)
Patient has  A history of  a mild COVID 19 infection in January 2022 that did not require hospitalization or  use of antivirals.  There are no lingering symptoms

## 2021-07-28 NOTE — Assessment & Plan Note (Signed)
Continue 5 day course of aumgentin , started empirically by patient UA w micro and culture need to be done if symptoms persist

## 2021-07-30 ENCOUNTER — Other Ambulatory Visit (HOSPITAL_COMMUNITY): Payer: Self-pay

## 2021-07-31 ENCOUNTER — Other Ambulatory Visit (HOSPITAL_COMMUNITY): Payer: Self-pay

## 2021-07-31 MED ORDER — GABAPENTIN 600 MG PO TABS
600.0000 mg | ORAL_TABLET | Freq: Three times a day (TID) | ORAL | 0 refills | Status: DC
  Filled 2021-07-31: qty 60, 20d supply, fill #0

## 2021-08-01 ENCOUNTER — Other Ambulatory Visit (HOSPITAL_COMMUNITY): Payer: Self-pay

## 2021-08-01 MED ORDER — ZOLPIDEM TARTRATE ER 12.5 MG PO TBCR
EXTENDED_RELEASE_TABLET | ORAL | 2 refills | Status: DC
  Filled 2021-08-01: qty 30, 30d supply, fill #0
  Filled 2021-08-31: qty 30, 30d supply, fill #1
  Filled 2021-10-02: qty 30, 30d supply, fill #2

## 2021-08-07 ENCOUNTER — Other Ambulatory Visit (HOSPITAL_COMMUNITY): Payer: Self-pay

## 2021-08-14 DIAGNOSIS — N871 Moderate cervical dysplasia: Secondary | ICD-10-CM | POA: Diagnosis not present

## 2021-08-14 DIAGNOSIS — Z3202 Encounter for pregnancy test, result negative: Secondary | ICD-10-CM | POA: Diagnosis not present

## 2021-08-14 DIAGNOSIS — Z9189 Other specified personal risk factors, not elsewhere classified: Secondary | ICD-10-CM | POA: Diagnosis not present

## 2021-08-14 DIAGNOSIS — B977 Papillomavirus as the cause of diseases classified elsewhere: Secondary | ICD-10-CM | POA: Diagnosis not present

## 2021-08-15 ENCOUNTER — Other Ambulatory Visit (HOSPITAL_COMMUNITY): Payer: Self-pay

## 2021-08-15 MED ORDER — LISDEXAMFETAMINE DIMESYLATE 70 MG PO CAPS
ORAL_CAPSULE | ORAL | 0 refills | Status: DC
  Filled 2021-08-15: qty 30, 30d supply, fill #0

## 2021-08-15 MED ORDER — AMPHETAMINE-DEXTROAMPHETAMINE 20 MG PO TABS
ORAL_TABLET | ORAL | 0 refills | Status: DC
  Filled 2021-08-29: qty 30, 30d supply, fill #0

## 2021-08-29 ENCOUNTER — Other Ambulatory Visit (HOSPITAL_COMMUNITY): Payer: Self-pay

## 2021-08-30 ENCOUNTER — Other Ambulatory Visit (HOSPITAL_COMMUNITY): Payer: Self-pay

## 2021-08-30 MED ORDER — FAMOTIDINE 20 MG PO TABS
20.0000 mg | ORAL_TABLET | Freq: Two times a day (BID) | ORAL | 0 refills | Status: DC
  Filled 2021-08-30: qty 60, 30d supply, fill #0

## 2021-08-30 MED ORDER — GABAPENTIN 600 MG PO TABS
600.0000 mg | ORAL_TABLET | Freq: Three times a day (TID) | ORAL | 0 refills | Status: DC
  Filled 2021-08-30: qty 60, 20d supply, fill #0

## 2021-08-31 ENCOUNTER — Other Ambulatory Visit (HOSPITAL_COMMUNITY): Payer: Self-pay

## 2021-09-01 ENCOUNTER — Other Ambulatory Visit (HOSPITAL_COMMUNITY): Payer: Self-pay

## 2021-09-11 ENCOUNTER — Other Ambulatory Visit (HOSPITAL_COMMUNITY): Payer: Self-pay

## 2021-09-18 ENCOUNTER — Other Ambulatory Visit (HOSPITAL_COMMUNITY): Payer: Self-pay

## 2021-09-18 MED ORDER — AMPHETAMINE-DEXTROAMPHETAMINE 20 MG PO TABS
ORAL_TABLET | ORAL | 0 refills | Status: DC
  Filled 2021-09-18 – 2021-10-02 (×2): qty 30, 30d supply, fill #0

## 2021-09-18 MED ORDER — LISDEXAMFETAMINE DIMESYLATE 70 MG PO CAPS
ORAL_CAPSULE | ORAL | 0 refills | Status: DC
  Filled 2021-09-18: qty 30, 30d supply, fill #0

## 2021-10-02 ENCOUNTER — Other Ambulatory Visit: Payer: Self-pay

## 2021-10-02 ENCOUNTER — Other Ambulatory Visit (HOSPITAL_COMMUNITY): Payer: Self-pay

## 2021-10-03 ENCOUNTER — Other Ambulatory Visit (HOSPITAL_COMMUNITY): Payer: Self-pay

## 2021-10-04 ENCOUNTER — Other Ambulatory Visit (HOSPITAL_COMMUNITY): Payer: Self-pay

## 2021-10-05 ENCOUNTER — Encounter: Payer: Self-pay | Admitting: Internal Medicine

## 2021-10-08 ENCOUNTER — Other Ambulatory Visit (HOSPITAL_COMMUNITY): Payer: Self-pay

## 2021-10-08 MED ORDER — GABAPENTIN 600 MG PO TABS
600.0000 mg | ORAL_TABLET | Freq: Three times a day (TID) | ORAL | 0 refills | Status: DC
  Filled 2021-10-08: qty 60, 20d supply, fill #0

## 2021-10-08 MED ORDER — IBUPROFEN 800 MG PO TABS
ORAL_TABLET | ORAL | 4 refills | Status: DC
  Filled 2021-10-08: qty 90, 30d supply, fill #0
  Filled 2021-12-15: qty 90, 30d supply, fill #1
  Filled 2022-04-10: qty 90, 30d supply, fill #2
  Filled 2022-07-11: qty 90, 30d supply, fill #3
  Filled 2022-08-12: qty 90, 30d supply, fill #4

## 2021-10-11 ENCOUNTER — Other Ambulatory Visit: Payer: 59

## 2021-10-13 ENCOUNTER — Other Ambulatory Visit (HOSPITAL_COMMUNITY): Payer: Self-pay

## 2021-10-16 ENCOUNTER — Other Ambulatory Visit: Payer: 59

## 2021-10-17 ENCOUNTER — Other Ambulatory Visit: Payer: 59

## 2021-10-18 ENCOUNTER — Ambulatory Visit: Payer: 59 | Admitting: Dermatology

## 2021-10-18 ENCOUNTER — Other Ambulatory Visit (HOSPITAL_COMMUNITY): Payer: Self-pay

## 2021-10-18 ENCOUNTER — Encounter: Payer: Self-pay | Admitting: Dermatology

## 2021-10-18 DIAGNOSIS — L905 Scar conditions and fibrosis of skin: Secondary | ICD-10-CM

## 2021-10-18 DIAGNOSIS — R21 Rash and other nonspecific skin eruption: Secondary | ICD-10-CM | POA: Diagnosis not present

## 2021-10-18 DIAGNOSIS — L700 Acne vulgaris: Secondary | ICD-10-CM | POA: Diagnosis not present

## 2021-10-18 DIAGNOSIS — L304 Erythema intertrigo: Secondary | ICD-10-CM

## 2021-10-18 MED ORDER — DAPSONE 7.5 % EX GEL
CUTANEOUS | 2 refills | Status: DC
  Filled 2021-10-18: qty 60, 30d supply, fill #0
  Filled 2021-11-23: qty 60, 30d supply, fill #1
  Filled 2022-04-29: qty 60, 30d supply, fill #2

## 2021-10-18 MED ORDER — MOMETASONE FUROATE 0.1 % EX CREA
1.0000 "application " | TOPICAL_CREAM | Freq: Every day | CUTANEOUS | 0 refills | Status: DC | PRN
  Filled 2021-10-18: qty 45, 30d supply, fill #0

## 2021-10-18 MED ORDER — CLINDAMYCIN-TRETINOIN 1.2-0.025 % EX GEL
CUTANEOUS | 2 refills | Status: DC
  Filled 2021-10-18: qty 30, 30d supply, fill #0

## 2021-10-18 MED ORDER — FLUCONAZOLE 200 MG PO TABS
ORAL_TABLET | ORAL | 0 refills | Status: DC
  Filled 2021-10-18: qty 3, 3d supply, fill #0

## 2021-10-18 NOTE — Progress Notes (Signed)
Follow-Up Visit   Subjective  Sonia Holden is a 36 y.o. female who presents for the following: Rash (Dur: 3 weeks. Abdomen, groin creases, hands/fingers. States broke out with something that she feels may have looked like psoriasis and had some skin swelling over the joints of her hands that she feels could represent psoriatic arthritis.  Woke up one morning and finger joints "looked like sausages".  Stated it felt like the actual joints were itching.  Grandmother has psoriasis.  Patient has + rheumatoid factor per rheumatologist.  Has been using Rx steroid cream HC 2.5% (belongs to sister) alternating with tretinoin cream, has been getting some sun exposure recently so rash has improved with this regimen).  Denies tick bites or headaches.  C/O extreme fatigue that has been improving recently. Has had H/O fatigue in the past.  Went to rheumatologist in past due to lab work abnormalities.  She is not on any treatment for rheumatoid arthritis currently. The patient has spots, moles and lesions to be evaluated, some may be new or changing and the patient has concerns that these could be cancer.  The following portions of the chart were reviewed this encounter and updated as appropriate:  Tobacco  Allergies  Meds  Problems  Med Hx  Surg Hx  Fam Hx     Review of Systems: No other skin or systemic complaints except as noted in HPI or Assessment and Plan.  Objective  Well appearing patient in no apparent distress; mood and affect are within normal limits.  A full examination was performed including scalp, head, eyes, ears, nose, lips, neck, chest, axillae, abdomen, back, buttocks, bilateral upper extremities, bilateral lower extremities, hands, feet, fingers, toes, fingernails, and toenails. All findings within normal limits unless otherwise noted below.  abdomen, right shoulder, hands Erythema at groin, guttate pink macules at abdomen     inguinal creases Macular  erythematous patches  face Scattered pink macules          Assessment & Plan  Rash abdomen, right shoulder, hands Most consistent with Eczema /Atopic Dermatitis  Rash does not present like psoriasis on clinical exam. (versus contact dermatitis versus drug reaction) Start Mometasone cream twice daily to affected areas on body up to 2 weeks as needed, then decrease to 5 days a week as needed. Avoid applying to face, groin, and axilla. Use as directed. Long-term use can cause thinning of the skin.  Recent history of extreme fatigue; joint swelling and history of positive rheumatoid factor Recommend follow up with rheumatologist as all these symptoms and signs could be related to rheumatoid arthritis.  Advised rheumatoid arthritis is a systemic disease and does not necessarily just involve joints. (No history of tick bite or headaches)  Intertrigo inguinal creases Chronic and persistent condition with duration or expected duration over one year. Condition is symptomatic / bothersome to patient. Not to goal. Intertrigo is a chronic recurrent rash that occurs in skin fold areas that may be associated with friction; heat; moisture; yeast; fungus; and bacteria.  It is exacerbated by increased movement / activity; sweating; and higher atmospheric temperature.  Start Iodoquinol: 1%, Hydrocortisone: 2.5%, Niacinamide: 2% cream twice daily to affected areas in groin.  Start Fluconazole 200 mg 1 tablet M, W, F   fluconazole (DIFLUCAN) 200 MG tablet - inguinal creases Take 1 tablet by mouth on Monday, Wednesday and Friday  Acne vulgaris With active papules and scarring face Chronic and persistent condition with duration or expected duration over one year. Condition  is symptomatic / bothersome to patient. Not to goal. Start Ziana gel at bedtime, wash off in morning.  Start Aczone to face in morning.  Can try Winlevi, Spironolactone if not improving.  Discussed other oral treatments -may  consider in the future.  Dapsone (ACZONE) 7.5 % GEL - face Apply to face once every morning clindamycin-tretinoin (ZIANA) gel - face Apply to face at bedtime and wash off every morning  Return in about 2 months (around 12/18/2021) for Rash Follow Up.  I, Emelia Salisbury, CMA, am acting as scribe for Sarina Ser, MD. Documentation: I have reviewed the above documentation for accuracy and completeness, and I agree with the above.  Sarina Ser, MD

## 2021-10-18 NOTE — Patient Instructions (Addendum)
Inguinal creases/groin Start Fluconazole 200 mg 1 tablet M, W, F  Start Iodoquinol: 1%, Hydrocortisone: 2.5%, Niacinamide: 2% cream twice daily to affected areas in groin.   Rash/Body: Start Mometasone cream twice daily to affected areas on body up to 2 weeks as needed. Avoid applying to face, groin, and axilla. Use as directed. Long-term use can cause thinning of the skin.  Face Start Ziana gel at bedtime, wash off in morning.  Start Aczone to face in morning.    Recommend follow up with rheumatologist regarding fatigue.   Instructions for [facility] Medications  One or more of your medications was sent to the [facility] mail order compounding pharmacy. You will receive an email from them and can purchase the medicine through that link. It will then be mailed to your home at the address you confirmed. If for any reason you do not receive an email from them, please check your spam folder. If you still do not find the email, please let us know. [facility] phone number is [phone].   Gentle Skin Care Guide  1. Bathe no more than once a day.  2. Avoid bathing in hot water  3. Use a mild soap like Dove, Vanicream, Cetaphil, CeraVe. Can use Lever 2000 or Cetaphil antibacterial soap  4. Use soap only where you need it. On most days, use it under your arms, between your legs, and on your feet. Let the water rinse other areas unless visibly dirty.  5. When you get out of the bath/shower, use a towel to gently blot your skin dry, don't rub it.  6. While your skin is still a little damp, apply a moisturizing cream such as Vanicream, CeraVe, Cetaphil, Eucerin, Sarna lotion or plain Vaseline Jelly. For hands apply Neutrogena [place] Hand Cream or Excipial Hand Cream.  7. Reapply moisturizer any time you start to itch or feel dry.  8. Sometimes using free and clear laundry detergents can be helpful. Fabric softener sheets should be avoided. Downy Free & Gentle liquid, or  any liquid fabric softener that is free of dyes and perfumes, it acceptable to use  9. If your doctor has given you prescription creams you may apply moisturizers over them      If You Need Anything After Your Visit  If you have any questions or concerns for your doctor, please call our main line at [phone] and press option 4 to reach your doctor's medical assistant. If no one answers, please leave a voicemail as directed and we will return your call as soon as possible. Messages left after 4 pm will be answered the following business day.   You may also send [place] a message via [place]. We typically respond to MyChart messages within 1-2 business days.  For prescription refills, please ask your pharmacy to contact our office. Our fax number is [phone].  If you have an urgent issue when the clinic is closed that cannot wait until the next business day, you can page your doctor at the number below.    Please note that while we do our best to be available for urgent issues outside of office hours, we are not available 24/7.   If you have an urgent issue and are unable to reach [place], you may choose to seek medical care at your doctor's office, retail clinic, urgent care center, or emergency room.  If you have a medical emergency, please immediately call 911 or go to the emergency department.  Pager Numbers  -  Dr. Nehemiah Massed: [phone]  - Dr. Laurence Ferrari: [phone]  - Dr. Nicole Kindred: [phone]  In the event of inclement weather, please call our main line at [phone] for an update on the status of any delays or closures.  Dermatology Medication Tips: Please keep the boxes that topical medications come in in order to help keep track of the instructions about where and how to use these. Pharmacies typically print the medication instructions only on the boxes and not directly on the medication tubes.   If your medication is too expensive, please contact our office at [phone]  option 4 or send [place] a message through [place].   We are unable to tell what your co-pay for medications will be in advance as this is different depending on your insurance coverage. However, we may be able to find a substitute medication at lower cost or fill out paperwork to get insurance to cover a needed medication.   If a prior authorization is required to get your medication covered by your insurance company, please allow [place] 1-2 business days to complete this process.  Drug prices often vary depending on where the prescription is filled and some pharmacies may offer cheaper prices.  The website www.goodrx.com contains coupons for medications through different pharmacies. The prices here do not account for what the cost may be with help from insurance (it may be cheaper with your insurance), but the website can give you the price if you did not use any insurance.  - You can print the associated coupon and take it with your prescription to the pharmacy.  - You may also stop by our office during regular business hours and pick up a GoodRx coupon card.  - If you need your prescription sent electronically to a different pharmacy, notify our office through [facility] or by phone at [phone] option 4.     Si Usted Necesita Algo Despus de Su Visita  Tambin puede enviarnos un mensaje a travs de Pharmacist, community. Por lo general respondemos a los mensajes de MyChart en el transcurso de 1 a 2 das hbiles.  Para renovar recetas, por favor pida a su farmacia que se ponga en contacto con nuestra oficina. Harland Dingwall de fax es [place] [phone].  Si tiene un asunto urgente cuando la clnica est cerrada y que no puede esperar hasta el siguiente da hbil, puede llamar/localizar a su doctor(a) al nmero que aparece a continuacin.   Por favor, tenga en cuenta que aunque hacemos todo lo posible para estar disponibles para asuntos urgentes fuera del horario de [place], no estamos disponibles las  24 horas del da, los 7 das de la [place].   Si tiene un problema urgente y no puede comunicarse con nosotros, puede optar por buscar atencin mdica  en el consultorio de su doctor(a), en una clnica privada, en un centro de atencin urgente o en una sala de emergencias.  Si tiene Engineering geologist, por favor llame inmediatamente al 911 o vaya a la sala de emergencias.  Nmeros de bper  - Dr. Nehemiah Massed: [phone]  - Dra. Moye: [phone]  - Dra. Nicole Kindred: [phone]  En caso de inclemencias del [place], por favor llame a Johnsie Kindred principal al [phone] para una actualizacin sobre el [place] de cualquier retraso o cierre.  Consejos para la medicacin en dermatologa: Por favor, guarde las cajas en las que vienen los medicamentos de uso tpico para ayudarle a seguir las instrucciones sobre dnde y cmo usarlos. [place] instrucciones del medicamento  slo en las cajas y no directamente en los tubos del medicamento.   Si su medicamento es muy caro, por favor, pngase en contacto con Zigmund Daniel llamando al [phone] y presione la opcin 4 o envenos un mensaje a travs de Pharmacist, community.   No podemos decirle cul ser su copago por los medicamentos por adelantado ya que esto es diferente dependiendo de la cobertura de su seguro. Sin embargo, es posible que podamos encontrar un medicamento sustituto a Electrical engineer un formulario para que el seguro cubra el medicamento que se considera necesario.   Si se requiere una autorizacin previa para que su compaa de seguros [place] su medicamento, por favor permtanos de 1 a 2 das hbiles para completar este proceso.  Los precios de los medicamentos varan con frecuencia dependiendo del Environmental consultant de dnde se surte la receta y alguna farmacias pueden ofrecer precios ms baratos.  El sitio web www.goodrx.com tiene cupones para medicamentos de Airline pilot. Los precios aqu no tienen en cuenta lo  que podra costar con la ayuda del seguro (puede ser ms barato con su seguro), pero el sitio web puede darle el precio si no utiliz Research scientist (physical sciences).  - Puede imprimir el cupn correspondiente y llevarlo con su receta a la farmacia.  - Tambin puede pasar por nuestra oficina durante el horario de atencin regular y Charity fundraiser una tarjeta de cupones de GoodRx.  - Si necesita que su receta se enve electrnicamente a una farmacia diferente, informe a nuestra oficina a travs de MyChart de [place] o por telfono llamando al [phone] y presione la opcin 4.

## 2021-10-19 ENCOUNTER — Other Ambulatory Visit: Payer: 59

## 2021-10-19 ENCOUNTER — Other Ambulatory Visit (HOSPITAL_COMMUNITY): Payer: Self-pay

## 2021-10-22 ENCOUNTER — Other Ambulatory Visit (HOSPITAL_COMMUNITY): Payer: Self-pay

## 2021-10-23 ENCOUNTER — Other Ambulatory Visit (HOSPITAL_COMMUNITY): Payer: Self-pay

## 2021-10-23 MED ORDER — LISDEXAMFETAMINE DIMESYLATE 70 MG PO CAPS
70.0000 mg | ORAL_CAPSULE | Freq: Every morning | ORAL | 0 refills | Status: DC
  Filled 2021-10-23: qty 30, 30d supply, fill #0
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
## 2021-10-28 ENCOUNTER — Encounter: Payer: Self-pay | Admitting: Dermatology

## 2021-10-30 ENCOUNTER — Other Ambulatory Visit: Payer: Self-pay

## 2021-10-30 ENCOUNTER — Other Ambulatory Visit (HOSPITAL_COMMUNITY): Payer: Self-pay

## 2021-10-31 ENCOUNTER — Other Ambulatory Visit (HOSPITAL_COMMUNITY): Payer: Self-pay

## 2021-11-01 ENCOUNTER — Other Ambulatory Visit (HOSPITAL_COMMUNITY): Payer: Self-pay

## 2021-11-01 DIAGNOSIS — M1711 Unilateral primary osteoarthritis, right knee: Secondary | ICD-10-CM | POA: Diagnosis not present

## 2021-11-01 MED ORDER — ZOLPIDEM TARTRATE ER 12.5 MG PO TBCR
EXTENDED_RELEASE_TABLET | ORAL | 2 refills | Status: DC
  Filled 2021-11-01: qty 30, 30d supply, fill #0
  Filled 2021-11-08: qty 30, 30d supply, fill #1
  Filled 2021-12-07: qty 30, 30d supply, fill #2

## 2021-11-02 ENCOUNTER — Other Ambulatory Visit: Payer: Self-pay

## 2021-11-02 ENCOUNTER — Other Ambulatory Visit (HOSPITAL_COMMUNITY): Payer: Self-pay

## 2021-11-02 ENCOUNTER — Encounter: Payer: Self-pay | Admitting: Internal Medicine

## 2021-11-02 ENCOUNTER — Ambulatory Visit (INDEPENDENT_AMBULATORY_CARE_PROVIDER_SITE_OTHER): Payer: 59

## 2021-11-02 ENCOUNTER — Ambulatory Visit: Payer: 59 | Admitting: Internal Medicine

## 2021-11-02 VITALS — BP 124/86 | HR 103 | Temp 97.9°F | Ht 67.0 in | Wt 201.0 lb

## 2021-11-02 DIAGNOSIS — Z889 Allergy status to unspecified drugs, medicaments and biological substances status: Secondary | ICD-10-CM | POA: Diagnosis not present

## 2021-11-02 DIAGNOSIS — F419 Anxiety disorder, unspecified: Secondary | ICD-10-CM | POA: Diagnosis not present

## 2021-11-02 DIAGNOSIS — M79641 Pain in right hand: Secondary | ICD-10-CM | POA: Diagnosis not present

## 2021-11-02 DIAGNOSIS — M79642 Pain in left hand: Secondary | ICD-10-CM

## 2021-11-02 DIAGNOSIS — E781 Pure hyperglyceridemia: Secondary | ICD-10-CM

## 2021-11-02 DIAGNOSIS — F5105 Insomnia due to other mental disorder: Secondary | ICD-10-CM

## 2021-11-02 DIAGNOSIS — R5383 Other fatigue: Secondary | ICD-10-CM

## 2021-11-02 DIAGNOSIS — L299 Pruritus, unspecified: Secondary | ICD-10-CM

## 2021-11-02 DIAGNOSIS — E66811 Obesity, class 1: Secondary | ICD-10-CM

## 2021-11-02 DIAGNOSIS — F418 Other specified anxiety disorders: Secondary | ICD-10-CM | POA: Diagnosis not present

## 2021-11-02 DIAGNOSIS — R4589 Other symptoms and signs involving emotional state: Secondary | ICD-10-CM

## 2021-11-02 DIAGNOSIS — Z6833 Body mass index (BMI) 33.0-33.9, adult: Secondary | ICD-10-CM

## 2021-11-02 DIAGNOSIS — E6609 Other obesity due to excess calories: Secondary | ICD-10-CM

## 2021-11-02 DIAGNOSIS — R7301 Impaired fasting glucose: Secondary | ICD-10-CM

## 2021-11-02 DIAGNOSIS — M7989 Other specified soft tissue disorders: Secondary | ICD-10-CM | POA: Diagnosis not present

## 2021-11-02 HISTORY — DX: Allergy status to unspecified drugs, medicaments and biological substances: Z88.9

## 2021-11-02 HISTORY — DX: Other symptoms and signs involving emotional state: R45.89

## 2021-11-02 LAB — TSH: TSH: 2.47 u[IU]/mL (ref 0.35–5.50)

## 2021-11-02 LAB — LIPID PANEL
Cholesterol: 209 mg/dL — ABNORMAL HIGH (ref 0–200)
HDL: 52 mg/dL (ref >39.00)
LDL Cholesterol: 137 mg/dL — ABNORMAL HIGH (ref 0–99)
NonHDL: 157.17
Total CHOL/HDL Ratio: 4
Triglycerides: 101 mg/dL (ref 0.0–149.0)
VLDL: 20.2 mg/dL (ref 0.0–40.0)

## 2021-11-02 LAB — COMPREHENSIVE METABOLIC PANEL
ALT: 15 U/L (ref 0–35)
AST: 11 U/L (ref 0–37)
Albumin: 4.7 g/dL (ref 3.5–5.2)
Alkaline Phosphatase: 58 U/L (ref 39–117)
BUN: 16 mg/dL (ref 6–23)
CO2: 25 mEq/L (ref 19–32)
Calcium: 9.9 mg/dL (ref 8.4–10.5)
Chloride: 105 mEq/L (ref 96–112)
Creatinine, Ser: 0.54 mg/dL (ref 0.40–1.20)
GFR: 117.17 mL/min (ref >60.00)
Glucose, Bld: 89 mg/dL (ref 70–99)
Potassium: 4.3 mEq/L (ref 3.5–5.1)
Sodium: 139 mEq/L (ref 135–145)
Total Bilirubin: 0.4 mg/dL (ref 0.2–1.2)
Total Protein: 7.6 g/dL (ref 6.0–8.3)

## 2021-11-02 LAB — CBC WITH DIFFERENTIAL/PLATELET
Basophils Absolute: 0 10*3/uL (ref 0.0–0.1)
Basophils Relative: 0.1 % (ref 0.0–3.0)
Eosinophils Absolute: 0 10*3/uL (ref 0.0–0.7)
Eosinophils Relative: 0.1 % (ref 0.0–5.0)
HCT: 45.6 % (ref 36.0–46.0)
Hemoglobin: 14.9 g/dL (ref 12.0–15.0)
Lymphocytes Relative: 13.1 % (ref 12.0–46.0)
Lymphs Abs: 2.1 10*3/uL (ref 0.7–4.0)
MCHC: 32.6 g/dL (ref 30.0–36.0)
MCV: 90.2 fl (ref 78.0–100.0)
Monocytes Absolute: 1 10*3/uL (ref 0.1–1.0)
Monocytes Relative: 5.9 % (ref 3.0–12.0)
Neutro Abs: 13.1 10*3/uL — ABNORMAL HIGH (ref 1.4–7.7)
Neutrophils Relative %: 80.8 % — ABNORMAL HIGH (ref 43.0–77.0)
Platelets: 372 10*3/uL (ref 150.0–400.0)
RBC: 5.05 Mil/uL (ref 3.87–5.11)
RDW: 13.7 % (ref 11.5–15.5)
WBC: 16.2 10*3/uL — ABNORMAL HIGH (ref 4.0–10.5)

## 2021-11-02 LAB — IBC + FERRITIN
Ferritin: 23.7 ng/mL (ref 10.0–291.0)
Iron: 104 ug/dL (ref 42–145)
Saturation Ratios: 22.2 % (ref 20.0–50.0)
TIBC: 469 ug/dL — ABNORMAL HIGH (ref 250.0–450.0)
Transferrin: 335 mg/dL (ref 212.0–360.0)

## 2021-11-02 LAB — C-REACTIVE PROTEIN: CRP: <1 mg/dL (ref 0.5–20.0)

## 2021-11-02 LAB — HEMOGLOBIN A1C: Hgb A1c MFr Bld: 5.4 % (ref 4.6–6.5)

## 2021-11-02 LAB — SEDIMENTATION RATE: Sed Rate: 11 mm/hr (ref 0–20)

## 2021-11-02 MED ORDER — PREDNISONE 10 MG PO TABS
ORAL_TABLET | ORAL | 0 refills | Status: DC
  Filled 2021-11-02 (×2): qty 33, 8d supply, fill #0

## 2021-11-02 MED ORDER — FAMOTIDINE 20 MG PO TABS
20.0000 mg | ORAL_TABLET | Freq: Two times a day (BID) | ORAL | 1 refills | Status: DC
Start: 2021-11-02 — End: 2022-11-30
  Filled 2021-11-02 (×2): qty 180, 90d supply, fill #0
  Filled 2022-04-29: qty 180, 90d supply, fill #1

## 2021-11-02 MED ORDER — MONTELUKAST SODIUM 10 MG PO TABS
10.0000 mg | ORAL_TABLET | Freq: Every day | ORAL | 3 refills | Status: DC
Start: 2021-11-02 — End: 2022-03-08
  Filled 2021-11-02 (×2): qty 30, 30d supply, fill #0
  Filled 2021-12-15: qty 30, 30d supply, fill #1

## 2021-11-02 NOTE — Assessment & Plan Note (Signed)
With decreased quality sleep, increased fatigue and daytime hypersomnolence.  Advised her to review her Doxepin dose with her psychiatrist before increasing her dose beyond 50 mg

## 2021-11-02 NOTE — Progress Notes (Signed)
Subjective:  Patient ID: Sonia Holden, female    DOB: [birth year]  Age: 36 y.o. MRN: [id number]  CC: The primary encounter diagnosis was Bilateral hand pain. Diagnoses of Hypertriglyceridemia, Other fatigue, Itching of both hands, Class 1 obesity due to excess calories without serious comorbidity with body mass index (BMI) of 33.0 to 33.9 in adult, Impaired fasting glucose, Anxiety about health, Insomnia secondary to anxiety, and Atopy were also pertinent to this visit.   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   1) RASH:  seen by [facility] may 18. With multiple skin complaints, worried that she had psoriasis::  eczema , intertrigo,  and acne were all diagnosed and treated.  She had NO EVIDENCE OF PSORIASIS  2) joint pain:  lower back,  right knee, and MCPs bilaterally has noticed changes in hands.  She reports Morning stiffness lasts hours.  Joints in hands swell and itch.   Reportedly had a positive Rheumatoid factor 3 years ago during initial consult by dr. Estanislado Pandy,  but records are not available and she has not had a follow up appt since then.  Has one scheduled for next week    3) itching of palms and joints of hands.  Facial itching. Spots on torso and and chest  that no longer itch  4) New ganglionic cyst on dorsum of right hand,  first noticed it yesterday.   5) Recurrent allergic reactions:  yesterday developed whelps on hands,  lips started tingling and lip became swollen.  Had used neutrogena sunblock  all over body prior to being outside,   remote allergy testing "allergic to everything"     but had stopped using famotidine /antihistamine   6) fatigue/depression:  doesn't want to get out of bed.   Waking up exhausted.  Several recent deaths, feels increased stress.  Seeing psychiatry.    Using doxepin  for insomnia but has reduced dose from150 to 50 mg .  Takes "several hours" to fall asleep    Outpatient Medications Prior to Visit  Medication Sig Dispense Refill   ALPRAZolam (XANAX) 0.5 MG tablet Take 1/2 to 1 tablet by mouth daily as needed for anxiety 30 tablet 3   amphetamine-dextroamphetamine (ADDERALL) 20 MG tablet Take 1 tablet by mouth every afternoon as needed. 30 tablet 0   cyclobenzaprine (FLEXERIL) 10 MG tablet TAKE 1 TABLET BY MOUTH 3 TIMES DAILY AS NEEDED FOR MUSCLE SPASMS 30 tablet 1   Dapsone (ACZONE) 7.5 % GEL Apply to face once every morning 60 g 2   doxepin (SINEQUAN) 25 MG capsule Take 1 to 2 capsules by mouth at bedtime 60 capsule 3   doxepin (SINEQUAN) 50 MG capsule Take 1 capsule (50 mg total) by mouth at bedtime. 30 capsule 3   gabapentin (NEURONTIN) 600 MG tablet Take 1 tablet by mouth 3 times daily. 60 tablet 0   ibuprofen (ADVIL) 800 MG tablet Take 1 tablet by mouth every 8 hours as needed. 90 tablet 4   lisdexamfetamine (VYVANSE) 70 MG capsule TAKE 1 CAPSULE BY MOUTH EVERY MORNING 30 capsule 0   lisdexamfetamine (VYVANSE) 70 MG capsule TAKE 1 CAPSULE BY MOUTH EVERY MORNING 30 capsule 0   lisdexamfetamine (VYVANSE) 70 MG capsule Take 1 capsule (  70 mg total) by mouth in the morning. 30 capsule 0   mometasone (ELOCON) 0.1 % cream Apply 1 application to affected areas once daily as needed for rash. 45 g 0   traMADol (ULTRAM) 50 MG tablet Take 1 tablet by mouth every 8 (eight) hours as needed for chronic back pain 90 tablet 5   valACYclovir (VALTREX) 1000 MG tablet TAKE 2 TABLETS BY MOUTH AT ONSET OF SYMPTOMS OF COLD SORES THEN 2 TABLETS 12 HOURS LATER AS DIRECTED. 30 tablet 0   zolpidem (AMBIEN CR) 12.5 MG CR tablet Take 1 tablet by mouth every day at bedtime as needed for sleep 30 tablet 2   famotidine (PEPCID) 20 MG tablet Take 1 tablet by mouth 2 times a day 60  tablet 0   amoxicillin-clavulanate (AUGMENTIN) 875-125 MG tablet Take 1 tablet by mouth 2 (two) times daily. (Patient not taking: Reported on 11/02/2021) 10 tablet 0   clindamycin-tretinoin (ZIANA) gel Apply to face at bedtime and wash off every morning (Patient not taking: Reported on 11/02/2021) 30 g 2   fluconazole (DIFLUCAN) 200 MG tablet Take 1 tablet by mouth on Monday, Wednesday and Friday (Patient not taking: Reported on 11/02/2021) 3 tablet 0   No facility-administered medications prior to visit.    Review of Systems;  Patient denies headache, fevers, malaise, unintentional weight loss, skin rash, eye pain, sinus congestion and sinus pain, sore throat, dysphagia,  hemoptysis , cough, dyspnea, wheezing, chest pain, palpitations, orthopnea, edema, abdominal pain, nausea, melena, diarrhea, constipation, flank pain, dysuria, hematuria, urinary  Frequency, nocturia, numbness, tingling, seizures,  Focal weakness, Loss of consciousness,  Tremor,  and suicidal ideation.      Objective:  BP 124/86 (BP Location: Right Arm, Patient Position: Sitting, Cuff Size: Normal)   Pulse (!) 103   Temp 97.9 F (36.6 C) (Oral)   Ht '5\' 7"'$  (1.702 m)   Wt 201 lb (91.2 kg)   SpO2 98%   BMI 31.48 kg/m   BP Readings from Last 3 Encounters:  11/02/21 124/86  09/04/20 (!) 136/96  06/06/20 (!) 115/92    Wt Readings from Last 3 Encounters:  11/02/21 201 lb (91.2 kg)  07/25/21 200 lb (90.7 kg)  01/18/21 200 lb (90.7 kg)    General appearance: alert, cooperative and appears stated age Ears: normal TM's and external ear canals both ears Throat: upper lip slightly swollen .  mucosa, and tongue normal; teeth and gums normal Neck: no adenopathy, no carotid bruit, supple, symmetrical, trachea midline and thyroid not enlarged, symmetric, no tenderness/mass/nodules Back: symmetric, no curvature. ROM normal. No CVA tenderness. Lungs: clear to auscultation bilaterally Heart: regular rate and rhythm, S1, S2 normal,  no murmur, click, rub or gallop Abdomen: soft, non-tender; bowel sounds normal; no masses,  no organomegaly Pulses: 2+ and symmetric Skin: Skin color, texture, turgor normal. No new rashes or lesions.  Several  resolving hyperpigmented annular macules on torso  Lymph nodes: Cervical, supraclavicular, and axillary nodes normal. MSK:  no synovitis  Lab Results  Component Value Date   HGBA1C 5.4 01/13/2018   HGBA1C 5.5 11/13/2016   HGBA1C 5.4 06/30/2014    Lab Results  Component Value Date   CREATININE 0.69 04/06/2019   CREATININE 0.66 03/10/2019   CREATININE 0.64 01/13/2018    Lab Results  Component Value Date   WBC 10.2 04/21/2019   HGB 14.3 04/21/2019   HCT 43.0 04/21/2019   PLT 332 04/21/2019   GLUCOSE 95 04/06/2019   CHOL 194 01/13/2018  TRIG 211.0 (H) 01/13/2018   HDL 39.50 01/13/2018   LDLDIRECT 121.0 01/13/2018   LDLCALC 102 (H) 06/30/2014   ALT 46 (H) 04/06/2019   AST 29 04/06/2019   NA 138 04/06/2019   K 3.9 04/06/2019   CL 103 04/06/2019   CREATININE 0.69 04/06/2019   BUN 14 04/06/2019   CO2 23 04/06/2019   TSH 3.02 03/10/2019   INR 1.0 03/10/2019   HGBA1C 5.4 01/13/2018     Assessment & Plan:   Problem List Items Addressed This Visit     Anxiety about health    I have addressed each of her concerns today and reviewed prior radiographic films and orthopedic evaluations for knee pain secondary to ACL tear,  Etc.  Thus far there are no signs of RA; so I have  reassured her that the diagnosis of RA is unlikely given the lack of synovitis on exam; however to be complete I have ordered plain films of both hands and serologies in anticipation of her self scheduled repeat evaluation by Dr . Estanislado Pandy next week.        Atopy    Given her eczema on hands and history of environmental allergies (per patient not in ,   will recommend use of claritin/famotidine bid.  Prednisone taper requested for prn use and given.  Advised to add singulaire if symptoms persist.  Urged to consider referral to allergist       Hypertriglyceridemia   Relevant Orders   Lipid Profile   Insomnia secondary to anxiety    With decreased quality sleep, increased fatigue and daytime hypersomnolence.  Advised her to review her Doxepin dose with her psychiatrist before increasing her dose beyond 50 mg        Obesity   Other Visit Diagnoses     Bilateral hand pain    -  Primary   Relevant Orders   DG Hand Complete Left   DG Hand Complete Right   Sedimentation rate   C-reactive protein   Rheumatoid Factor   ANA   Cyclic citrul peptide antibody, IgG   IBC + Ferritin   Other fatigue       Relevant Orders   TSH   Comprehensive metabolic panel   Itching of both hands       Relevant Orders   CBC with Differential/Platelet   Impaired fasting glucose       Relevant Orders   HgB A1c       I spent a total of   50  minutes with this patient in a face to face visit on the date of this encounter reviewing the  October 2021 visit with me, most recent with patient's dermatologist on May 18 ,  all prior orthopedic evaluations including available knee films,  patient's sleep habits, and post visit ordering of testing and therapeutics.    Follow-up: Return in about 4 weeks (around 11/30/2021).   Crecencio Mc, MD

## 2021-11-02 NOTE — Assessment & Plan Note (Signed)
I have addressed each of her concerns today and reviewed prior radiographic films and orthopedic evaluations for knee pain secondary to ACL tear,  Etc.  Thus far there are no signs of RA; so I have  reassured her that the diagnosis of RA is unlikely given the lack of synovitis on exam; however to be complete I have ordered plain films of both hands and serologies in anticipation of her self scheduled repeat evaluation by Dr . Estanislado Pandy next week.

## 2021-11-02 NOTE — Patient Instructions (Addendum)
For the itching  Resume claritin up to 4 times daily.  At least two times daily  Resume  famotidine twice daily 20 mg  Add singuair once daily  if still itching with above regimen   For the joint swelling:  Tell Dr D you had x rays and labs done today for rheumatoid workup  For the fatigue:  Talk to Dr Constance Haw about increasing your Doxepin.   I have no experiencing in using doses over 50 mg

## 2021-11-02 NOTE — Assessment & Plan Note (Signed)
Given her eczema on hands and history of environmental allergies (per patient not in ,   will recommend use of claritin/famotidine bid.  Prednisone taper requested for prn use and given.  Advised to add singulaire if symptoms persist. Urged to consider referral to allergist

## 2021-11-05 LAB — RHEUMATOID FACTOR: Rheumatoid fact SerPl-aCnc: <14 IU/mL (ref <14)

## 2021-11-05 LAB — CYCLIC CITRUL PEPTIDE ANTIBODY, IGG: Cyclic Citrullin Peptide Ab: 81 UNITS — ABNORMAL HIGH

## 2021-11-05 LAB — ANA: Anti Nuclear Antibody (ANA): POSITIVE — AB

## 2021-11-05 LAB — ANTI-NUCLEAR AB-TITER (ANA TITER): ANA Titer 1: 1:80 {titer} — ABNORMAL HIGH

## 2021-11-07 ENCOUNTER — Encounter: Payer: Self-pay | Admitting: Rheumatology

## 2021-11-07 ENCOUNTER — Encounter: Payer: Self-pay | Admitting: Internal Medicine

## 2021-11-07 ENCOUNTER — Ambulatory Visit (INDEPENDENT_AMBULATORY_CARE_PROVIDER_SITE_OTHER): Payer: 59 | Admitting: Rheumatology

## 2021-11-07 ENCOUNTER — Other Ambulatory Visit (HOSPITAL_COMMUNITY): Payer: Self-pay

## 2021-11-07 VITALS — BP 129/91 | HR 128 | Resp 15 | Ht 67.0 in | Wt 201.6 lb

## 2021-11-07 DIAGNOSIS — M79642 Pain in left hand: Secondary | ICD-10-CM

## 2021-11-07 DIAGNOSIS — F419 Anxiety disorder, unspecified: Secondary | ICD-10-CM

## 2021-11-07 DIAGNOSIS — R768 Other specified abnormal immunological findings in serum: Secondary | ICD-10-CM

## 2021-11-07 DIAGNOSIS — R5383 Other fatigue: Secondary | ICD-10-CM

## 2021-11-07 DIAGNOSIS — R21 Rash and other nonspecific skin eruption: Secondary | ICD-10-CM | POA: Diagnosis not present

## 2021-11-07 DIAGNOSIS — E559 Vitamin D deficiency, unspecified: Secondary | ICD-10-CM | POA: Diagnosis not present

## 2021-11-07 DIAGNOSIS — S83511A Sprain of anterior cruciate ligament of right knee, initial encounter: Secondary | ICD-10-CM

## 2021-11-07 DIAGNOSIS — G8929 Other chronic pain: Secondary | ICD-10-CM

## 2021-11-07 DIAGNOSIS — F5105 Insomnia due to other mental disorder: Secondary | ICD-10-CM

## 2021-11-07 DIAGNOSIS — M25561 Pain in right knee: Secondary | ICD-10-CM

## 2021-11-07 DIAGNOSIS — M23611 Other spontaneous disruption of anterior cruciate ligament of right knee: Secondary | ICD-10-CM

## 2021-11-07 DIAGNOSIS — F17200 Nicotine dependence, unspecified, uncomplicated: Secondary | ICD-10-CM | POA: Diagnosis not present

## 2021-11-07 DIAGNOSIS — Z803 Family history of malignant neoplasm of breast: Secondary | ICD-10-CM

## 2021-11-07 DIAGNOSIS — M545 Low back pain, unspecified: Secondary | ICD-10-CM

## 2021-11-07 DIAGNOSIS — M79641 Pain in right hand: Secondary | ICD-10-CM

## 2021-11-08 ENCOUNTER — Encounter: Payer: Self-pay | Admitting: Internal Medicine

## 2021-11-08 ENCOUNTER — Other Ambulatory Visit (HOSPITAL_COMMUNITY): Payer: Self-pay

## 2021-11-08 MED ORDER — VYVANSE 30 MG PO CAPS
ORAL_CAPSULE | ORAL | 0 refills | Status: DC
  Filled 2021-11-08: qty 30, 30d supply, fill #0

## 2021-11-09 ENCOUNTER — Telehealth: Payer: 59 | Admitting: Internal Medicine

## 2021-11-11 NOTE — Addendum Note (Signed)
Addended by: Crecencio Mc on: 11/11/2021 09:36 PM   Modules accepted: Orders

## 2021-11-13 ENCOUNTER — Other Ambulatory Visit (HOSPITAL_COMMUNITY): Payer: Self-pay

## 2021-11-13 DIAGNOSIS — F329 Major depressive disorder, single episode, unspecified: Secondary | ICD-10-CM | POA: Diagnosis not present

## 2021-11-13 DIAGNOSIS — F919 Conduct disorder, unspecified: Secondary | ICD-10-CM | POA: Diagnosis not present

## 2021-11-13 DIAGNOSIS — F4000 Agoraphobia, unspecified: Secondary | ICD-10-CM | POA: Diagnosis not present

## 2021-11-13 MED ORDER — ALPRAZOLAM 0.5 MG PO TABS
ORAL_TABLET | ORAL | 3 refills | Status: DC
  Filled 2021-11-13: qty 30, 30d supply, fill #0
  Filled 2021-12-15: qty 30, 30d supply, fill #1
  Filled 2022-01-23: qty 30, 30d supply, fill #2
  Filled 2022-02-24: qty 30, 30d supply, fill #3

## 2021-11-13 MED ORDER — DOXEPIN HCL 50 MG PO CAPS
50.0000 mg | ORAL_CAPSULE | Freq: Every evening | ORAL | 3 refills | Status: DC
  Filled 2021-11-13: qty 60, 30d supply, fill #0
  Filled 2021-12-15: qty 60, 30d supply, fill #1
  Filled 2022-01-23: qty 60, 30d supply, fill #2
  Filled 2022-02-24 – 2022-02-25 (×2): qty 30, 15d supply, fill #3
  Filled 2022-03-12: qty 30, 15d supply, fill #4

## 2021-11-14 ENCOUNTER — Other Ambulatory Visit (HOSPITAL_COMMUNITY): Payer: Self-pay

## 2021-11-14 DIAGNOSIS — M1711 Unilateral primary osteoarthritis, right knee: Secondary | ICD-10-CM | POA: Diagnosis not present

## 2021-11-14 LAB — LUPUS ANTICOAGULANT EVAL W/ REFLEX
PTT-LA Screen: 36 s (ref <=40)
dRVVT: 30 s (ref <=45)

## 2021-11-14 LAB — PAN-ANCA
ANCA SCREEN: NEGATIVE
Myeloperoxidase Abs: <1 AI (ref <1.0)
Serine Protease 3: <1 AI (ref <1.0)

## 2021-11-14 LAB — CARDIOLIPIN ANTIBODIES, IGG, IGM, IGA
Anticardiolipin IgA: <2 APL-U/mL (ref <20.0)
Anticardiolipin IgG: <2 GPL-U/mL (ref <20.0)
Anticardiolipin IgM: <2 MPL-U/mL (ref <20.0)

## 2021-11-14 LAB — C3 AND C4
C3 Complement: 159 mg/dL (ref 83–193)
C4 Complement: 53 mg/dL (ref 15–57)

## 2021-11-14 LAB — ANTI-SCLERODERMA ANTIBODY: Scleroderma (Scl-70) (ENA) Antibody, IgG: <1 AI

## 2021-11-14 LAB — CRYOGLOBULIN

## 2021-11-14 LAB — BETA-2 GLYCOPROTEIN ANTIBODIES
Beta-2 Glyco 1 IgA: <2 U/mL (ref <20.0)
Beta-2 Glyco 1 IgM: <2 U/mL (ref <20.0)
Beta-2 Glyco I IgG: <2 U/mL (ref <20.0)

## 2021-11-14 LAB — VITAMIN D 25 HYDROXY (VIT D DEFICIENCY, FRACTURES): Vit D, 25-Hydroxy: 39 ng/mL (ref 30–100)

## 2021-11-14 LAB — SJOGRENS SYNDROME-B EXTRACTABLE NUCLEAR ANTIBODY: SSB (La) (ENA) Antibody, IgG: <1 AI

## 2021-11-14 LAB — SJOGRENS SYNDROME-A EXTRACTABLE NUCLEAR ANTIBODY: SSA (Ro) (ENA) Antibody, IgG: <1 AI

## 2021-11-14 LAB — ANTI-SMITH ANTIBODY: ENA SM Ab Ser-aCnc: <1 AI

## 2021-11-14 LAB — CK: Total CK: 31 U/L (ref 29–143)

## 2021-11-14 LAB — ANTI-DNA ANTIBODY, DOUBLE-STRANDED: ds DNA Ab: <1 IU/mL

## 2021-11-14 LAB — RNP ANTIBODY: Ribonucleic Protein(ENA) Antibody, IgG: <1 AI

## 2021-11-14 NOTE — Progress Notes (Signed)
We will discuss results at the follow-up visit.

## 2021-11-15 ENCOUNTER — Other Ambulatory Visit (HOSPITAL_COMMUNITY): Payer: Self-pay

## 2021-11-21 DIAGNOSIS — M1711 Unilateral primary osteoarthritis, right knee: Secondary | ICD-10-CM | POA: Diagnosis not present
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
## 2021-11-22 ENCOUNTER — Ambulatory Visit (INDEPENDENT_AMBULATORY_CARE_PROVIDER_SITE_OTHER): Payer: 59 | Admitting: Physician Assistant

## 2021-11-22 ENCOUNTER — Telehealth: Payer: Self-pay | Admitting: Rheumatology

## 2021-11-22 ENCOUNTER — Encounter: Payer: Self-pay | Admitting: Physician Assistant

## 2021-11-22 VITALS — BP 140/89 | HR 105 | Resp 16 | Ht 67.0 in | Wt 211.0 lb

## 2021-11-22 DIAGNOSIS — E559 Vitamin D deficiency, unspecified: Secondary | ICD-10-CM

## 2021-11-22 DIAGNOSIS — F17200 Nicotine dependence, unspecified, uncomplicated: Secondary | ICD-10-CM | POA: Diagnosis not present

## 2021-11-22 DIAGNOSIS — K5909 Other constipation: Secondary | ICD-10-CM

## 2021-11-22 DIAGNOSIS — F5105 Insomnia due to other mental disorder: Secondary | ICD-10-CM

## 2021-11-22 DIAGNOSIS — M79641 Pain in right hand: Secondary | ICD-10-CM

## 2021-11-22 DIAGNOSIS — R21 Rash and other nonspecific skin eruption: Secondary | ICD-10-CM | POA: Diagnosis not present

## 2021-11-22 DIAGNOSIS — M25561 Pain in right knee: Secondary | ICD-10-CM

## 2021-11-22 DIAGNOSIS — R768 Other specified abnormal immunological findings in serum: Secondary | ICD-10-CM | POA: Diagnosis not present

## 2021-11-22 DIAGNOSIS — M545 Low back pain, unspecified: Secondary | ICD-10-CM

## 2021-11-22 DIAGNOSIS — Z803 Family history of malignant neoplasm of breast: Secondary | ICD-10-CM

## 2021-11-22 DIAGNOSIS — M79642 Pain in left hand: Secondary | ICD-10-CM | POA: Diagnosis not present

## 2021-11-22 DIAGNOSIS — S83511A Sprain of anterior cruciate ligament of right knee, initial encounter: Secondary | ICD-10-CM | POA: Diagnosis not present

## 2021-11-22 DIAGNOSIS — G8929 Other chronic pain: Secondary | ICD-10-CM

## 2021-11-22 DIAGNOSIS — R5383 Other fatigue: Secondary | ICD-10-CM | POA: Diagnosis not present

## 2021-11-22 DIAGNOSIS — F419 Anxiety disorder, unspecified: Secondary | ICD-10-CM

## 2021-11-22 NOTE — Telephone Encounter (Signed)
Return for +ANA Follow-up disposition: U/s of hands   First available appointment for ultrasound is Thursday October 5th in the afternoon. Please advise date/time. Ok to wait until October and when does patient need to schedule follow up?

## 2021-11-22 NOTE — Telephone Encounter (Signed)
Please add patient to cancellation list. She needs to be scheduled for a ultrasound within the next 3-4 weeks.

## 2021-11-23 ENCOUNTER — Other Ambulatory Visit: Payer: Self-pay | Admitting: Internal Medicine

## 2021-11-23 ENCOUNTER — Other Ambulatory Visit (HOSPITAL_COMMUNITY): Payer: Self-pay

## 2021-11-23 MED ORDER — CYCLOBENZAPRINE HCL 10 MG PO TABS
ORAL_TABLET | Freq: Three times a day (TID) | ORAL | 1 refills | Status: DC | PRN
  Filled 2021-11-23: qty 30, 10d supply, fill #0

## 2021-11-26 ENCOUNTER — Other Ambulatory Visit (HOSPITAL_COMMUNITY): Payer: Self-pay

## 2021-11-26 MED ORDER — GABAPENTIN 600 MG PO TABS
600.0000 mg | ORAL_TABLET | Freq: Three times a day (TID) | ORAL | 0 refills | Status: DC
  Filled 2021-11-26: qty 60, 20d supply, fill #0

## 2021-11-27 ENCOUNTER — Ambulatory Visit: Payer: 59 | Admitting: Dermatology

## 2021-11-28 ENCOUNTER — Encounter: Payer: Self-pay | Admitting: Rheumatology

## 2021-11-28 ENCOUNTER — Ambulatory Visit: Payer: 59

## 2021-11-28 ENCOUNTER — Ambulatory Visit (INDEPENDENT_AMBULATORY_CARE_PROVIDER_SITE_OTHER): Payer: 59 | Admitting: Rheumatology

## 2021-11-28 VITALS — BP 145/94 | HR 108 | Resp 16 | Ht 67.0 in | Wt 208.0 lb

## 2021-11-28 DIAGNOSIS — M25511 Pain in right shoulder: Secondary | ICD-10-CM | POA: Diagnosis not present

## 2021-11-28 DIAGNOSIS — M79642 Pain in left hand: Secondary | ICD-10-CM | POA: Diagnosis not present

## 2021-11-28 DIAGNOSIS — M7918 Myalgia, other site: Secondary | ICD-10-CM | POA: Diagnosis not present

## 2021-11-28 DIAGNOSIS — F17200 Nicotine dependence, unspecified, uncomplicated: Secondary | ICD-10-CM

## 2021-11-28 DIAGNOSIS — M79641 Pain in right hand: Secondary | ICD-10-CM | POA: Diagnosis not present

## 2021-11-28 DIAGNOSIS — F419 Anxiety disorder, unspecified: Secondary | ICD-10-CM

## 2021-11-28 DIAGNOSIS — E559 Vitamin D deficiency, unspecified: Secondary | ICD-10-CM

## 2021-11-28 DIAGNOSIS — M25512 Pain in left shoulder: Secondary | ICD-10-CM

## 2021-11-28 DIAGNOSIS — K5909 Other constipation: Secondary | ICD-10-CM

## 2021-11-28 DIAGNOSIS — S83511A Sprain of anterior cruciate ligament of right knee, initial encounter: Secondary | ICD-10-CM

## 2021-11-28 DIAGNOSIS — R768 Other specified abnormal immunological findings in serum: Secondary | ICD-10-CM

## 2021-11-28 DIAGNOSIS — Z803 Family history of malignant neoplasm of breast: Secondary | ICD-10-CM

## 2021-11-28 DIAGNOSIS — M1711 Unilateral primary osteoarthritis, right knee: Secondary | ICD-10-CM

## 2021-11-28 DIAGNOSIS — G8929 Other chronic pain: Secondary | ICD-10-CM

## 2021-11-28 DIAGNOSIS — R002 Palpitations: Secondary | ICD-10-CM

## 2021-11-28 DIAGNOSIS — M545 Low back pain, unspecified: Secondary | ICD-10-CM | POA: Diagnosis not present

## 2021-11-28 DIAGNOSIS — R5383 Other fatigue: Secondary | ICD-10-CM

## 2021-11-28 DIAGNOSIS — F5105 Insomnia due to other mental disorder: Secondary | ICD-10-CM

## 2021-11-28 NOTE — Patient Instructions (Addendum)
1.  Please schedule an appointment with Dr. Phillip Heal in for a cyst on your right hand #2 we will refer you to cardiologist for evaluation of palpitations.  We also need clearance to start you on hydroxychloroquine as hydroxychloroquine can prolong QT interval. #3 there is interaction between Vyvanse and Plaquenil due to prolongation of QT interval.  Please ask your doctor if you can switch to some other medication besides Vyvanse. #4 we will refer you to Dr. Dagoberto Ligas for myofascial pain syndrome.     Hydroxychloroquine Tablets What is this medication? HYDROXYCHLOROQUINE (hye drox ee KLOR oh kwin) treats autoimmune conditions, such as rheumatoid arthritis and lupus. It works by slowing down an overactive immune system. It may also be used to prevent and treat malaria. It works by killing the parasite that causes malaria. It belongs to a group of medications called DMARDs. This medicine may be used for other purposes; ask your health care provider or pharmacist if you have questions. COMMON BRAND NAME(S): Plaquenil, Quineprox What should I tell my care team before I take this medication? They need to know if you have any of these conditions: Diabetes Eye disease, vision problems G6PD deficiency Heart disease History of irregular heartbeat If you often drink alcohol Kidney disease Liver disease Porphyria Psoriasis An unusual or allergic reaction to chloroquine, hydroxychloroquine, other medications, foods, dyes, or preservatives Pregnant or trying to get pregnant Breast-feeding How should I use this medication? Take this medication by mouth with a glass of water. Take it as directed on the prescription label. Do not cut, crush or chew this medication. Swallow the tablets whole. Take it with food. Do not take it more than directed. Take all of this medication unless your care team tells you to stop it early. Keep taking it even if you think you are better. Take products with antacids in them at  a different time of day than this medication. Take this medication 4 hours before or 4 hours after antacids. Talk to your care team if you have questions. Talk to your care team about the use of this medication in children. While this medication may be prescribed for selected conditions, precautions do apply. Overdosage: If you think you have taken too much of this medicine contact a poison control center or emergency room at once. NOTE: This medicine is only for you. Do not share this medicine with others. What if I miss a dose? If you miss a dose, take it as soon as you can. If it is almost time for your next dose, take only that dose. Do not take double or extra doses. What may interact with this medication? Do not take this medication with any of the following: Cisapride Dronedarone Pimozide Thioridazine This medication may also interact with the following: Ampicillin Antacids Cimetidine Cyclosporine Digoxin Kaolin Medications for diabetes, like insulin, glipizide, glyburide Medications for seizures like carbamazepine, phenobarbital, phenytoin Mefloquine Methotrexate Other medications that prolong the QT interval (cause an abnormal heart rhythm) Praziquantel This list may not describe all possible interactions. Give your health care provider a list of all the medicines, herbs, non-prescription drugs, or dietary supplements you use. Also tell them if you smoke, drink alcohol, or use illegal drugs. Some items may interact with your medicine. What should I watch for while using this medication? Visit your care team for regular checks on your progress. Tell your care team if your symptoms do not start to get better or if they get worse. You may need blood work done while  you are taking this medication. If you take other medications that can affect heart rhythm, you may need more testing. Talk to your care team if you have questions. Your vision may be tested before and during use of this  medication. Tell your care team right away if you have any change in your eyesight. This medication may cause serious skin reactions. They can happen weeks to months after starting the medication. Contact your care team right away if you notice fevers or flu-like symptoms with a rash. The rash may be red or purple and then turn into blisters or peeling of the skin. Or, you might notice a red rash with swelling of the face, lips or lymph nodes in your neck or under your arms. If you or your family notice any changes in your behavior, such as new or worsening depression, thoughts of harming yourself, anxiety, or other unusual or disturbing thoughts, or memory loss, call your care team right away. What side effects may I notice from receiving this medication? Side effects that you should report to your care team as soon as possible: Allergic reactions--skin rash, itching, hives, swelling of the face, lips, tongue, or throat Aplastic anemia--unusual weakness or fatigue, dizziness, headache, trouble breathing, increased bleeding or bruising Change in vision Heart rhythm changes--fast or irregular heartbeat, dizziness, feeling faint or lightheaded, chest pain, trouble breathing Infection--fever, chills, cough, or sore throat Low blood sugar (hypoglycemia)--tremors or shaking, anxiety, sweating, cold or clammy skin, confusion, dizziness, rapid heartbeat Muscle injury--unusual weakness or fatigue, muscle pain, dark yellow or brown urine, decrease in amount of urine Pain, tingling, or numbness in the hands or feet Rash, fever, and swollen lymph nodes Redness, blistering, peeling, or loosening of the skin, including inside the mouth Thoughts of suicide or self-harm, worsening mood, or feelings of depression Unusual bruising or bleeding Side effects that usually do not require medical attention (report to your care team if they continue or are bothersome): Diarrhea Headache Nausea Stomach  pain Vomiting This list may not describe all possible side effects. Call your doctor for medical advice about side effects. You may report side effects to FDA at 1-800-FDA-1088. Where should I keep my medication? Keep out of the reach of children and pets. Store at room temperature up to 30 degrees C (86 degrees F). Protect from light. Get rid of any unused medication after the expiration date. To get rid of medications that are no longer needed or have expired: Take the medication to a medication take-back program. Check with your pharmacy or law enforcement to find a location. If you cannot return the medication, check the label or package insert to see if the medication should be thrown out in the garbage or flushed down the toilet. If you are not sure, ask your care team. If it is safe to put it in the trash, empty the medication out of the container. Mix the medication with cat litter, dirt, coffee grounds, or other unwanted substance. Seal the mixture in a bag or container. Put it in the trash. NOTE: This sheet is a summary. It may not cover all possible information. If you have questions about this medicine, talk to your doctor, pharmacist, or health care provider.  2023 Elsevier/Gold Standard (2020-10-05 00:00:00)  Standing Labs We placed an order today for your standing lab work.   Please have your standing labs drawn in 1 month, 3 months and then every 5 months  If possible, please have your labs drawn 2 weeks prior to  your appointment so that the provider can discuss your results at your appointment.  Please note that you may see your imaging and lab results in [facility] before we have reviewed them. We may be awaiting multiple results to interpret others before contacting you. Please allow our office up to 72 hours to thoroughly review all of the results before contacting the office for clarification of your results.  We have open lab daily: Monday through Thursday from 1:30-4:30  PM and Friday from 1:30-4:00 PM at the office of Dr. Bo Merino, [facility].   Please be advised, all patients with office appointments requiring lab work will take precedent over walk-in lab work.  If possible, please come for your lab work on Monday and Friday afternoons, as you may experience shorter wait times. The office is located at [address] [place], [place], [place] [address] No appointment is necessary.   Labs are drawn by [facility]. Please bring your co-pay at the time of your lab draw.  You may receive a bill from [facility] for your lab work.  Please note if you are on Hydroxychloroquine and and an order has been placed for a Hydroxychloroquine level, you will need to have it drawn 4 hours or more after your last dose.  If you wish to have your labs drawn at another location, please call the office 24 hours in advance to send orders.  If you have any questions regarding directions or hours of operation,  please call [phone].   As a reminder, please drink plenty of water prior to coming for your lab work. Thanks!   Vaccines You are taking a medication(s) that can suppress your immune system.  The following immunizations are recommended: Flu annually Covid-19  Td/Tdap (tetanus, diphtheria, pertussis) every 10 years Pneumonia (Prevnar 15 then Pneumovax 23 at least 1 year apart.  Alternatively, can take Prevnar 20 without needing additional dose) Shingrix: 2 doses from 4 weeks to 6 months apart  Please check with your PCP to make sure you are up to date.
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
## 2021-11-29 ENCOUNTER — Other Ambulatory Visit: Payer: Self-pay | Admitting: *Deleted

## 2021-11-29 DIAGNOSIS — M7918 Myalgia, other site: Secondary | ICD-10-CM

## 2021-12-04 LAB — GLIADIN ANTIBODIES, SERUM
Gliadin IgA: <1 U/mL
Gliadin IgG: <1 U/mL

## 2021-12-04 LAB — TISSUE TRANSGLUTAMINASE, IGA: (tTG) Ab, IgA: <1 U/mL

## 2021-12-04 LAB — 14-3-3 ETA PROTEIN: 14-3-3 eta Protein: <0.2 ng/mL (ref <0.2)

## 2021-12-05 NOTE — Progress Notes (Signed)
Gliadin antibodies and tTg antibodies negative.  14-3-3 eta negative.

## 2021-12-07 ENCOUNTER — Other Ambulatory Visit (HOSPITAL_COMMUNITY): Payer: Self-pay

## 2021-12-11 ENCOUNTER — Ambulatory Visit: Payer: 59 | Admitting: Internal Medicine

## 2021-12-15 ENCOUNTER — Other Ambulatory Visit (HOSPITAL_COMMUNITY): Payer: Self-pay

## 2021-12-15 ENCOUNTER — Other Ambulatory Visit: Payer: Self-pay | Admitting: Dermatology

## 2021-12-17 ENCOUNTER — Other Ambulatory Visit (HOSPITAL_COMMUNITY): Payer: Self-pay

## 2021-12-17 MED ORDER — VALACYCLOVIR HCL 1 G PO TABS
ORAL_TABLET | ORAL | 0 refills | Status: DC
  Filled 2021-12-17: qty 30, 7d supply, fill #0

## 2021-12-18 ENCOUNTER — Other Ambulatory Visit (HOSPITAL_COMMUNITY): Payer: Self-pay

## 2021-12-20 ENCOUNTER — Ambulatory Visit: Payer: 59 | Admitting: Dermatology

## 2021-12-24 ENCOUNTER — Other Ambulatory Visit (HOSPITAL_COMMUNITY): Payer: Self-pay

## 2021-12-24 MED ORDER — LISDEXAMFETAMINE DIMESYLATE 30 MG PO CAPS
ORAL_CAPSULE | ORAL | 0 refills | Status: DC
  Filled 2021-12-24: qty 30, 30d supply, fill #0

## 2021-12-24 MED ORDER — NYSTATIN 100000 UNIT/ML MT SUSP
OROMUCOSAL | 1 refills | Status: DC
  Filled 2021-12-24: qty 200, 10d supply, fill #0

## 2021-12-25 ENCOUNTER — Encounter: Payer: Self-pay | Admitting: Cardiology

## 2021-12-25 ENCOUNTER — Ambulatory Visit: Payer: 59 | Admitting: Cardiology

## 2021-12-25 VITALS — BP 120/80 | HR 101 | Ht 67.0 in | Wt 213.0 lb

## 2021-12-25 DIAGNOSIS — R000 Tachycardia, unspecified: Secondary | ICD-10-CM

## 2021-12-25 DIAGNOSIS — F909 Attention-deficit hyperactivity disorder, unspecified type: Secondary | ICD-10-CM | POA: Diagnosis not present

## 2021-12-25 DIAGNOSIS — R9431 Abnormal electrocardiogram [ECG] [EKG]: Secondary | ICD-10-CM | POA: Diagnosis not present
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
## 2021-12-25 NOTE — Patient Instructions (Signed)
Medication Instructions:  Your physician recommends that you continue on your current medications as directed. Please refer to the Current Medication list given to you today.  *If you need a refill on your cardiac medications before your next appointment, please call your pharmacy*  Lab Work: TODAY: T4 If you have labs (blood work) drawn today and your tests are completely normal, you will receive your results only by: [place] (if you have MyChart) OR A paper copy in the mail If you have any lab test that is abnormal or we need to change your treatment, we will call you to review the results.  Testing/Procedures: Your physician has requested that you have an echocardiogram. Echocardiography is a painless test that uses sound waves to create images of your heart. It provides your doctor with information about the size and shape of your heart and how well your heart's chambers and valves are working. This procedure takes approximately one hour. There are no restrictions for this procedure.  Follow-Up: At [facility], you and your health needs are our priority.  As part of our continuing mission to provide you with exceptional heart care, we have created designated Provider Care Teams.  These Care Teams include your primary Cardiologist (physician) and Advanced Practice Providers (APPs -  Physician Assistants and Nurse Practitioners) who all work together to provide you with the care you need, when you need it.  Follow up with Dr. Marlou Porch as needed based on results of testing.   Important Information About Sugar

## 2021-12-26 LAB — T4, FREE: Free T4: 0.83 ng/dL (ref 0.82–1.77)

## 2021-12-27 ENCOUNTER — Other Ambulatory Visit (HOSPITAL_COMMUNITY): Payer: Self-pay

## 2022-01-01 ENCOUNTER — Ambulatory Visit: Payer: 59 | Admitting: Rheumatology

## 2022-01-06 ENCOUNTER — Other Ambulatory Visit (HOSPITAL_COMMUNITY): Payer: Self-pay

## 2022-01-06 ENCOUNTER — Encounter: Payer: Self-pay | Admitting: Internal Medicine

## 2022-01-07 ENCOUNTER — Other Ambulatory Visit (HOSPITAL_COMMUNITY): Payer: Self-pay

## 2022-01-07 MED ORDER — GABAPENTIN 600 MG PO TABS
600.0000 mg | ORAL_TABLET | Freq: Three times a day (TID) | ORAL | 0 refills | Status: DC
  Filled 2022-01-07: qty 60, 20d supply, fill #0

## 2022-01-08 ENCOUNTER — Ambulatory Visit (HOSPITAL_COMMUNITY): Payer: 59 | Attending: Cardiology

## 2022-01-08 DIAGNOSIS — R9431 Abnormal electrocardiogram [ECG] [EKG]: Secondary | ICD-10-CM | POA: Insufficient documentation

## 2022-01-08 LAB — ECHOCARDIOGRAM COMPLETE
Area-P 1/2: 5.14 cm2
S' Lateral: 2.8 cm

## 2022-01-09 NOTE — Progress Notes (Signed)
Normal pump function Normal left atrial size Reassuring echo Candee Furbish, MD

## 2022-01-10 ENCOUNTER — Other Ambulatory Visit: Payer: Self-pay | Admitting: Family

## 2022-01-10 DIAGNOSIS — F329 Major depressive disorder, single episode, unspecified: Secondary | ICD-10-CM | POA: Diagnosis not present

## 2022-01-10 DIAGNOSIS — F919 Conduct disorder, unspecified: Secondary | ICD-10-CM | POA: Diagnosis not present

## 2022-01-10 DIAGNOSIS — F4000 Agoraphobia, unspecified: Secondary | ICD-10-CM | POA: Diagnosis not present

## 2022-01-10 NOTE — Telephone Encounter (Signed)
Xray has been ordered to be done at [facility] but I do not see any lab orders in the chart and not sure what labs are needed.

## 2022-01-11 ENCOUNTER — Other Ambulatory Visit: Payer: Self-pay

## 2022-01-11 ENCOUNTER — Other Ambulatory Visit (HOSPITAL_COMMUNITY): Payer: Self-pay

## 2022-01-11 DIAGNOSIS — R5383 Other fatigue: Secondary | ICD-10-CM

## 2022-01-11 DIAGNOSIS — E781 Pure hyperglyceridemia: Secondary | ICD-10-CM

## 2022-01-11 MED ORDER — ZOLPIDEM TARTRATE ER 12.5 MG PO TBCR
EXTENDED_RELEASE_TABLET | ORAL | 3 refills | Status: DC
  Filled 2022-01-11: qty 30, 30d supply, fill #0
  Filled 2022-02-11: qty 30, 30d supply, fill #1
  Filled 2022-03-12: qty 30, 30d supply, fill #2
  Filled 2022-04-10: qty 30, 30d supply, fill #3

## 2022-01-23 ENCOUNTER — Other Ambulatory Visit (HOSPITAL_COMMUNITY): Payer: Self-pay

## 2022-01-23 MED ORDER — LISDEXAMFETAMINE DIMESYLATE 30 MG PO CAPS
ORAL_CAPSULE | ORAL | 0 refills | Status: DC
  Filled 2022-01-23: qty 30, 30d supply, fill #0

## 2022-01-24 ENCOUNTER — Other Ambulatory Visit (HOSPITAL_COMMUNITY): Payer: Self-pay

## 2022-01-26 ENCOUNTER — Other Ambulatory Visit (HOSPITAL_COMMUNITY): Payer: Self-pay

## 2022-01-29 ENCOUNTER — Other Ambulatory Visit (HOSPITAL_COMMUNITY): Payer: Self-pay

## 2022-01-29 MED ORDER — GABAPENTIN 600 MG PO TABS
600.0000 mg | ORAL_TABLET | Freq: Three times a day (TID) | ORAL | 0 refills | Status: DC
  Filled 2022-01-29: qty 60, 20d supply, fill #0

## 2022-01-31 ENCOUNTER — Ambulatory Visit: Payer: 59 | Admitting: Dermatology

## 2022-02-09 ENCOUNTER — Ambulatory Visit
Admission: EM | Admit: 2022-02-09 | Discharge: 2022-02-09 | Disposition: A | Discharge status: Home / Self Care | Payer: 59 | Attending: Family Medicine | Admitting: Family Medicine

## 2022-02-09 ENCOUNTER — Ambulatory Visit (INDEPENDENT_AMBULATORY_CARE_PROVIDER_SITE_OTHER): Payer: 59

## 2022-02-09 DIAGNOSIS — R319 Hematuria, unspecified: Secondary | ICD-10-CM

## 2022-02-09 DIAGNOSIS — R140 Abdominal distension (gaseous): Secondary | ICD-10-CM | POA: Diagnosis not present

## 2022-02-09 DIAGNOSIS — R109 Unspecified abdominal pain: Secondary | ICD-10-CM | POA: Diagnosis not present

## 2022-02-09 LAB — POCT URINALYSIS DIP (MANUAL ENTRY)
Bilirubin, UA: NEGATIVE
Glucose, UA: NEGATIVE mg/dL
Nitrite, UA: NEGATIVE
Protein Ur, POC: NEGATIVE mg/dL
Spec Grav, UA: 1.015 (ref 1.010–1.025)
Urobilinogen, UA: 0.2 E.U./dL
pH, UA: 5.5 (ref 5.0–8.0)

## 2022-02-09 LAB — POCT URINE PREGNANCY: Preg Test, Ur: NEGATIVE

## 2022-02-09 MED ORDER — SULFAMETHOXAZOLE-TRIMETHOPRIM 800-160 MG PO TABS: 1.0000 | ORAL_TABLET | Freq: Two times a day (BID) | ORAL | 0 refills | Status: AC

## 2022-02-09 MED ORDER — ONDANSETRON 4 MG PO TBDP: 4.0000 mg | ORAL_TABLET | Freq: Three times a day (TID) | ORAL | 0 refills | Status: DC | PRN

## 2022-02-09 NOTE — ED Triage Notes (Signed)
Pt reports on and off abdominal cramps x 1 week; blood in urine since last night.

## 2022-02-10 LAB — URINE CULTURE: Culture: NO GROWTH

## 2022-02-11 ENCOUNTER — Other Ambulatory Visit: Payer: Self-pay | Admitting: Internal Medicine

## 2022-02-11 ENCOUNTER — Other Ambulatory Visit (HOSPITAL_COMMUNITY): Payer: Self-pay

## 2022-02-11 ENCOUNTER — Encounter: Payer: Self-pay | Admitting: Internal Medicine

## 2022-02-11 MED ORDER — TRAMADOL HCL 50 MG PO TABS
50.0000 mg | ORAL_TABLET | Freq: Three times a day (TID) | ORAL | 5 refills | Status: DC | PRN
  Filled 2022-02-11: qty 90, 30d supply, fill #0
  Filled 2022-03-28: qty 90, 30d supply, fill #1
  Filled 2022-05-22: qty 90, 30d supply, fill #2
  Filled 2022-06-29: qty 90, 30d supply, fill #3
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
## 2022-02-14 ENCOUNTER — Other Ambulatory Visit (HOSPITAL_COMMUNITY): Payer: Self-pay

## 2022-02-14 ENCOUNTER — Ambulatory Visit: Payer: 59 | Admitting: Dermatology

## 2022-02-14 DIAGNOSIS — M2550 Pain in unspecified joint: Secondary | ICD-10-CM

## 2022-02-14 DIAGNOSIS — L700 Acne vulgaris: Secondary | ICD-10-CM | POA: Diagnosis not present

## 2022-02-14 DIAGNOSIS — L209 Atopic dermatitis, unspecified: Secondary | ICD-10-CM

## 2022-02-14 DIAGNOSIS — R319 Hematuria, unspecified: Secondary | ICD-10-CM | POA: Diagnosis not present

## 2022-02-14 DIAGNOSIS — N200 Calculus of kidney: Secondary | ICD-10-CM | POA: Diagnosis not present

## 2022-02-14 DIAGNOSIS — N871 Moderate cervical dysplasia: Secondary | ICD-10-CM | POA: Diagnosis not present

## 2022-02-14 MED ORDER — CLINDAMYCIN PHOSPHATE 1 % EX SOLN
CUTANEOUS | 3 refills | Status: DC
  Filled 2022-02-14: qty 60, 30d supply, fill #0
  Filled 2022-04-29: qty 60, 30d supply, fill #1
  Filled 2022-07-29: qty 60, 30d supply, fill #2
  Filled 2022-08-21: qty 60, 30d supply, fill #3

## 2022-02-14 MED ORDER — TRETINOIN 0.025 % EX CREA
TOPICAL_CREAM | CUTANEOUS | 3 refills | Status: DC
  Filled 2022-02-14: qty 45, 30d supply, fill #0
  Filled 2022-04-29: qty 45, 30d supply, fill #1
  Filled 2022-06-29: qty 45, 30d supply, fill #2
  Filled 2022-08-21: qty 45, 30d supply, fill #3

## 2022-02-14 MED ORDER — TAMSULOSIN HCL 0.4 MG PO CAPS
0.4000 mg | ORAL_CAPSULE | Freq: Every day | ORAL | 1 refills | Status: DC
Start: 2022-02-14 — End: 2022-03-05
  Filled 2022-02-14: qty 30, 30d supply, fill #0

## 2022-02-14 NOTE — Patient Instructions (Signed)
Due to recent changes in healthcare laws, you may see results of your pathology and/or laboratory studies on MyChart before the doctors have had a chance to review them. We understand that in some cases there may be results that are confusing or concerning to you. Please understand that not all results are received at the same time and often the doctors may need to interpret multiple results in order to provide you with the best plan of care or course of treatment. Therefore, we ask that you please give us 2 business days to thoroughly review all your results before contacting the office for clarification. Should we see a critical lab result, you will be contacted sooner.   If You Need Anything After Your Visit  If you have any questions or concerns for your doctor, please call our main line at [phone] and press option 4 to reach your doctor's medical assistant. If no one answers, please leave a voicemail as directed and we will return your call as soon as possible. Messages left after 4 pm will be answered the following business day.   You may also send us a message via MyChart. We typically respond to MyChart messages within 1-2 business days.  For prescription refills, please ask your pharmacy to contact our office. Our fax number is [phone].  If you have an urgent issue when the clinic is closed that cannot wait until the next business day, you can page your doctor at the number below.    Please note that while we do our best to be available for urgent issues outside of office hours, we are not available 24/7.   If you have an urgent issue and are unable to reach us, you may choose to seek medical care at your doctor's office, retail clinic, urgent care center, or emergency room.  If you have a medical emergency, please immediately call 911 or go to the emergency department.  Pager Numbers  - Dr. Kowalski: [phone]  - Dr. Moye: [phone]  - Dr. Stewart:  [phone]  In the event of inclement weather, please call our main line at [phone] for an update on the status of any delays or closures.  Dermatology Medication Tips: Please keep the boxes that topical medications come in in order to help keep track of the instructions about where and how to use these. Pharmacies typically print the medication instructions only on the boxes and not directly on the medication tubes.   If your medication is too expensive, please contact our office at [phone] option 4 or send us a message through MyChart.   We are unable to tell what your co-pay for medications will be in advance as this is different depending on your insurance coverage. However, we may be able to find a substitute medication at lower cost or fill out paperwork to get insurance to cover a needed medication.   If a prior authorization is required to get your medication covered by your insurance company, please allow us 1-2 business days to complete this process.  Drug prices often vary depending on where the prescription is filled and some pharmacies may offer cheaper prices.  The website www.goodrx.com contains coupons for medications through different pharmacies. The prices here do not account for what the cost may be with help from insurance (it may be cheaper with your insurance), but the website can give you the price if you did not use any insurance.  - You can print the associated coupon and take it with   your prescription to the pharmacy.  - You may also stop by our office during regular business hours and pick up a GoodRx coupon card.  - If you need your prescription sent electronically to a different pharmacy, notify our office through [place] MyChart or by phone at [phone] option 4.     Si Usted Necesita Algo Despus de Su Visita  Tambin puede enviarnos un mensaje a travs de MyChart. Por lo general respondemos a los mensajes de MyChart en el transcurso de 1 a 2  das hbiles.  Para renovar recetas, por favor pida a su farmacia que se ponga en contacto con nuestra oficina. Nuestro nmero de fax es el [phone].  Si tiene un asunto urgente cuando la clnica est cerrada y que no puede esperar hasta el siguiente da hbil, puede llamar/localizar a su doctor(a) al nmero que aparece a continuacin.   Por favor, tenga en cuenta que aunque hacemos todo lo posible para estar disponibles para asuntos urgentes fuera del horario de oficina, no estamos disponibles las 24 horas del da, los 7 das de la semana.   Si tiene un problema urgente y no puede comunicarse con nosotros, puede optar por buscar atencin mdica  en el consultorio de su doctor(a), en una clnica privada, en un centro de atencin urgente o en una sala de emergencias.  Si tiene una emergencia mdica, por favor llame inmediatamente al 911 o vaya a la sala de emergencias.  Nmeros de bper  - Dr. Kowalski: [phone]  - Dra. Moye: [phone]  - Dra. Stewart: [phone]  En caso de inclemencias del tiempo, por favor llame a nuestra lnea principal al [phone] para una actualizacin sobre el estado de cualquier retraso o cierre.  Consejos para la medicacin en dermatologa: Por favor, guarde las cajas en las que vienen los medicamentos de uso tpico para ayudarle a seguir las instrucciones sobre dnde y cmo usarlos. Las farmacias generalmente imprimen las instrucciones del medicamento slo en las cajas y no directamente en los tubos del medicamento.   Si su medicamento es muy caro, por favor, pngase en contacto con nuestra oficina llamando al [phone] y presione la opcin 4 o envenos un mensaje a travs de MyChart.   No podemos decirle cul ser su copago por los medicamentos por adelantado ya que esto es diferente dependiendo de la cobertura de su seguro. Sin embargo, es posible que podamos encontrar un medicamento sustituto a menor costo o llenar un formulario para que el  seguro cubra el medicamento que se considera necesario.   Si se requiere una autorizacin previa para que su compaa de seguros cubra su medicamento, por favor permtanos de 1 a 2 das hbiles para completar este proceso.  Los precios de los medicamentos varan con frecuencia dependiendo del lugar de dnde se surte la receta y alguna farmacias pueden ofrecer precios ms baratos.  El sitio web www.goodrx.com tiene cupones para medicamentos de diferentes farmacias. Los precios aqu no tienen en cuenta lo que podra costar con la ayuda del seguro (puede ser ms barato con su seguro), pero el sitio web puede darle el precio si no utiliz ningn seguro.  - Puede imprimir el cupn correspondiente y llevarlo con su receta a la farmacia.  - Tambin puede pasar por nuestra oficina durante el horario de atencin regular y recoger una tarjeta de cupones de GoodRx.  - Si necesita que su receta se enve electrnicamente a una farmacia diferente, informe a nuestra oficina a travs de MyChart de [place]   o por telfono llamando al [phone] y presione la opcin 4.  

## 2022-02-14 NOTE — Progress Notes (Unsigned)
   Follow-Up Visit   Subjective  Sonia Holden is a 36 y.o. female who presents for the following: Acne (Patient lost her prescription for Dapsone but recently found it and started using it again. Her acne continues to flare. She was not aware she was supposed to be using Ziana.) and Eczema (Patient continues to have outbreaks on the chest and arms. She uses Mometasone 0.1% cream as needed and broke out last week all over. She is hesitant to use the Mometasone cream all over. ). Patient continues to have joint pain, stiffness, and swelling. Currently being followed by rheumatology who did labs and found positive ANA  and anti-CCP.    The following portions of the chart were reviewed this encounter and updated as appropriate:   Tobacco  Allergies  Meds  Problems  Med Hx  Surg Hx  Fam Hx      Review of Systems:  No other skin or systemic complaints except as noted in HPI or Assessment and Plan.  Objective  Well appearing patient in no apparent distress; mood and affect are within normal limits.  A focused examination was performed including the face, trunk, and extremities. Relevant physical exam findings are noted in the Assessment and Plan.  Face Trace open comedones scattered inflammatory papules at face.  Trunk, extremities Scaly pink papules coalescing to plaques at arms.      Assessment & Plan  Acne vulgaris Face  Chronic and persistent condition with duration or expected duration over one year. Condition is bothersome/symptomatic for patient. Currently flared.  Start Tretinoin 0.025% cream QHS and Clindamycin solution QAM.   Continue Aczone 7.5% gel QD.  tretinoin (RETIN-A) 0.025 % cream - Face Apply a pea sized amount to the entire face at bedtime  clindamycin (CLEOCIN T) 1 % external solution - Face Apply a thin coat to the entire face every morning  Related Medications Dapsone (ACZONE) 7.5 % GEL Apply to face once every morning  Atopic dermatitis,  unspecified type Trunk, extremities  Not suggestive of lupus today. More consistent with eczema.   Continue Dove sensitive skin soaps. Recommend Free and Clear laundry detergent.   Start Mometasone 0.1% cream to aa's BID PRN for up to 2 weeks. If not improving in two weeks or if rash changes patient to contact our office. May consider biopsy of not clearing well with topical steroids.  Topical steroids (such as triamcinolone, fluocinolone, fluocinonide, mometasone, clobetasol, halobetasol, betamethasone, hydrocortisone) can cause thinning and lightening of the skin if they are used for too long in the same area. Your physician has selected the right strength medicine for your problem and area affected on the body. Please use your medication only as directed by your physician to prevent side effects.    Hematuria, unspecified type Urinary system  Patient to follow up with PCP.   Arthralgia, unspecified joint B/L hands, shoulders  Patient c/o joint pain, stiffness, and swelling -   Patient being followed by rheumatology and will continue care with them.    Return in about 3 months (around 05/16/2022).  Luther Redo, CMA, am acting as scribe for Forest Gleason, MD .  Documentation: I have reviewed the above documentation for accuracy and completeness, and I agree with the above.  Forest Gleason, MD

## 2022-02-15 ENCOUNTER — Other Ambulatory Visit: Payer: Self-pay | Admitting: Obstetrics and Gynecology

## 2022-02-15 ENCOUNTER — Other Ambulatory Visit (HOSPITAL_COMMUNITY): Payer: Self-pay

## 2022-02-15 ENCOUNTER — Encounter: Payer: Self-pay | Admitting: Dermatology

## 2022-02-15 DIAGNOSIS — Z803 Family history of malignant neoplasm of breast: Secondary | ICD-10-CM
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
## 2022-02-19 ENCOUNTER — Other Ambulatory Visit: Payer: Self-pay | Admitting: Obstetrics and Gynecology

## 2022-02-19 DIAGNOSIS — N200 Calculus of kidney: Secondary | ICD-10-CM

## 2022-02-20 ENCOUNTER — Ambulatory Visit
Admission: RE | Admit: 2022-02-20 | Discharge: 2022-02-20 | Disposition: A | Discharge status: Home / Self Care | Payer: 59 | Source: Ambulatory Visit | Attending: Obstetrics and Gynecology | Admitting: Obstetrics and Gynecology

## 2022-02-20 DIAGNOSIS — N200 Calculus of kidney: Secondary | ICD-10-CM

## 2022-02-20 DIAGNOSIS — R319 Hematuria, unspecified: Secondary | ICD-10-CM | POA: Diagnosis not present

## 2022-02-20 DIAGNOSIS — R109 Unspecified abdominal pain: Secondary | ICD-10-CM | POA: Diagnosis not present

## 2022-02-25 ENCOUNTER — Other Ambulatory Visit (HOSPITAL_COMMUNITY): Payer: Self-pay

## 2022-02-26 ENCOUNTER — Other Ambulatory Visit (HOSPITAL_COMMUNITY): Payer: Self-pay

## 2022-02-28 ENCOUNTER — Emergency Department (HOSPITAL_COMMUNITY): Payer: 59

## 2022-02-28 ENCOUNTER — Encounter (HOSPITAL_COMMUNITY): Payer: Self-pay

## 2022-02-28 ENCOUNTER — Encounter: Payer: Self-pay | Admitting: Physician Assistant

## 2022-02-28 ENCOUNTER — Emergency Department (HOSPITAL_COMMUNITY)
Admission: EM | Admit: 2022-02-28 | Discharge: 2022-03-01 | Disposition: A | Discharge status: Home / Self Care | Payer: 59 | Attending: Emergency Medicine | Admitting: Emergency Medicine

## 2022-02-28 ENCOUNTER — Ambulatory Visit: Payer: 59 | Attending: Physician Assistant | Admitting: Physician Assistant

## 2022-02-28 ENCOUNTER — Other Ambulatory Visit: Payer: Self-pay

## 2022-02-28 VITALS — BP 145/100 | HR 106 | Resp 17 | Ht 67.0 in | Wt 216.6 lb

## 2022-02-28 DIAGNOSIS — R768 Other specified abnormal immunological findings in serum: Secondary | ICD-10-CM | POA: Diagnosis not present

## 2022-02-28 DIAGNOSIS — M79641 Pain in right hand: Secondary | ICD-10-CM | POA: Diagnosis not present

## 2022-02-28 DIAGNOSIS — Z79899 Other long term (current) drug therapy: Secondary | ICD-10-CM | POA: Diagnosis not present

## 2022-02-28 DIAGNOSIS — S83511A Sprain of anterior cruciate ligament of right knee, initial encounter: Secondary | ICD-10-CM | POA: Diagnosis not present

## 2022-02-28 DIAGNOSIS — N201 Calculus of ureter: Secondary | ICD-10-CM | POA: Diagnosis not present

## 2022-02-28 DIAGNOSIS — M79642 Pain in left hand: Secondary | ICD-10-CM

## 2022-02-28 DIAGNOSIS — K5909 Other constipation: Secondary | ICD-10-CM

## 2022-02-28 DIAGNOSIS — Z803 Family history of malignant neoplasm of breast: Secondary | ICD-10-CM

## 2022-02-28 DIAGNOSIS — N132 Hydronephrosis with renal and ureteral calculous obstruction: Secondary | ICD-10-CM | POA: Diagnosis not present

## 2022-02-28 DIAGNOSIS — R109 Unspecified abdominal pain: Secondary | ICD-10-CM | POA: Diagnosis present

## 2022-02-28 DIAGNOSIS — M1711 Unilateral primary osteoarthritis, right knee: Secondary | ICD-10-CM | POA: Diagnosis not present

## 2022-02-28 DIAGNOSIS — F419 Anxiety disorder, unspecified: Secondary | ICD-10-CM

## 2022-02-28 DIAGNOSIS — M23611 Other spontaneous disruption of anterior cruciate ligament of right knee: Secondary | ICD-10-CM

## 2022-02-28 DIAGNOSIS — D72829 Elevated white blood cell count, unspecified: Secondary | ICD-10-CM | POA: Diagnosis not present

## 2022-02-28 DIAGNOSIS — R002 Palpitations: Secondary | ICD-10-CM

## 2022-02-28 DIAGNOSIS — N200 Calculus of kidney: Secondary | ICD-10-CM

## 2022-02-28 DIAGNOSIS — M545 Low back pain, unspecified: Secondary | ICD-10-CM

## 2022-02-28 DIAGNOSIS — N1330 Unspecified hydronephrosis: Secondary | ICD-10-CM | POA: Diagnosis not present

## 2022-02-28 DIAGNOSIS — M7918 Myalgia, other site: Secondary | ICD-10-CM

## 2022-02-28 DIAGNOSIS — F5105 Insomnia due to other mental disorder: Secondary | ICD-10-CM

## 2022-02-28 DIAGNOSIS — N839 Noninflammatory disorder of ovary, fallopian tube and broad ligament, unspecified: Secondary | ICD-10-CM | POA: Diagnosis not present

## 2022-02-28 DIAGNOSIS — M25511 Pain in right shoulder: Secondary | ICD-10-CM

## 2022-02-28 DIAGNOSIS — M25512 Pain in left shoulder: Secondary | ICD-10-CM

## 2022-02-28 DIAGNOSIS — G8929 Other chronic pain: Secondary | ICD-10-CM

## 2022-02-28 LAB — URINALYSIS, ROUTINE W REFLEX MICROSCOPIC
Bilirubin Urine: NEGATIVE
Glucose, UA: NEGATIVE mg/dL
Ketones, ur: 20 mg/dL — AB
Nitrite: NEGATIVE
Protein, ur: 30 mg/dL — AB
Specific Gravity, Urine: 1.029 (ref 1.005–1.030)
pH: 5 (ref 5.0–8.0)

## 2022-02-28 LAB — CBC WITH DIFFERENTIAL/PLATELET
Abs Immature Granulocytes: 0.04 10*3/uL (ref 0.00–0.07)
Basophils Absolute: 0.1 10*3/uL (ref 0.0–0.1)
Basophils Relative: 0 %
Eosinophils Absolute: 0.1 10*3/uL (ref 0.0–0.5)
Eosinophils Relative: 1 %
HCT: 44.3 % (ref 36.0–46.0)
Hemoglobin: 14.7 g/dL (ref 12.0–15.0)
Immature Granulocytes: 0 %
Lymphocytes Relative: 17 %
Lymphs Abs: 2.4 10*3/uL (ref 0.7–4.0)
MCH: 29.9 pg (ref 26.0–34.0)
MCHC: 33.2 g/dL (ref 30.0–36.0)
MCV: 90 fL (ref 80.0–100.0)
Monocytes Absolute: 0.8 10*3/uL (ref 0.1–1.0)
Monocytes Relative: 6 %
Neutro Abs: 11.2 10*3/uL — ABNORMAL HIGH (ref 1.7–7.7)
Neutrophils Relative %: 76 %
Platelets: 279 10*3/uL (ref 150–400)
RBC: 4.92 MIL/uL (ref 3.87–5.11)
RDW: 12.6 % (ref 11.5–15.5)
WBC: 14.7 10*3/uL — ABNORMAL HIGH (ref 4.0–10.5)
nRBC: 0 % (ref 0.0–0.2)

## 2022-02-28 LAB — COMPREHENSIVE METABOLIC PANEL
ALT: 26 U/L (ref 0–44)
AST: 21 U/L (ref 15–41)
Albumin: 4.1 g/dL (ref 3.5–5.0)
Alkaline Phosphatase: 47 U/L (ref 38–126)
Anion gap: 10 (ref 5–15)
BUN: 15 mg/dL (ref 6–20)
CO2: 20 mmol/L — ABNORMAL LOW (ref 22–32)
Calcium: 8.8 mg/dL — ABNORMAL LOW (ref 8.9–10.3)
Chloride: 102 mmol/L (ref 98–111)
Creatinine, Ser: 0.81 mg/dL (ref 0.44–1.00)
GFR, Estimated: >60 mL/min (ref >60)
Glucose, Bld: 95 mg/dL (ref 70–99)
Potassium: 3.9 mmol/L (ref 3.5–5.1)
Sodium: 132 mmol/L — ABNORMAL LOW (ref 135–145)
Total Bilirubin: 0.7 mg/dL (ref 0.3–1.2)
Total Protein: 7.3 g/dL (ref 6.5–8.1)

## 2022-02-28 LAB — PREGNANCY, URINE: Preg Test, Ur: NEGATIVE

## 2022-02-28 MED ORDER — CIPROFLOXACIN HCL 250 MG PO TABS
500.0000 mg | ORAL_TABLET | Freq: Once | ORAL | Status: AC
  Administered 2022-03-01: 500 mg via ORAL
  Filled 2022-02-28: qty 2

## 2022-02-28 MED ORDER — CIPROFLOXACIN HCL 500 MG PO TABS: 500.0000 mg | ORAL_TABLET | Freq: Two times a day (BID) | ORAL | 0 refills | Status: AC

## 2022-02-28 MED ORDER — ONDANSETRON HCL 4 MG PO TABS: 4.0000 mg | ORAL_TABLET | Freq: Four times a day (QID) | ORAL | 0 refills | Status: DC

## 2022-02-28 MED ORDER — METOCLOPRAMIDE HCL 5 MG/ML IJ SOLN
5.0000 mg | Freq: Once | INTRAMUSCULAR | Status: AC
  Administered 2022-02-28: 5 mg via INTRAVENOUS
  Filled 2022-02-28: qty 2

## 2022-02-28 MED ORDER — SODIUM CHLORIDE 0.9 % IV BOLUS
1000.0000 mL | Freq: Once | INTRAVENOUS | Status: AC
  Administered 2022-02-28: 1000 mL via INTRAVENOUS

## 2022-02-28 MED ORDER — OXYCODONE-ACETAMINOPHEN 5-325 MG PO TABS: 1.0000 | ORAL_TABLET | Freq: Four times a day (QID) | ORAL | 0 refills | Status: DC | PRN

## 2022-02-28 MED ORDER — TAMSULOSIN HCL 0.4 MG PO CAPS
0.4000 mg | ORAL_CAPSULE | Freq: Every day | ORAL | 0 refills | Status: DC
Start: 2022-02-28 — End: 2022-06-20

## 2022-02-28 MED ORDER — ONDANSETRON HCL 4 MG/2ML IJ SOLN
4.0000 mg | Freq: Once | INTRAMUSCULAR | Status: DC
  Filled 2022-02-28: qty 2

## 2022-02-28 MED ORDER — KETOROLAC TROMETHAMINE 15 MG/ML IJ SOLN
15.0000 mg | Freq: Once | INTRAMUSCULAR | Status: AC
  Administered 2022-02-28: 15 mg via INTRAVENOUS
  Filled 2022-02-28: qty 1

## 2022-02-28 NOTE — ED Provider Notes (Signed)
[facility] EMERGENCY DEPARTMENT Provider Note   CSN: [id number] Arrival date & time: 02/28/22  1646     History  Chief Complaint  Patient presents with   Flank Pain    Sonia Holden is a 36 y.o. female. With past medical history of obesity, hypertriglyceridemia, who presents to the emergency department with flank pain.  States she is having flank pain from known kidney stone. States that symptoms may have started almost 1 month ago. States she began having intermittent hematuria. She then began having flank pain around 02/20/22 and called her OB who put in CT scan. She was found to have 59m left stone without hydronephrosis. She was given tamsulosin but states symptoms are not improving. States today she had an onset of worsening left flank pain and nausea. She denies vomiting. States flank pain comes in waves and at times makes her diaphoretic. Pain radiates to the left lower quadrant. She notes she has had decreased urine production today and difficulty tolerating PO.    Flank Pain       Home Medications Prior to Admission medications   Medication Sig Start Date End Date Taking? Authorizing Provider  ciprofloxacin (CIPRO) 500 MG tablet Take 1 tablet (500 mg total) by mouth every 12 (twelve) hours for 7 days. 02/28/22 03/07/22 Yes AMickie Hillier PA-C  ondansetron (ZOFRAN) 4 MG tablet Take 1 tablet (4 mg total) by mouth every 6 (six) hours. 02/28/22  Yes AMickie Hillier PA-C  oxyCODONE-acetaminophen (PERCOCET/ROXICET) 5-325 MG tablet Take 1 tablet by mouth every 6 (six) hours as needed for severe pain. Patient not taking: Reported on 03/05/2022 02/28/22  Yes AMickie Hillier PA-C  tamsulosin (FLOMAX) 0.4 MG CAPS capsule Take 1 capsule (0.4 mg total) by mouth daily after supper. 02/28/22  Yes AMickie Hillier PA-C  ALPRAZolam (Duanne Moron 0.5 MG tablet Take 1 tablet by mouth daily as needed for anxiety 11/13/21     ASPIRIN 81 PO Take by mouth. Patient not taking: Reported on  03/05/2022    [provider]  clindamycin (CLEOCIN T) 1 % external solution Apply a thin coat to the entire face every morning 02/14/22   Moye, [place] MD  Dapsone (ACZONE) 7.5 % GEL Apply to face once every morning 10/18/21   KRalene Bathe MD  doxepin (SINEQUAN) 50 MG capsule Take 1-2 capsules (50-100 mg total) by mouth at bedtime. 11/13/21     famotidine (PEPCID) 20 MG tablet Take 1 tablet by mouth 2 times a day Patient taking differently: Take 20 mg by mouth as needed. 11/02/21   TCrecencio Mc MD  gabapentin (NEURONTIN) 600 MG tablet Take 1 tablet by mouth 3 times daily. 01/28/22     hydroxychloroquine (PLAQUENIL) 200 MG tablet Take 1 tablet (200 mg total) by mouth 2 (two) times daily Monday-Friday. Patient not taking: Reported on 03/05/2022 03/05/22   DOfilia Neas PA-C  ibuprofen (ADVIL) 800 MG tablet Take 1 tablet by mouth every 8 hours as needed. Patient not taking: Reported on 03/05/2022 10/08/21     levothyroxine (SYNTHROID) 50 MCG tablet Take 1 tablet (50 mcg total) by mouth daily. 03/05/22   TCrecencio Mc MD  lisdexamfetamine (VYVANSE) 30 MG capsule Take 1 capsule by mouth every morning. 12/19/21     mometasone (ELOCON) 0.1 % cream Apply 1 application to affected areas once daily as needed for rash. 10/18/21   KRalene Bathe MD  montelukast (SINGULAIR) 10 MG tablet Take 1 tablet (10 mg total) by mouth  at bedtime. Patient not taking: Reported on 02/28/2022 11/02/21   Crecencio Mc, MD  Multiple Vitamin (MULTIVITAMIN PO) Take by mouth.    [provider]  nystatin (MYCOSTATIN) 100000 UNIT/ML suspension Swish and swallow 5 mls by mouth 4 times a day for 10 days Patient not taking: Reported on 02/14/2022 08/28/21     ondansetron (ZOFRAN-ODT) 4 MG disintegrating tablet Take 1 tablet (4 mg total) by mouth every 8 (eight) hours as needed for nausea or vomiting. 02/09/22   Vanessa Kick, MD  oxyCODONE-acetaminophen (PERCOCET) 7.5-325 MG tablet Take 1-2 tablets by mouth every  4 (four) hours as needed. 03/04/22     polyethylene glycol (GOLYTELY) 236 g solution Drink 8 ounces every 30 minutes until constipation is relieved 03/05/22   Crecencio Mc, MD  promethazine (PHENERGAN) 12.5 MG tablet Take 1 tablet (12.5 mg total) by mouth every 4 (four) hours as needed. 03/04/22     traMADol (ULTRAM) 50 MG tablet Take 1 tablet by mouth every 8 (eight) hours as needed for chronic back pain 02/11/22 07/26/23  Dutch Quint B, FNP  tretinoin (RETIN-A) 0.025 % cream Apply a pea sized amount to the entire face at bedtime 02/14/22   Moye, [place], MD  valACYclovir (VALTREX) 1000 MG tablet TAKE 2 TABLETS BY MOUTH AT ONSET OF SYMPTOMS OF COLD SORES THEN 2 TABLETS 12 HOURS LATER AS DIRECTED. 12/17/21 12/17/22  Ralene Bathe, MD  zolpidem (AMBIEN CR) 12.5 MG CR tablet Take 1 tablet by mouth every day at bedtime as needed for sleep 01/11/22         Allergies    Cephalexin, Doxycycline, Tape, Trazodone, Zyrtec [cetirizine], and Lidocaine    Review of Systems   Review of Systems  Constitutional:  Negative for fever.  Gastrointestinal:  Positive for nausea. Negative for vomiting.  Genitourinary:  Positive for decreased urine volume, difficulty urinating, flank pain and hematuria.  All other systems reviewed and are negative.   Physical Exam Updated Vital Signs BP (!) 129/93   Pulse (!) 105   Temp 98.5 F (36.9 C) (Oral)   Resp 18   Ht '5\' 7"'$  (1.702 m)   Wt 98 kg   LMP 02/20/2022   SpO2 98%   BMI 33.83 kg/m  Physical Exam Vitals and nursing note reviewed.  Constitutional:      General: She is not in acute distress.    Appearance: Normal appearance. She is not ill-appearing or toxic-appearing.  HENT:     Head: Normocephalic.     Mouth/Throat:     Mouth: Mucous membranes are moist.     Pharynx: Oropharynx is clear.  Eyes:     General: No scleral icterus.    Extraocular Movements: Extraocular movements intact.  Cardiovascular:     Rate and Rhythm: Normal rate and regular  rhythm.     Pulses: Normal pulses.  Pulmonary:     Effort: Pulmonary effort is normal.     Breath sounds: Normal breath sounds.  Abdominal:     General: Abdomen is protuberant. Bowel sounds are normal. There is no distension.     Palpations: Abdomen is soft.     Tenderness: There is abdominal tenderness in the left lower quadrant. There is left CVA tenderness. There is no right CVA tenderness or guarding. Negative signs include Murphy's sign and McBurney's sign.  Musculoskeletal:     Cervical back: Neck supple.  Skin:    General: Skin is warm and dry.     Capillary Refill: Capillary refill  takes less than 2 seconds.  Neurological:     General: No focal deficit present.     Mental Status: She is alert and oriented to person, place, and time. Mental status is at baseline.  Psychiatric:        Mood and Affect: Mood normal.        Behavior: Behavior normal.        Thought Content: Thought content normal.        Judgment: Judgment normal.    ED Results / Procedures / Treatments   Labs (all labs ordered are listed, but only abnormal results are displayed) Labs Reviewed  URINALYSIS, ROUTINE W REFLEX MICROSCOPIC - Abnormal; Notable for the following components:      Result Value   APPearance CLOUDY (*)    Hgb urine dipstick SMALL (*)    Ketones, ur 20 (*)    Protein, ur 30 (*)    Leukocytes,Ua TRACE (*)    Bacteria, UA MANY (*)    All other components within normal limits  COMPREHENSIVE METABOLIC PANEL - Abnormal; Notable for the following components:   Sodium 132 (*)    CO2 20 (*)    Calcium 8.8 (*)    All other components within normal limits  CBC WITH DIFFERENTIAL/PLATELET - Abnormal; Notable for the following components:   WBC 14.7 (*)    Neutro Abs 11.2 (*)    All other components within normal limits  URINE CULTURE  PREGNANCY, URINE  POC URINE PREG, ED   EKG None  Radiology MR BREAST BILATERAL W WO CONTRAST INC CAD  Result Date: 03/05/2022 CLINICAL DATA:  High  risk screening. Family history of breast carcinoma. EXAM: BILATERAL BREAST MRI WITH AND WITHOUT CONTRAST TECHNIQUE: Multiplanar, multisequence MR images of both breasts were obtained prior to and following the intravenous administration of 10 ml of Vueway. Three-dimensional MR images were rendered by post-processing of the original MR data on an independent workstation. The three-dimensional MR images were interpreted, and findings are reported in the following complete MRI report for this study. Three dimensional images were evaluated at the independent interpreting workstation using the DynaCAD thin client. COMPARISON:  Prior exams including previous breast MRI, 05/12/2020. FINDINGS: Breast composition: b. Scattered fibroglandular tissue. Background parenchymal enhancement: Minimal Right breast: No mass or abnormal enhancement. Left breast: No mass or abnormal enhancement. Lymph nodes: No abnormal appearing lymph nodes. Ancillary findings:  None. IMPRESSION: No MRI evidence of breast malignancy. RECOMMENDATION: 1. Annual screening mammography, last exam performed on 08/14/2021. 2. Supplemental high risk screening breast MRI in 1 year. BI-RADS CATEGORY  1: Negative. Electronically Signed   By: Lajean Manes M.D.   On: 03/05/2022 12:09   Procedures Procedures   Medications Ordered in ED Medications  sodium chloride 0.9 % bolus 1,000 mL (0 mLs Intravenous Stopped 03/01/22 0009)  ketorolac (TORADOL) 15 MG/ML injection 15 mg (15 mg Intravenous Given 02/28/22 2250)  metoCLOPramide (REGLAN) injection 5 mg (5 mg Intravenous Given 02/28/22 2249)  ciprofloxacin (CIPRO) tablet 500 mg (500 mg Oral Given 03/01/22 0005)    ED Course/ Medical Decision Making/ A&P                           Medical Decision Making Amount and/or Complexity of Data Reviewed Labs: ordered. Radiology: ordered.  Risk Prescription drug management.  This patient presents to the ED with chief complaint(s) of flank pain with pertinent  past medical history of hypertriglyceridemia which further complicates the presenting  complaint. The complaint involves an extensive differential diagnosis and also carries with it a high risk of complications and morbidity.    The differential diagnosis includes Acute hepatobiliary disease, pancreatitis, appendicitis, PUD, gastritis, SBO, diverticulitis, colitis, viral gastroenteritis, Crohn's, UC, vascular catastrophe, UTI, pyelonephritis, renal stone, obstructed stone, infected stone, ovarian torsion, ectopic pregnancy, TOA, PID, STD, etc.    Additional history obtained: Additional history obtained from family Records reviewed Care Everywhere/External Records and Primary Care Documents  ED Course and Reassessment: 36 year old female who presents to the emergency department with left-sided flank pain.  She is mildly uncomfortable on exam with left CVA tenderness.  Her abdomen is soft, no peritonitic findings.  Her vitals are stable.  She is nonseptic, nontoxic in appearance.  Labs with mild leukocytosis to 14.7.  There is no AKI, transaminitis, electrolyte derangement.  UA has some leuks but otherwise is not very consistent with UTI.  She does have blood in her urine concerning for stone.  A urine culture was sent. CT renal stone study shows a 6 mm left ureteral stone with hydronephrosis.  Nonobstructive pattern.  This is consistent with her history and exam.  She was given IV fluids, Toradol and Reglan with improvement in her symptoms.  Also gave her a dose of ciprofloxacin.  Do not feel that her history and exam are consistent with other etiologies of abdominal or flank pain such as pneumonia, pancreatitis, acute bout of biliary disease, atypical appendicitis, PUD or gastritis, SBO, diverticulitis, vascular catastrophe, obstructed stone, infected stone, torsion, ectopic, etc. We will send her home with Flomax, Zofran, Cipro and Percocet.  Given that she has leuks in her urine, although not  obvious UTI will cover given that she has a stone.  Sent off a urine culture.  We will have her follow-up with urology in the outpatient setting.  Sent a referral for this and given her the information for them.  Discussed findings with patient and she verbalized understanding.  Given return precautions for worsening symptoms, decreased urine output, fevers.  She verbalized understanding.  Otherwise feel that she is in for discharge at this time.   Independent labs interpretation:  The following labs were independently interpreted: Leukocytosis to 14.7, no AKI, electrolyte derangement, transaminitis.  UA not very consistent with UTI, urine culture sent  Independent visualization of imaging: - I independently visualized the following imaging with scope of interpretation limited to determining acute life threatening conditions related to emergency care: CT renal stone study, which revealed 6 mm proximal left ureteral stone with mild hydronephrosis  Consultation: - Consulted or discussed management/test interpretation w/ external professional: Not indicated  Consideration for admission or further workup: Not indicated Social Determinants of health: None identified Final Clinical Impression(s) / ED Diagnoses Final diagnoses:  Nephrolithiasis    Rx / DC Orders ED Discharge Orders          Ordered    tamsulosin (FLOMAX) 0.4 MG CAPS capsule  Daily after supper        02/28/22 2352    ondansetron (ZOFRAN) 4 MG tablet  Every 6 hours        02/28/22 2352    ciprofloxacin (CIPRO) 500 MG tablet  Every 12 hours        02/28/22 2352    oxyCODONE-acetaminophen (PERCOCET/ROXICET) 5-325 MG tablet  Every 6 hours PRN        02/28/22 2352    Ambulatory referral to Urology        02/28/22 2353  Mickie Hillier, PA-C 03/06/22 [place], MD 03/06/22 1556

## 2022-02-28 NOTE — Discharge Instructions (Addendum)
You were seen in the emergency department today for flank pain.  You do have a kidney stone.  It is not fully obstructed.  I am sending you home with 4 medications including an antibiotic called ciprofloxacin that you will take twice a day for 7 days.  I am also sending you home with Zofran, tamsulosin and Percocet.  You have been prescribed a medication that is considered an opiate. Opiates are pain medications that should be used with caution. It is important that you do not drive while taking this medication as it can cause drowsiness and impaired reaction times. Do not mix this medication with benzodiazepine medications or alcohol as this can cause respiratory depression. Additionally, opiates have addicting properties to them. Please use medication as prescribed by your provider.  Please return to the emergency department for significantly worsening pain with fever, no urine output or inability to keep liquids down.

## 2022-02-28 NOTE — ED Triage Notes (Signed)
Pt presents to ED with complaints of left sided flank pain and nausea from 60m kidney stone.

## 2022-02-28 NOTE — ED Notes (Signed)
Patient transported to CT 

## 2022-02-28 NOTE — Patient Instructions (Signed)
Standing Labs We placed an order today for your standing lab work.   Please have your standing labs drawn in 1 month, 3 months and then every 5 months.   Please have your labs drawn 2 weeks prior to your appointment so that the provider can discuss your lab results at your appointment.  Please note that you may see your imaging and lab results in [place] before we have reviewed them. We will contact you once all results are reviewed. Please allow our office up to 72 hours to thoroughly review all of the results before contacting the office for clarification of your results.  Lab hours are: Monday through Thursday from 1:30 pm-4:30 pm and Friday from 1:30 pm- 4:00 pm  You may experience shorter wait times on Monday, Thursday or Friday afternoons,.   Effective April 01, 2022, new lab hours will be: Monday through Thursday from 8:00 am -12:30 pm and 1:00 pm-5:00 pm and Friday from 8:00 am-12:00 pm.  Please be advised, all patients with office appointments requiring lab work will take precedent over walk-in lab work.   Labs are drawn by [facility]. Please bring your co-pay at the time of your lab draw.  You may receive a bill from [facility] for your lab work.  Please note if you are on Hydroxychloroquine and and an order has been placed for a Hydroxychloroquine level, you will need to have it drawn 4 hours or more after your last dose.  If you wish to have your labs drawn at another location, please call the office 24 hours in advance so we can fax the orders.  The office is located at [address], [place], [place], [place] [address] No appointment is necessary.    If you have any questions regarding directions or hours of operation,  please call [phone].   As a reminder, please drink plenty of water prior to coming for your lab work. Thanks!   Hydroxychloroquine Tablets What is this medication? HYDROXYCHLOROQUINE (hye drox ee KLOR oh kwin) treats autoimmune conditions, such as  rheumatoid arthritis and lupus. It works by slowing down an overactive immune system. It may also be used to prevent and treat malaria. It works by killing the parasite that causes malaria. It belongs to a group of medications called DMARDs. This medicine may be used for other purposes; ask your health care provider or pharmacist if you have questions. COMMON BRAND NAME(S): Plaquenil, Quineprox What should I tell my care team before I take this medication? They need to know if you have any of these conditions: Diabetes Eye disease, vision problems G6PD deficiency Heart disease History of irregular heartbeat If you often drink alcohol Kidney disease Liver disease Porphyria Psoriasis An unusual or allergic reaction to chloroquine, hydroxychloroquine, other medications, foods, dyes, or preservatives Pregnant or trying to get pregnant Breast-feeding How should I use this medication? Take this medication by mouth with a glass of water. Take it as directed on the prescription label. Do not cut, crush or chew this medication. Swallow the tablets whole. Take it with food. Do not take it more than directed. Take all of this medication unless your care team tells you to stop it early. Keep taking it even if you think you are better. Take products with antacids in them at a different time of day than this medication. Take this medication 4 hours before or 4 hours after antacids. Talk to your care team if you have questions. Talk to your care team about the use of  this medication in children. While this medication may be prescribed for selected conditions, precautions do apply. Overdosage: If you think you have taken too much of this medicine contact a poison control center or emergency room at once. NOTE: This medicine is only for you. Do not share this medicine with others. What if I miss a dose? If you miss a dose, take it as soon as you can. If it is almost time for your next dose, take only that  dose. Do not take double or extra doses. What may interact with this medication? Do not take this medication with any of the following: Cisapride Dronedarone Pimozide Thioridazine This medication may also interact with the following: Ampicillin Antacids Cimetidine Cyclosporine Digoxin Kaolin Medications for diabetes, like insulin, glipizide, glyburide Medications for seizures like carbamazepine, phenobarbital, phenytoin Mefloquine Methotrexate Other medications that prolong the QT interval (cause an abnormal heart rhythm) Praziquantel This list may not describe all possible interactions. Give your health care provider a list of all the medicines, herbs, non-prescription drugs, or dietary supplements you use. Also tell them if you smoke, drink alcohol, or use illegal drugs. Some items may interact with your medicine. What should I watch for while using this medication? Visit your care team for regular checks on your progress. Tell your care team if your symptoms do not start to get better or if they get worse. You may need blood work done while you are taking this medication. If you take other medications that can affect heart rhythm, you may need more testing. Talk to your care team if you have questions. Your vision may be tested before and during use of this medication. Tell your care team right away if you have any change in your eyesight. This medication may cause serious skin reactions. They can happen weeks to months after starting the medication. Contact your care team right away if you notice fevers or flu-like symptoms with a rash. The rash may be red or purple and then turn into blisters or peeling of the skin. Or, you might notice a red rash with swelling of the face, lips or lymph nodes in your neck or under your arms. If you or your family notice any changes in your behavior, such as new or worsening depression, thoughts of harming yourself, anxiety, or other unusual or  disturbing thoughts, or memory loss, call your care team right away. What side effects may I notice from receiving this medication? Side effects that you should report to your care team as soon as possible: Allergic reactions--skin rash, itching, hives, swelling of the face, lips, tongue, or throat Aplastic anemia--unusual weakness or fatigue, dizziness, headache, trouble breathing, increased bleeding or bruising Change in vision Heart rhythm changes--fast or irregular heartbeat, dizziness, feeling faint or lightheaded, chest pain, trouble breathing Infection--fever, chills, cough, or sore throat Low blood sugar (hypoglycemia)--tremors or shaking, anxiety, sweating, cold or clammy skin, confusion, dizziness, rapid heartbeat Muscle injury--unusual weakness or fatigue, muscle pain, dark yellow or brown urine, decrease in amount of urine Pain, tingling, or numbness in the hands or feet Rash, fever, and swollen lymph nodes Redness, blistering, peeling, or loosening of the skin, including inside the mouth Thoughts of suicide or self-harm, worsening mood, or feelings of depression Unusual bruising or bleeding Side effects that usually do not require medical attention (report to your care team if they continue or are bothersome): Diarrhea Headache Nausea Stomach pain Vomiting This list may not describe all possible side effects. Call your doctor for medical advice  about side effects. You may report side effects to FDA at 1-800-FDA-1088. Where should I keep my medication? Keep out of the reach of children and pets. Store at room temperature up to 30 degrees C (86 degrees F). Protect from light. Get rid of any unused medication after the expiration date. To get rid of medications that are no longer needed or have expired: Take the medication to a medication take-back program. Check with your pharmacy or law enforcement to find a location. If you cannot return the medication, check the label or  package insert to see if the medication should be thrown out in the garbage or flushed down the toilet. If you are not sure, ask your care team. If it is safe to put it in the trash, empty the medication out of the container. Mix the medication with cat litter, dirt, coffee grounds, or other unwanted substance. Seal the mixture in a bag or container. Put it in the trash. NOTE: This sheet is a summary. It may not cover all possible information. If you have questions about this medicine, talk to your doctor, pharmacist, or health care provider.  2023 Elsevier/Gold Standard (2020-09-06 00:00:00)

## 2022-03-01 DIAGNOSIS — N132 Hydronephrosis with renal and ureteral calculous obstruction: Secondary | ICD-10-CM | POA: Diagnosis not present

## 2022-03-01 DIAGNOSIS — D72829 Elevated white blood cell count, unspecified: Secondary | ICD-10-CM | POA: Diagnosis not present

## 2022-03-01 NOTE — Progress Notes (Signed)
CBC and CMP stable.   TSH is significantly elevated. T3 and T4 WNL. Hx of positive TPO and thyroglobulin antibody positive.   Please notify the patient and forward results to PCP as requested by the patient.

## 2022-03-02 LAB — URINE CULTURE: Culture: NO GROWTH

## 2022-03-04 ENCOUNTER — Other Ambulatory Visit (HOSPITAL_COMMUNITY): Payer: Self-pay

## 2022-03-04 DIAGNOSIS — N201 Calculus of ureter: Secondary | ICD-10-CM | POA: Diagnosis not present

## 2022-03-04 LAB — COMPLETE METABOLIC PANEL WITH GFR
AG Ratio: 1.6 (calc) (ref 1.0–2.5)
ALT: 23 U/L (ref 6–29)
AST: 22 U/L (ref 10–30)
Albumin: 4.6 g/dL (ref 3.6–5.1)
Alkaline phosphatase (APISO): 59 U/L (ref 31–125)
BUN: 20 mg/dL (ref 7–25)
CO2: 18 mmol/L — ABNORMAL LOW (ref 20–32)
Calcium: 10 mg/dL (ref 8.6–10.2)
Chloride: 102 mmol/L (ref 98–110)
Creat: 0.75 mg/dL (ref 0.50–0.97)
Globulin: 2.8 g/dL (calc) (ref 1.9–3.7)
Glucose, Bld: 66 mg/dL (ref 65–99)
Potassium: 4.8 mmol/L (ref 3.5–5.3)
Sodium: 136 mmol/L (ref 135–146)
Total Bilirubin: 0.4 mg/dL (ref 0.2–1.2)
Total Protein: 7.4 g/dL (ref 6.1–8.1)
eGFR: 104 mL/min/{1.73_m2} (ref >=60)

## 2022-03-04 LAB — CBC WITH DIFFERENTIAL/PLATELET
Absolute Monocytes: 903 cells/uL (ref 200–950)
Basophils Absolute: 77 cells/uL (ref 0–200)
Basophils Relative: 0.5 %
Eosinophils Absolute: 367 cells/uL (ref 15–500)
Eosinophils Relative: 2.4 %
HCT: 44.9 % (ref 35.0–45.0)
Hemoglobin: 15.2 g/dL (ref 11.7–15.5)
Lymphs Abs: 3320 cells/uL (ref 850–3900)
MCH: 30.2 pg (ref 27.0–33.0)
MCHC: 33.9 g/dL (ref 32.0–36.0)
MCV: 89.3 fL (ref 80.0–100.0)
MPV: 10.6 fL (ref 7.5–12.5)
Monocytes Relative: 5.9 %
Neutro Abs: 10634 cells/uL — ABNORMAL HIGH (ref 1500–7800)
Neutrophils Relative %: 69.5 %
Platelets: 341 10*3/uL (ref 140–400)
RBC: 5.03 10*6/uL (ref 3.80–5.10)
RDW: 12 % (ref 11.0–15.0)
Total Lymphocyte: 21.7 %
WBC: 15.3 10*3/uL — ABNORMAL HIGH (ref 3.8–10.8)

## 2022-03-04 LAB — GLUCOSE 6 PHOSPHATE DEHYDROGENASE: G-6PDH: 27.5 U/g Hgb — ABNORMAL HIGH (ref 7.0–20.5)

## 2022-03-04 LAB — THYROID PANEL WITH TSH
Free Thyroxine Index: 1.6 (ref 1.4–3.8)
T3 Uptake: 27 % (ref 22–35)
T4, Total: 6.1 ug/dL (ref 5.1–11.9)
TSH: 10.56 mIU/L — ABNORMAL HIGH

## 2022-03-04 MED ORDER — OXYCODONE-ACETAMINOPHEN 7.5-325 MG PO TABS
1.0000 | ORAL_TABLET | ORAL | 0 refills | Status: DC | PRN
  Filled 2022-03-04: qty 20, 2d supply, fill #0

## 2022-03-04 MED ORDER — PROMETHAZINE HCL 12.5 MG PO TABS
12.5000 mg | ORAL_TABLET | ORAL | 0 refills | Status: DC | PRN
  Filled 2022-03-04: qty 30, 5d supply, fill #0

## 2022-03-05 ENCOUNTER — Other Ambulatory Visit: Payer: Self-pay | Admitting: Urology

## 2022-03-05 ENCOUNTER — Other Ambulatory Visit (HOSPITAL_COMMUNITY): Payer: Self-pay

## 2022-03-05 ENCOUNTER — Other Ambulatory Visit: Payer: Self-pay | Admitting: *Deleted

## 2022-03-05 ENCOUNTER — Telehealth (INDEPENDENT_AMBULATORY_CARE_PROVIDER_SITE_OTHER): Payer: 59 | Admitting: Internal Medicine

## 2022-03-05 ENCOUNTER — Ambulatory Visit
Admission: RE | Admit: 2022-03-05 | Discharge: 2022-03-05 | Disposition: A | Discharge status: Home / Self Care | Payer: 59 | Source: Ambulatory Visit | Attending: Obstetrics and Gynecology | Admitting: Obstetrics and Gynecology

## 2022-03-05 ENCOUNTER — Encounter: Payer: Self-pay | Admitting: Internal Medicine

## 2022-03-05 VITALS — Ht 67.0 in | Wt 216.0 lb

## 2022-03-05 DIAGNOSIS — T402X5A Adverse effect of other opioids, initial encounter: Secondary | ICD-10-CM

## 2022-03-05 DIAGNOSIS — N200 Calculus of kidney: Secondary | ICD-10-CM | POA: Diagnosis not present

## 2022-03-05 DIAGNOSIS — M064 Inflammatory polyarthropathy: Secondary | ICD-10-CM | POA: Insufficient documentation

## 2022-03-05 DIAGNOSIS — Z79899 Other long term (current) drug therapy: Secondary | ICD-10-CM

## 2022-03-05 DIAGNOSIS — E559 Vitamin D deficiency, unspecified: Secondary | ICD-10-CM

## 2022-03-05 DIAGNOSIS — R768 Other specified abnormal immunological findings in serum: Secondary | ICD-10-CM

## 2022-03-05 DIAGNOSIS — K5903 Drug induced constipation: Secondary | ICD-10-CM

## 2022-03-05 DIAGNOSIS — R918 Other nonspecific abnormal finding of lung field: Secondary | ICD-10-CM | POA: Diagnosis not present

## 2022-03-05 DIAGNOSIS — Z803 Family history of malignant neoplasm of breast: Secondary | ICD-10-CM

## 2022-03-05 DIAGNOSIS — Z1239 Encounter for other screening for malignant neoplasm of breast: Secondary | ICD-10-CM | POA: Diagnosis not present

## 2022-03-05 HISTORY — DX: Adverse effect of other opioids, initial encounter: T40.2X5A

## 2022-03-05 MED ORDER — GADOPICLENOL 0.5 MMOL/ML IV SOLN
10.0000 mL | Freq: Once | INTRAVENOUS | Status: AC | PRN
  Administered 2022-03-05: 10 mL via INTRAVENOUS

## 2022-03-05 MED ORDER — HYDROXYCHLOROQUINE SULFATE 200 MG PO TABS
200.0000 mg | ORAL_TABLET | Freq: Two times a day (BID) | ORAL | 0 refills | Status: DC
  Filled 2022-03-05: qty 120, 84d supply, fill #0

## 2022-03-05 MED ORDER — LEVOTHYROXINE SODIUM 50 MCG PO TABS
50.0000 ug | ORAL_TABLET | Freq: Every day | ORAL | 0 refills | Status: DC
  Filled 2022-03-05 – 2022-04-29 (×2): qty 90, 90d supply, fill #0

## 2022-03-05 MED ORDER — GOLYTELY 236 G PO SOLR
ORAL | 0 refills | Status: DC
  Filled 2022-03-05: qty 4000, 17d supply, fill #0
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
## 2022-03-05 NOTE — Progress Notes (Signed)
G6PD slightly elevated. Ok to start on plaquenil.  Recommend rechecking CBC and CMP in 1 month, 3 months, then every 5 months.

## 2022-03-05 NOTE — Assessment & Plan Note (Signed)
With obstructive stone measuried by urology to be 9.5 mm.  Lithotripsy delayed by recent use of NSAIDS until Monday , Oct 9.  Continue flomax , opioid therapy,  incrased hydration .  Checking cr tomorrow

## 2022-03-05 NOTE — Assessment & Plan Note (Signed)
Starting placquenil

## 2022-03-05 NOTE — Assessment & Plan Note (Signed)
secondary to opioid use  Adding go lytely given failure to resolve with dulcolax and miralax

## 2022-03-06 ENCOUNTER — Other Ambulatory Visit (HOSPITAL_COMMUNITY): Payer: Self-pay

## 2022-03-06 ENCOUNTER — Emergency Department (HOSPITAL_COMMUNITY)
Admission: EM | Admit: 2022-03-06 | Discharge: 2022-03-07 | Disposition: A | Discharge status: Home / Self Care | Payer: 59 | Attending: Emergency Medicine | Admitting: Emergency Medicine

## 2022-03-06 ENCOUNTER — Emergency Department (HOSPITAL_COMMUNITY): Payer: 59

## 2022-03-06 ENCOUNTER — Encounter (HOSPITAL_COMMUNITY): Payer: Self-pay | Admitting: Emergency Medicine

## 2022-03-06 ENCOUNTER — Encounter: Payer: Self-pay | Admitting: Internal Medicine

## 2022-03-06 ENCOUNTER — Other Ambulatory Visit: Payer: 59

## 2022-03-06 ENCOUNTER — Other Ambulatory Visit: Payer: Self-pay

## 2022-03-06 DIAGNOSIS — K5900 Constipation, unspecified: Secondary | ICD-10-CM | POA: Diagnosis not present

## 2022-03-06 DIAGNOSIS — N200 Calculus of kidney: Secondary | ICD-10-CM | POA: Insufficient documentation

## 2022-03-06 DIAGNOSIS — E860 Dehydration: Secondary | ICD-10-CM | POA: Insufficient documentation

## 2022-03-06 DIAGNOSIS — I10 Essential (primary) hypertension: Secondary | ICD-10-CM | POA: Diagnosis not present

## 2022-03-06 DIAGNOSIS — R109 Unspecified abdominal pain: Secondary | ICD-10-CM | POA: Diagnosis present

## 2022-03-06 LAB — CBC WITH DIFFERENTIAL/PLATELET
Abs Immature Granulocytes: 0.07 10*3/uL (ref 0.00–0.07)
Basophils Absolute: 0 10*3/uL (ref 0.0–0.1)
Basophils Relative: 0 %
Eosinophils Absolute: 0.4 10*3/uL (ref 0.0–0.5)
Eosinophils Relative: 3 %
HCT: 39.8 % (ref 36.0–46.0)
Hemoglobin: 13.3 g/dL (ref 12.0–15.0)
Immature Granulocytes: 1 %
Lymphocytes Relative: 13 %
Lymphs Abs: 1.9 10*3/uL (ref 0.7–4.0)
MCH: 30 pg (ref 26.0–34.0)
MCHC: 33.4 g/dL (ref 30.0–36.0)
MCV: 89.6 fL (ref 80.0–100.0)
Monocytes Absolute: 1.1 10*3/uL — ABNORMAL HIGH (ref 0.1–1.0)
Monocytes Relative: 7 %
Neutro Abs: 11.6 10*3/uL — ABNORMAL HIGH (ref 1.7–7.7)
Neutrophils Relative %: 76 %
Platelets: 383 10*3/uL (ref 150–400)
RBC: 4.44 MIL/uL (ref 3.87–5.11)
RDW: 12.5 % (ref 11.5–15.5)
WBC: 15.1 10*3/uL — ABNORMAL HIGH (ref 4.0–10.5)
nRBC: 0 % (ref 0.0–0.2)

## 2022-03-06 LAB — URINALYSIS, ROUTINE W REFLEX MICROSCOPIC
Bilirubin Urine: NEGATIVE
Glucose, UA: NEGATIVE mg/dL
Hgb urine dipstick: NEGATIVE
Ketones, ur: 80 mg/dL — AB
Leukocytes,Ua: NEGATIVE
Nitrite: NEGATIVE
Protein, ur: NEGATIVE mg/dL
Specific Gravity, Urine: 1.018 (ref 1.005–1.030)
pH: 5 (ref 5.0–8.0)

## 2022-03-06 LAB — COMPREHENSIVE METABOLIC PANEL
ALT: 17 U/L (ref 0–44)
AST: 10 U/L — ABNORMAL LOW (ref 15–41)
Albumin: 3.5 g/dL (ref 3.5–5.0)
Alkaline Phosphatase: 52 U/L (ref 38–126)
Anion gap: 8 (ref 5–15)
BUN: 9 mg/dL (ref 6–20)
CO2: 22 mmol/L (ref 22–32)
Calcium: 8 mg/dL — ABNORMAL LOW (ref 8.9–10.3)
Chloride: 105 mmol/L (ref 98–111)
Creatinine, Ser: 0.74 mg/dL (ref 0.44–1.00)
GFR, Estimated: >60 mL/min (ref >60)
Glucose, Bld: 86 mg/dL (ref 70–99)
Potassium: 3.9 mmol/L (ref 3.5–5.1)
Sodium: 135 mmol/L (ref 135–145)
Total Bilirubin: 0.6 mg/dL (ref 0.3–1.2)
Total Protein: 7 g/dL (ref 6.5–8.1)

## 2022-03-06 LAB — LIPASE, BLOOD: Lipase: 23 U/L (ref 11–51)

## 2022-03-06 LAB — PREGNANCY, URINE: Preg Test, Ur: NEGATIVE

## 2022-03-06 MED ORDER — KETOROLAC TROMETHAMINE 15 MG/ML IJ SOLN
15.0000 mg | Freq: Once | INTRAMUSCULAR | Status: AC
  Administered 2022-03-06: 15 mg via INTRAVENOUS
  Filled 2022-03-06: qty 1

## 2022-03-06 MED ORDER — SODIUM CHLORIDE 0.9 % IV BOLUS
1000.0000 mL | Freq: Once | INTRAVENOUS | Status: AC
  Administered 2022-03-06: 1000 mL via INTRAVENOUS

## 2022-03-06 MED ORDER — OXYCODONE-ACETAMINOPHEN 7.5-325 MG PO TABS
2.0000 | ORAL_TABLET | ORAL | 0 refills | Status: AC | PRN
  Filled 2022-03-07: qty 48, 4d supply, fill #0

## 2022-03-06 NOTE — ED Triage Notes (Signed)
BIB EMS from home. Left sided flank pain/abdominal pain, and blood in urine. Has been on Cipro.  Has had CT with verified kidney stones.  States feels like kidney stone.  4 mg zofran and 150 mcg fentanyl en route.  20G RAC  500 NS given as well.

## 2022-03-06 NOTE — ED Provider Notes (Signed)
[facility] DEPT Provider Note   CSN: [id number] Arrival date & time: 03/06/22  1655     History {Add pertinent medical, surgical, social history, OB history to HPI:1} Chief Complaint  Patient presents with   Flank Pain    Sonia Holden is a 36 y.o. female.  HPI     This is a 36 year old female who presents with constipation, nausea.  Patient reports that she was recently diagnosed with a kidney stone.  She is due to have lithotripsy on Monday.  She has had some baseline left flank pain but is tolerable with medications.  However for the last week she has had increasing constipation.  She has used multiple over-the-counter stool softeners and laxatives including Dulcolax, mag citrate with minimal results.  She did ultimately have a hard stool in the waiting room.  She states that she has developed some nausea and vomiting.  She was concerned she may have renal dysfunction.  Patient states she was also diagnosed with hypothyroidism.  She has not had any fevers this week.  Home Medications Prior to Admission medications   Medication Sig Start Date End Date Taking? Authorizing Provider  ALPRAZolam Duanne Moron) 0.5 MG tablet Take 1 tablet by mouth daily as needed for anxiety 11/13/21     ASPIRIN 81 PO Take by mouth. Patient not taking: Reported on 03/05/2022    [provider]  ciprofloxacin (CIPRO) 500 MG tablet Take 1 tablet (500 mg total) by mouth every 12 (twelve) hours for 7 days. 02/28/22 03/07/22  Mickie Hillier, PA-C  clindamycin (CLEOCIN T) 1 % external solution Apply a thin coat to the entire face every morning 02/14/22   Moye, [place], MD  Dapsone (ACZONE) 7.5 % GEL Apply to face once every morning 10/18/21   Ralene Bathe, MD  doxepin (SINEQUAN) 50 MG capsule Take 1-2 capsules (50-100 mg total) by mouth at bedtime. 11/13/21     famotidine (PEPCID) 20 MG tablet Take 1 tablet by mouth 2 times a day Patient taking differently: Take 20  mg by mouth as needed. 11/02/21   Crecencio Mc, MD  gabapentin (NEURONTIN) 600 MG tablet Take 1 tablet by mouth 3 times daily. 01/28/22     hydroxychloroquine (PLAQUENIL) 200 MG tablet Take 1 tablet (200 mg total) by mouth 2 (two) times daily Monday-Friday. Patient not taking: Reported on 03/05/2022 03/05/22   Ofilia Neas, PA-C  ibuprofen (ADVIL) 800 MG tablet Take 1 tablet by mouth every 8 hours as needed. Patient not taking: Reported on 03/05/2022 10/08/21     levothyroxine (SYNTHROID) 50 MCG tablet Take 1 tablet (50 mcg total) by mouth daily. 03/05/22   Crecencio Mc, MD  lisdexamfetamine (VYVANSE) 30 MG capsule Take 1 capsule by mouth every morning. 12/19/21     mometasone (ELOCON) 0.1 % cream Apply 1 application to affected areas once daily as needed for rash. 10/18/21   Ralene Bathe, MD  montelukast (SINGULAIR) 10 MG tablet Take 1 tablet (10 mg total) by mouth at bedtime. Patient not taking: Reported on 02/28/2022 11/02/21   Crecencio Mc, MD  Multiple Vitamin (MULTIVITAMIN PO) Take by mouth.    [provider]  nystatin (MYCOSTATIN) 100000 UNIT/ML suspension Swish and swallow 5 mls by mouth 4 times a day for 10 days Patient not taking: Reported on 02/14/2022 08/28/21     ondansetron (ZOFRAN) 4 MG tablet Take 1 tablet (4 mg total) by mouth every 6 (six) hours. 02/28/22   Theodis Blaze  E, PA-C  ondansetron (ZOFRAN-ODT) 4 MG disintegrating tablet Take 1 tablet (4 mg total) by mouth every 8 (eight) hours as needed for nausea or vomiting. 02/09/22   Vanessa Kick, MD  oxyCODONE-acetaminophen (PERCOCET) 7.5-325 MG tablet Take 2 tablets by mouth every 4 (four) hours as needed for up to 4 days. 03/07/22 03/11/22  Crecencio Mc, MD  oxyCODONE-acetaminophen (PERCOCET/ROXICET) 5-325 MG tablet Take 1 tablet by mouth every 6 (six) hours as needed for severe pain. Patient not taking: Reported on 03/05/2022 02/28/22   Mickie Hillier, PA-C  polyethylene glycol (GOLYTELY) 236 g solution Drink 8 ounces  every 30 minutes until constipation is relieved 03/05/22   Crecencio Mc, MD  promethazine (PHENERGAN) 12.5 MG tablet Take 1 tablet (12.5 mg total) by mouth every 4 (four) hours as needed. 03/04/22     tamsulosin (FLOMAX) 0.4 MG CAPS capsule Take 1 capsule (0.4 mg total) by mouth daily after supper. 02/28/22   Mickie Hillier, PA-C  traMADol (ULTRAM) 50 MG tablet Take 1 tablet by mouth every 8 (eight) hours as needed for chronic back pain 02/11/22 07/26/23  Dutch Quint B, FNP  tretinoin (RETIN-A) 0.025 % cream Apply a pea sized amount to the entire face at bedtime 02/14/22   Moye, [place], MD  valACYclovir (VALTREX) 1000 MG tablet TAKE 2 TABLETS BY MOUTH AT ONSET OF SYMPTOMS OF COLD SORES THEN 2 TABLETS 12 HOURS LATER AS DIRECTED. 12/17/21 12/17/22  Ralene Bathe, MD  zolpidem (AMBIEN CR) 12.5 MG CR tablet Take 1 tablet by mouth every day at bedtime as needed for sleep 01/11/22         Allergies    Cephalexin, Doxycycline, Tape, Trazodone, Zyrtec [cetirizine], and Lidocaine    Review of Systems   Review of Systems  Constitutional:  Negative for fever.  Gastrointestinal:  Positive for constipation, nausea and vomiting.  Genitourinary:  Positive for flank pain.  All other systems reviewed and are negative.   Physical Exam Updated Vital Signs BP (!) 158/117   Pulse 99   Temp 98.2 F (36.8 C) (Oral)   Resp 18   LMP 02/20/2022   SpO2 95%  Physical Exam Vitals and nursing note reviewed.  Constitutional:      Appearance: She is well-developed. She is not ill-appearing.  HENT:     Head: Normocephalic and atraumatic.  Eyes:     Pupils: Pupils are equal, round, and reactive to light.  Cardiovascular:     Rate and Rhythm: Normal rate and regular rhythm.     Heart sounds: Normal heart sounds.  Pulmonary:     Effort: Pulmonary effort is normal. No respiratory distress.     Breath sounds: No wheezing.  Abdominal:     General: Bowel sounds are normal.     Palpations: Abdomen is soft.      Tenderness: There is no abdominal tenderness. There is no right CVA tenderness, left CVA tenderness, guarding or rebound.  Musculoskeletal:     Cervical back: Neck supple.  Skin:    General: Skin is warm and dry.  Neurological:     Mental Status: She is alert and oriented to person, place, and time.  Psychiatric:        Mood and Affect: Mood normal.     ED Results / Procedures / Treatments   Labs (all labs ordered are listed, but only abnormal results are displayed) Labs Reviewed  CBC WITH DIFFERENTIAL/PLATELET - Abnormal; Notable for the following components:  Result Value   WBC 15.1 (*)    Neutro Abs 11.6 (*)    Monocytes Absolute 1.1 (*)    All other components within normal limits  COMPREHENSIVE METABOLIC PANEL - Abnormal; Notable for the following components:   Calcium 8.0 (*)    AST 10 (*)    All other components within normal limits  URINALYSIS, ROUTINE W REFLEX MICROSCOPIC - Abnormal; Notable for the following components:   Ketones, ur 80 (*)    All other components within normal limits  LIPASE, BLOOD  PREGNANCY, URINE    EKG None  Radiology MR BREAST BILATERAL W WO CONTRAST INC CAD  Result Date: 03/05/2022 CLINICAL DATA:  High risk screening. Family history of breast carcinoma. EXAM: BILATERAL BREAST MRI WITH AND WITHOUT CONTRAST TECHNIQUE: Multiplanar, multisequence MR images of both breasts were obtained prior to and following the intravenous administration of 10 ml of Vueway. Three-dimensional MR images were rendered by post-processing of the original MR data on an independent workstation. The three-dimensional MR images were interpreted, and findings are reported in the following complete MRI report for this study. Three dimensional images were evaluated at the independent interpreting workstation using the DynaCAD thin client. COMPARISON:  Prior exams including previous breast MRI, 05/12/2020. FINDINGS: Breast composition: b. Scattered fibroglandular tissue.  Background parenchymal enhancement: Minimal Right breast: No mass or abnormal enhancement. Left breast: No mass or abnormal enhancement. Lymph nodes: No abnormal appearing lymph nodes. Ancillary findings:  None. IMPRESSION: No MRI evidence of breast malignancy. RECOMMENDATION: 1. Annual screening mammography, last exam performed on 08/14/2021. 2. Supplemental high risk screening breast MRI in 1 year. BI-RADS CATEGORY  1: Negative. Electronically Signed   By: Lajean Manes M.D.   On: 03/05/2022 12:09   Procedures Procedures  {Document cardiac monitor, telemetry assessment procedure when appropriate:1}  Medications Ordered in ED Medications  sodium chloride 0.9 % bolus 1,000 mL (1,000 mLs Intravenous New Bag/Given 03/06/22 2345)  ketorolac (TORADOL) 15 MG/ML injection 15 mg (15 mg Intravenous Given 03/06/22 2343)    ED Course/ Medical Decision Making/ A&P                           Medical Decision Making Amount and/or Complexity of Data Reviewed Labs: ordered. Radiology: ordered.  Risk Prescription drug management.   ***  {Document critical care time when appropriate:1} {Document review of labs and clinical decision tools ie heart score, Chads2Vasc2 etc:1}  {Document your independent review of radiology images, and any outside records:1} {Document your discussion with family members, caretakers, and with consultants:1} {Document social determinants of health affecting pt's care:1} {Document your decision making why or why not admission, treatments were needed:1} Final Clinical Impression(s) / ED Diagnoses Final diagnoses:  None    Rx / DC Orders ED Discharge Orders     None

## 2022-03-06 NOTE — ED Provider Triage Note (Signed)
Emergency Medicine Provider Triage Evaluation Note  Sonia Holden , a 36 y.o. female  was evaluated in triage.  Pt complains of flank pain. Report recently diagnosed with left side kidney stone on CT scan a week ago and is scheduled to have lithotripsy.  Endorse worsening pain, constipation, facial swelling and nausea for the past few days.  Sts she was also diagnosed with hypothyroidism recently, not started on medication yet  Review of Systems  Positive: As above Negative: As above  Physical Exam  LMP 02/20/2022  Gen:   Awake, no distress   Resp:  Normal effort  MSK:   Moves extremities without difficulty  Other:    Medical Decision Making  Medically screening exam initiated at 5:23 PM.  Appropriate orders placed.  Sonia Holden was informed that the remainder of the evaluation will be completed by another provider, this initial triage assessment does not replace that evaluation, and the importance of remaining in the ED until their evaluation is complete.     Domenic Moras, PA-C 03/06/22 1724

## 2022-03-07 ENCOUNTER — Other Ambulatory Visit (HOSPITAL_COMMUNITY): Payer: Self-pay

## 2022-03-07 ENCOUNTER — Other Ambulatory Visit: Payer: 59

## 2022-03-07 DIAGNOSIS — K5900 Constipation, unspecified: Secondary | ICD-10-CM | POA: Diagnosis not present

## 2022-03-07 DIAGNOSIS — E860 Dehydration: Secondary | ICD-10-CM | POA: Diagnosis not present

## 2022-03-07 DIAGNOSIS — N200 Calculus of kidney: Secondary | ICD-10-CM | POA: Diagnosis not present

## 2022-03-07 NOTE — Progress Notes (Signed)
Pre-op phone call attempted. Left message for patient to return phone call.   

## 2022-03-07 NOTE — Discharge Instructions (Signed)
You were seen today for concerns for constipation and ongoing pain related to your kidney stones.  Your kidney stone could not be visualized on the KUB.  You do not appear to have a large stool burden and it appears that you began to have bowel movements while in the emergency department.  Continue your bowel regimen and pain regimen per urology.  If you develop fevers or worsening symptoms, you should be reevaluated.

## 2022-03-08 ENCOUNTER — Encounter (HOSPITAL_BASED_OUTPATIENT_CLINIC_OR_DEPARTMENT_OTHER): Payer: Self-pay | Admitting: Urology

## 2022-03-08 NOTE — Progress Notes (Signed)
Pre-op phone call complete. Procedure date and arrival time confirmed. 03/11/2022 at 0915. Patient allergies, medications, and medical history verified. Patient advised to hold Aspirin, Advil, and Multivitamin. Patient denies any recent history of chest pain or COVID. Driver secured.

## 2022-03-08 NOTE — Progress Notes (Signed)
Pt updated on schedule change, new arrival time of 0800. Patient verbalized understanding.

## 2022-03-10 ENCOUNTER — Other Ambulatory Visit (HOSPITAL_COMMUNITY): Payer: Self-pay

## 2022-03-11 ENCOUNTER — Ambulatory Visit (HOSPITAL_BASED_OUTPATIENT_CLINIC_OR_DEPARTMENT_OTHER)
Admission: RE | Admit: 2022-03-11 | Discharge: 2022-03-11 | Disposition: A | Discharge status: Home / Self Care | Payer: 59 | Attending: Urology | Admitting: Urology

## 2022-03-11 ENCOUNTER — Other Ambulatory Visit: Payer: Self-pay

## 2022-03-11 ENCOUNTER — Encounter (HOSPITAL_BASED_OUTPATIENT_CLINIC_OR_DEPARTMENT_OTHER): Payer: Self-pay | Admitting: Urology

## 2022-03-11 ENCOUNTER — Encounter (HOSPITAL_BASED_OUTPATIENT_CLINIC_OR_DEPARTMENT_OTHER)
Admission: RE | Disposition: A | Discharge status: Home / Self Care | Payer: Self-pay | Source: Home / Self Care | Attending: Urology

## 2022-03-11 ENCOUNTER — Ambulatory Visit (HOSPITAL_COMMUNITY): Payer: 59

## 2022-03-11 DIAGNOSIS — F17210 Nicotine dependence, cigarettes, uncomplicated: Secondary | ICD-10-CM | POA: Diagnosis not present

## 2022-03-11 DIAGNOSIS — N130 Hydronephrosis with ureteropelvic junction obstruction: Secondary | ICD-10-CM | POA: Diagnosis not present

## 2022-03-11 DIAGNOSIS — N200 Calculus of kidney: Secondary | ICD-10-CM | POA: Insufficient documentation

## 2022-03-11 HISTORY — DX: Insomnia, unspecified: G47.00

## 2022-03-11 HISTORY — DX: Unspecified osteoarthritis, unspecified site: M19.90

## 2022-03-11 HISTORY — PX: EXTRACORPOREAL SHOCK WAVE LITHOTRIPSY: SHX1557

## 2022-03-11 HISTORY — DX: Hypothyroidism, unspecified: E03.9

## 2022-03-11 LAB — POCT PREGNANCY, URINE: Preg Test, Ur: NEGATIVE

## 2022-03-11 SURGERY — LITHOTRIPSY, ESWL
Anesthesia: LOCAL | Laterality: Left

## 2022-03-11 MED ORDER — DIAZEPAM 5 MG PO TABS
10.0000 mg | ORAL_TABLET | ORAL | Status: AC
  Administered 2022-03-11: 10 mg via ORAL

## 2022-03-11 MED ORDER — CIPROFLOXACIN HCL 500 MG PO TABS
ORAL_TABLET | ORAL | Status: AC
  Filled 2022-03-11: qty 1

## 2022-03-11 MED ORDER — SULFAMETHOXAZOLE-TRIMETHOPRIM 800-160 MG PO TABS
1.0000 | ORAL_TABLET | Freq: Once | ORAL | Status: AC
  Administered 2022-03-11: 1 via ORAL
  Filled 2022-03-11: qty 1

## 2022-03-11 MED ORDER — DIPHENHYDRAMINE HCL 25 MG PO CAPS
25.0000 mg | ORAL_CAPSULE | ORAL | Status: AC
  Administered 2022-03-11: 25 mg via ORAL

## 2022-03-11 MED ORDER — DIAZEPAM 5 MG PO TABS
ORAL_TABLET | ORAL | Status: AC
  Filled 2022-03-11: qty 2

## 2022-03-11 MED ORDER — SODIUM CHLORIDE 0.9 % IV SOLN: INTRAVENOUS | Status: DC

## 2022-03-11 MED ORDER — CIPROFLOXACIN HCL 500 MG PO TABS: 500.0000 mg | ORAL_TABLET | ORAL | Status: DC

## 2022-03-11 MED ORDER — DIPHENHYDRAMINE HCL 25 MG PO CAPS
ORAL_CAPSULE | ORAL | Status: AC
  Filled 2022-03-11: qty 1

## 2022-03-11 NOTE — Discharge Instructions (Signed)
See [facility] discharge instructions in chart.  
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
## 2022-03-11 NOTE — Interval H&P Note (Signed)
History and Physical Interval Note:  03/11/2022 10:15 AM  Sonia Holden  has presented today for surgery, with the diagnosis of LEFT URETEROPELVIC JUNCTION STONE.  The various methods of treatment have been discussed with the patient and family. After consideration of risks, benefits and other options for treatment, the patient has consented to  Procedure(s): EXTRACORPOREAL SHOCK WAVE LITHOTRIPSY (ESWL) (Left) as a surgical intervention.  The patient's history has been reviewed, patient examined, no change in status, stable for surgery.  I have reviewed the patient's chart and labs.  Questions were answered to the patient's satisfaction.     [name] Kennadi [place]

## 2022-03-11 NOTE — Op Note (Signed)
See [facility] OP note scanned into chart. 

## 2022-03-12 ENCOUNTER — Other Ambulatory Visit (HOSPITAL_COMMUNITY): Payer: Self-pay

## 2022-03-12 ENCOUNTER — Encounter (HOSPITAL_BASED_OUTPATIENT_CLINIC_OR_DEPARTMENT_OTHER): Payer: Self-pay | Admitting: Urology

## 2022-03-12 MED ORDER — GABAPENTIN 600 MG PO TABS
600.0000 mg | ORAL_TABLET | Freq: Three times a day (TID) | ORAL | 0 refills | Status: DC
  Filled 2022-03-12: qty 60, 20d supply, fill #0

## 2022-03-13 ENCOUNTER — Other Ambulatory Visit (HOSPITAL_COMMUNITY): Payer: Self-pay

## 2022-03-14 ENCOUNTER — Other Ambulatory Visit (HOSPITAL_COMMUNITY): Payer: Self-pay

## 2022-03-14 MED ORDER — LISDEXAMFETAMINE DIMESYLATE 30 MG PO CAPS
30.0000 mg | ORAL_CAPSULE | Freq: Every day | ORAL | 0 refills | Status: DC
  Filled 2022-03-14: qty 30, 30d supply, fill #0

## 2022-03-19 DIAGNOSIS — N201 Calculus of ureter: Secondary | ICD-10-CM | POA: Diagnosis not present

## 2022-03-28 ENCOUNTER — Other Ambulatory Visit (HOSPITAL_COMMUNITY): Payer: Self-pay
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
## 2022-04-03 ENCOUNTER — Other Ambulatory Visit (HOSPITAL_COMMUNITY): Payer: Self-pay

## 2022-04-04 ENCOUNTER — Other Ambulatory Visit (HOSPITAL_COMMUNITY): Payer: Self-pay

## 2022-04-05 ENCOUNTER — Other Ambulatory Visit (HOSPITAL_COMMUNITY): Payer: Self-pay

## 2022-04-05 MED ORDER — DOXEPIN HCL 50 MG PO CAPS
50.0000 mg | ORAL_CAPSULE | Freq: Every evening | ORAL | 3 refills | Status: DC
  Filled 2022-04-05: qty 60, 30d supply, fill #0
  Filled 2022-05-08: qty 60, 30d supply, fill #1
  Filled 2022-06-10: qty 60, 30d supply, fill #2
  Filled 2022-07-11: qty 60, 30d supply, fill #3

## 2022-04-06 ENCOUNTER — Other Ambulatory Visit (HOSPITAL_COMMUNITY): Payer: Self-pay

## 2022-04-08 ENCOUNTER — Other Ambulatory Visit (HOSPITAL_COMMUNITY): Payer: Self-pay

## 2022-04-09 ENCOUNTER — Other Ambulatory Visit (HOSPITAL_COMMUNITY): Payer: Self-pay

## 2022-04-09 DIAGNOSIS — F4000 Agoraphobia, unspecified: Secondary | ICD-10-CM | POA: Diagnosis not present

## 2022-04-09 DIAGNOSIS — F919 Conduct disorder, unspecified: Secondary | ICD-10-CM | POA: Diagnosis not present

## 2022-04-09 DIAGNOSIS — F329 Major depressive disorder, single episode, unspecified: Secondary | ICD-10-CM | POA: Diagnosis not present

## 2022-04-09 MED ORDER — LISDEXAMFETAMINE DIMESYLATE 30 MG PO CAPS
30.0000 mg | ORAL_CAPSULE | Freq: Two times a day (BID) | ORAL | 0 refills | Status: DC
  Filled 2022-04-09 – 2022-04-10 (×2): qty 60, 30d supply, fill #0

## 2022-04-10 ENCOUNTER — Other Ambulatory Visit (HOSPITAL_COMMUNITY): Payer: Self-pay

## 2022-04-10 ENCOUNTER — Other Ambulatory Visit: Payer: Self-pay | Admitting: Dermatology

## 2022-04-10 DIAGNOSIS — R21 Rash and other nonspecific skin eruption: Secondary | ICD-10-CM

## 2022-04-10 MED ORDER — MOMETASONE FUROATE 0.1 % EX CREA
1.0000 | TOPICAL_CREAM | Freq: Every day | CUTANEOUS | 0 refills | Status: DC | PRN
  Filled 2022-04-10: qty 45, 30d supply, fill #0

## 2022-04-11 ENCOUNTER — Other Ambulatory Visit (HOSPITAL_COMMUNITY): Payer: Self-pay

## 2022-04-11 ENCOUNTER — Ambulatory Visit: Payer: 59 | Admitting: Physician Assistant

## 2022-04-11 DIAGNOSIS — M545 Low back pain, unspecified: Secondary | ICD-10-CM

## 2022-04-11 DIAGNOSIS — R002 Palpitations: Secondary | ICD-10-CM

## 2022-04-11 DIAGNOSIS — Z79899 Other long term (current) drug therapy: Secondary | ICD-10-CM

## 2022-04-11 DIAGNOSIS — K5909 Other constipation: Secondary | ICD-10-CM

## 2022-04-11 DIAGNOSIS — M1711 Unilateral primary osteoarthritis, right knee: Secondary | ICD-10-CM

## 2022-04-11 DIAGNOSIS — F5105 Insomnia due to other mental disorder: Secondary | ICD-10-CM

## 2022-04-11 DIAGNOSIS — Z803 Family history of malignant neoplasm of breast: Secondary | ICD-10-CM

## 2022-04-11 DIAGNOSIS — M79641 Pain in right hand: Secondary | ICD-10-CM

## 2022-04-11 DIAGNOSIS — G8929 Other chronic pain: Secondary | ICD-10-CM

## 2022-04-11 DIAGNOSIS — M7918 Myalgia, other site: Secondary | ICD-10-CM

## 2022-04-11 DIAGNOSIS — S83511A Sprain of anterior cruciate ligament of right knee, initial encounter: Secondary | ICD-10-CM

## 2022-04-11 DIAGNOSIS — R768 Other specified abnormal immunological findings in serum: Secondary | ICD-10-CM

## 2022-04-12 ENCOUNTER — Other Ambulatory Visit (HOSPITAL_COMMUNITY): Payer: Self-pay

## 2022-04-12 MED ORDER — GABAPENTIN 600 MG PO TABS
600.0000 mg | ORAL_TABLET | Freq: Three times a day (TID) | ORAL | 0 refills | Status: DC
  Filled 2022-04-12: qty 60, 20d supply, fill #0

## 2022-04-22 ENCOUNTER — Other Ambulatory Visit (HOSPITAL_COMMUNITY): Payer: Self-pay

## 2022-04-29 ENCOUNTER — Other Ambulatory Visit (HOSPITAL_BASED_OUTPATIENT_CLINIC_OR_DEPARTMENT_OTHER): Payer: Self-pay

## 2022-04-29 ENCOUNTER — Other Ambulatory Visit (HOSPITAL_COMMUNITY): Payer: Self-pay

## 2022-04-29 MED ORDER — ALPRAZOLAM 0.5 MG PO TABS
0.5000 mg | ORAL_TABLET | Freq: Every day | ORAL | 2 refills | Status: DC | PRN
  Filled 2022-04-29: qty 30, 30d supply, fill #0
  Filled 2022-05-30: qty 30, 30d supply, fill #1
  Filled 2022-06-29: qty 30, 30d supply, fill #2

## 2022-04-29 MED ORDER — GABAPENTIN 600 MG PO TABS
600.0000 mg | ORAL_TABLET | Freq: Three times a day (TID) | ORAL | 0 refills | Status: DC
  Filled 2022-04-29: qty 60, 20d supply, fill #0

## 2022-04-30 ENCOUNTER — Other Ambulatory Visit (HOSPITAL_COMMUNITY): Payer: Self-pay

## 2022-05-06 ENCOUNTER — Encounter: Payer: Self-pay | Admitting: Internal Medicine

## 2022-05-08 ENCOUNTER — Encounter: Payer: Self-pay | Admitting: Family

## 2022-05-08 ENCOUNTER — Telehealth (INDEPENDENT_AMBULATORY_CARE_PROVIDER_SITE_OTHER): Payer: 59 | Admitting: Family

## 2022-05-08 ENCOUNTER — Other Ambulatory Visit (HOSPITAL_COMMUNITY): Payer: Self-pay

## 2022-05-08 DIAGNOSIS — U071 COVID-19: Secondary | ICD-10-CM

## 2022-05-08 MED ORDER — NIRMATRELVIR/RITONAVIR (PAXLOVID)TABLET
3.0000 | ORAL_TABLET | Freq: Two times a day (BID) | ORAL | 0 refills | Status: AC
  Filled 2022-05-08: qty 30, 5d supply, fill #0

## 2022-05-08 MED ORDER — GUAIFENESIN-CODEINE 100-10 MG/5ML PO SYRP
5.0000 mL | ORAL_SOLUTION | Freq: Every evening | ORAL | 0 refills | Status: DC | PRN
  Filled 2022-05-08: qty 70, 14d supply, fill #0

## 2022-05-08 NOTE — Assessment & Plan Note (Addendum)
Explained EUA for Paxlovid. Criteria met for consideration of Paxlovid,  patient older than 12 years and weight > 40kg, started within 5 days of symptom onset and risk factor for severe disease include: Immunosuppression  Vaccine status: she has not had covid booster.  GFR > 60.  Counseled on adverse effects including altered taste, diarrhea, HTN, and myalgia. Patient has no concerns for pregnancy.   Patient is most comfortable and desires to start Paxlovid and understands to call me with concerns or new symptoms.   I provided her with Cheratussin cough syrup and we discussed sedation as side effect.  Advised her not to use other sedating medications with the Cheratussin including tramadol or Ambien.  She verbalized understanding of this.  I looked up patient on [place] Controlled Substances Reporting System PMP AWARE and saw no activity that raised concern of inappropriate use.

## 2022-05-08 NOTE — Progress Notes (Signed)
Virtual Visit via Video Note  I connected with@  on 05/08/22 at 10:00 AM EST by a video enabled telemedicine application and verified that I am speaking with the correct person using two identifiers.  Location patient: home Location provider:work  Persons participating in the virtual visit: patient, provider  I discussed the limitations of evaluation and management by telemedicine and the availability of in person appointments. The patient expressed understanding and agreed to proceed.   HPI: Symptoms started with fever, HA 5 days ago.  HA has improved. Intermittent fever. Tmax 102F.  Covid positive Endorses bodyaches, nasal congestion. Cough is worse at night.  No sob, wheezing.  She has been using tylenol , dayquil.  She has primary series of covid vaccine.   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 Arthralgia. She is compliant with plaquenil.  GFR > 60  Tessalon Perles have not worked for her in the past.  She does not take tramadol or Ambien daily.  ROS: See pertinent positives and negatives per HPI.    EXAM:  VITALS per patient if applicable: There were no vitals taken for this visit. BP Readings from Last 3 Encounters:  03/11/22 (!) 139/94  03/07/22 128/88  02/28/22 (!) 129/93   Wt Readings from Last 3 Encounters:  03/11/22 212 lb 1.6 oz (96.2 kg)  03/05/22 216 lb (98 kg)  02/28/22 216 lb (98 kg)    GENERAL: alert, oriented, appears well and in no acute distress  HEENT: atraumatic, conjunttiva clear, no obvious abnormalities on inspection of external nose and ears  NECK: normal movements of the head and neck  LUNGS: on inspection no signs of respiratory distress, breathing rate appears normal, no obvious gross SOB, gasping or wheezing  CV: no obvious cyanosis  MS: moves all visible extremities without noticeable abnormality  PSYCH/NEURO: pleasant and cooperative, no obvious depression or anxiety, speech and thought  processing grossly intact  ASSESSMENT AND PLAN:  Discussed the following assessment and plan:  Problem List Items Addressed This Visit       Other   COVID-19 - Primary    Explained EUA for Paxlovid. Criteria met for consideration of Paxlovid,  patient older than 12 years and weight > 40kg, started within 5 days of symptom onset and risk factor for severe disease include: Immunosuppression  Vaccine status: she has not had covid booster.  GFR > 60.  Counseled on adverse effects including altered taste, diarrhea, HTN, and myalgia. Patient has no concerns for pregnancy.   Patient is most comfortable and desires to start Paxlovid and understands to call me with concerns or new symptoms.   I provided her with Cheratussin cough syrup and we discussed sedation as side effect.  Advised her not to use other sedating medications with the Cheratussin including tramadol or Ambien.  She verbalized understanding of this.  I looked up patient on [place] Controlled Substances Reporting System PMP AWARE and saw no activity that raised concern of inappropriate use.         Relevant Medications   nirmatrelvir/ritonavir EUA (PAXLOVID) 20 x 150 MG & 10 x 100MG TABS   guaiFENesin-codeine (ROBITUSSIN AC) 100-10 MG/5ML syrup    -we discussed possible serious and likely etiologies, options for evaluation and workup, limitations of telemedicine visit vs in person visit, treatment, treatment risks and precautions. Pt prefers to treat via telemedicine empirically rather then risking or undertaking an in person visit at this moment.  .   I discussed the assessment and  treatment plan with the patient. The patient was provided an opportunity to ask questions and all were answered. The patient agreed with the plan and demonstrated an understanding of the instructions.   The patient was advised to call back or seek an in-person evaluation if the symptoms worsen or if the condition fails to improve as  anticipated.   Mable Paris, FNP

## 2022-05-08 NOTE — Patient Instructions (Addendum)
I have sent in Cheratussin.  Please take cough medication at night only as needed.  Do not take with Ambien, tramadol .   as we discussed, I do not recommend dosing throughout the day as coughing is a protective mechanism . It also helps to break up thick mucous.  Do not take cough suppressants with alcohol as can lead to trouble breathing. Advise caution if taking cough suppressant and operating machinery ( i.e driving a car) as you may feel very tired.    Please stay in quarantine per cdc guidelines.   If you test positive for COVID-19, stay home for at least 5 days and isolate from others in your home. You are likely most infectious during these first 5 days. Wear a high-quality mask if you must be around others at home and in public. Do not go places where you are unable to wear a mask.  We discussed starting Paxlovid which is an unapproved drug that is authorized for use under an Emergency Use Authorization.  There are no adequate, approved, available products for the treatment of COVID-19 in adults who have mild-to-moderate COVID-19 and are at high risk for progressing to severe COVID-19, including hospitalization or death.  There are benefits and risks of taking this treatment as outlined in the "Fact Sheet for Patients and Caregivers." You may find this document here and please read in detail   [email or web address]   I have sent Paxlovid to your pharmacy. Please call pharmacy so they bring medication out to your car and you do not have to go inside.   PAXLOVID ADMINISTRATION INSTRUCTIONS:  Take with or without food. Swallow the tablets whole. Don't chew, crush, or break the medications because it might not work as well  For each dose of the medication, you should be taking 3 tablets together (2 pink oval and 1 white oval) TWICE a day for FIVE days   Finish your full five-day course of Paxlovid even if you feel better before you're done. Stopping this medication  too early can make it less effective to prevent severe illness related to [place].    Paxlovid is prescribed for YOU ONLY. Don't share it with others, even if they have similar symptoms as you. This medication might not be right for everyone.  Make sure to take steps to protect yourself and others while you're taking this medication in order to get well soon and to prevent others from getting sick with COVID-19.  Paxlovid (nirmatrelvir / ritonavir) can cause hormonal birth control medications to not work well. If you or your partner is currently taking hormonal birth control, use condoms or other birth control methods to prevent unintended pregnancies.   COMMON SIDE EFFECTS: Altered or bad taste in your mouth  Diarrhea  High blood pressure (1% of people) Muscle aches (1% of people)    If your COVID-19 symptoms get worse, get medical help right away. Call 911 if you experience symptoms such as worsening cough, trouble breathing, chest pain that doesn't go away, confusion, a hard time staying awake, and pale or blue-colored skin.This medication won't prevent all COVID-19 cases from getting worse.

## 2022-05-09 ENCOUNTER — Other Ambulatory Visit (HOSPITAL_COMMUNITY): Payer: Self-pay

## 2022-05-09 MED ORDER — ZOLPIDEM TARTRATE ER 12.5 MG PO TBCR
12.5000 mg | EXTENDED_RELEASE_TABLET | Freq: Every evening | ORAL | 2 refills | Status: DC | PRN
  Filled 2022-05-09: qty 30, 30d supply, fill #0
  Filled 2022-06-13: qty 30, 30d supply, fill #1
  Filled 2022-07-16: qty 30, 30d supply, fill #2
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
## 2022-05-13 ENCOUNTER — Other Ambulatory Visit (HOSPITAL_COMMUNITY): Payer: Self-pay

## 2022-05-14 ENCOUNTER — Other Ambulatory Visit (HOSPITAL_COMMUNITY): Payer: Self-pay

## 2022-05-14 MED ORDER — LISDEXAMFETAMINE DIMESYLATE 30 MG PO CAPS
30.0000 mg | ORAL_CAPSULE | Freq: Two times a day (BID) | ORAL | 0 refills | Status: DC
  Filled 2022-05-14: qty 60, 30d supply, fill #0

## 2022-05-16 ENCOUNTER — Ambulatory Visit: Payer: 59 | Admitting: Dermatology

## 2022-05-22 ENCOUNTER — Other Ambulatory Visit (HOSPITAL_COMMUNITY): Payer: Self-pay

## 2022-05-22 ENCOUNTER — Other Ambulatory Visit: Payer: Self-pay

## 2022-05-23 ENCOUNTER — Ambulatory Visit: Payer: 59 | Admitting: Physician Assistant

## 2022-05-23 DIAGNOSIS — R768 Other specified abnormal immunological findings in serum: Secondary | ICD-10-CM

## 2022-05-23 DIAGNOSIS — Z803 Family history of malignant neoplasm of breast: Secondary | ICD-10-CM

## 2022-05-23 DIAGNOSIS — Z79899 Other long term (current) drug therapy: Secondary | ICD-10-CM

## 2022-05-23 DIAGNOSIS — S83511A Sprain of anterior cruciate ligament of right knee, initial encounter: Secondary | ICD-10-CM

## 2022-05-23 DIAGNOSIS — M7918 Myalgia, other site: Secondary | ICD-10-CM

## 2022-05-23 DIAGNOSIS — M1711 Unilateral primary osteoarthritis, right knee: Secondary | ICD-10-CM

## 2022-05-23 DIAGNOSIS — M545 Low back pain, unspecified: Secondary | ICD-10-CM

## 2022-05-23 DIAGNOSIS — R002 Palpitations: Secondary | ICD-10-CM

## 2022-05-23 DIAGNOSIS — F17200 Nicotine dependence, unspecified, uncomplicated: Secondary | ICD-10-CM

## 2022-05-23 DIAGNOSIS — K5909 Other constipation: Secondary | ICD-10-CM

## 2022-05-23 DIAGNOSIS — E559 Vitamin D deficiency, unspecified: Secondary | ICD-10-CM

## 2022-05-23 DIAGNOSIS — F419 Anxiety disorder, unspecified: Secondary | ICD-10-CM

## 2022-05-23 DIAGNOSIS — M0609 Rheumatoid arthritis without rheumatoid factor, multiple sites: Secondary | ICD-10-CM

## 2022-05-23 DIAGNOSIS — R5383 Other fatigue: Secondary | ICD-10-CM

## 2022-05-24 ENCOUNTER — Other Ambulatory Visit (HOSPITAL_COMMUNITY): Payer: Self-pay

## 2022-05-24 MED ORDER — GABAPENTIN 600 MG PO TABS
600.0000 mg | ORAL_TABLET | Freq: Three times a day (TID) | ORAL | 0 refills | Status: DC
  Filled 2022-05-24: qty 60, 20d supply, fill #0

## 2022-05-30 ENCOUNTER — Other Ambulatory Visit: Payer: Self-pay

## 2022-05-31 ENCOUNTER — Other Ambulatory Visit (HOSPITAL_COMMUNITY): Payer: Self-pay

## 2022-05-31 ENCOUNTER — Telehealth: Payer: 59 | Admitting: Family Medicine

## 2022-05-31 DIAGNOSIS — T3695XA Adverse effect of unspecified systemic antibiotic, initial encounter: Secondary | ICD-10-CM

## 2022-05-31 DIAGNOSIS — R3989 Other symptoms and signs involving the genitourinary system: Secondary | ICD-10-CM | POA: Diagnosis not present

## 2022-05-31 DIAGNOSIS — B379 Candidiasis, unspecified: Secondary | ICD-10-CM

## 2022-05-31 MED ORDER — NITROFURANTOIN MONOHYD MACRO 100 MG PO CAPS
100.0000 mg | ORAL_CAPSULE | Freq: Two times a day (BID) | ORAL | 0 refills | Status: AC
  Filled 2022-05-31: qty 10, 5d supply, fill #0

## 2022-05-31 MED ORDER — FLUCONAZOLE 150 MG PO TABS
150.0000 mg | ORAL_TABLET | Freq: Once | ORAL | 0 refills | Status: AC
  Filled 2022-05-31: qty 1, 1d supply, fill #0

## 2022-05-31 NOTE — Patient Instructions (Signed)
Sonia Holden, thank you for joining [name], NP for today's virtual visit.  While this provider is not your primary care provider (PCP), if your PCP is located in our provider database this encounter information will be shared with them immediately following your visit.   [facility] account gives you access to today's visit and all your visits, tests, and labs performed at [facility] " click here if you don't have a [facility] account or go to [email or web address]  Consent: (Patient) Sonia Holden provided verbal consent for this virtual visit at the beginning of the encounter.  Current Medications:  Current Outpatient Medications:    nitrofurantoin, macrocrystal-monohydrate, (MACROBID) 100 MG capsule, Take 1 capsule (100 mg total) by mouth 2 (two) times daily for 5 days., Disp: 10 capsule, Rfl: 0   ALPRAZolam (XANAX) 0.5 MG tablet, Take 1 tablet by mouth daily as needed for anxiety, Disp: 30 tablet, Rfl: 3   ALPRAZolam (XANAX) 0.5 MG tablet, Take 1 tablet (0.5 mg total) by mouth daily as needed for anxiety, Disp: 30 tablet, Rfl: 2   ASPIRIN 81 PO, Take by mouth., Disp: , Rfl:    clindamycin (CLEOCIN T) 1 % external solution, Apply a thin coat to the entire face every morning (Patient not taking: Reported on 05/08/2022), Disp: 60 mL, Rfl: 3   Dapsone (ACZONE) 7.5 % GEL, Apply to face once every morning, Disp: 60 g, Rfl: 2   docusate sodium (COLACE) 100 MG capsule, Take 100 mg by mouth 2 (two) times daily. (Patient not taking: Reported on 05/08/2022), Disp: , Rfl:    doxepin (SINEQUAN) 50 MG capsule, Take 1-2 capsules (50-100 mg total) by mouth at bedtime., Disp: 60 capsule, Rfl: 3   famotidine (PEPCID) 20 MG tablet, Take 1 tablet by mouth 2 times a day (Patient taking differently: Take 20 mg by mouth as needed.), Disp: 180 tablet, Rfl: 1   gabapentin (NEURONTIN) 600 MG tablet, Take 1 tablet (600 mg total) by mouth 3 (three) times  daily., Disp: 60 tablet, Rfl: 0   guaiFENesin-codeine (ROBITUSSIN AC) 100-10 MG/5ML syrup, Take 5 mLs by mouth at bedtime as needed for cough., Disp: 70 mL, Rfl: 0   hydroxychloroquine (PLAQUENIL) 200 MG tablet, Take 1 tablet (200 mg total) by mouth 2 (two) times daily Monday-Friday., Disp: 120 tablet, Rfl: 0   ibuprofen (ADVIL) 800 MG tablet, Take 1 tablet by mouth every 8 hours as needed., Disp: 90 tablet, Rfl: 4   levothyroxine (SYNTHROID) 50 MCG tablet, Take 1 tablet (50 mcg total) by mouth daily., Disp: 90 tablet, Rfl: 0   lisdexamfetamine (VYVANSE) 30 MG capsule, Take 1 capsule (30 mg total) by mouth 2 (two) times daily., Disp: 60 capsule, Rfl: 0   mometasone (ELOCON) 0.1 % cream, Apply 1 application to affected areas once daily as needed for rash., Disp: 45 g, Rfl: 0   Multiple Vitamin (MULTIVITAMIN PO), Take by mouth., Disp: , Rfl:    ondansetron (ZOFRAN) 4 MG tablet, Take 1 tablet (4 mg total) by mouth every 6 (six) hours. (Patient not taking: Reported on 05/08/2022), Disp: 12 tablet, Rfl: 0   polyethylene glycol (GOLYTELY) 236 g solution, Drink 8 ounces every 30 minutes until constipation is relieved (Patient not taking: Reported on 05/08/2022), Disp: 4000 mL, Rfl: 0   promethazine (PHENERGAN) 12.5 MG tablet, Take 1 tablet (12.5 mg total) by mouth every 4 (four) hours as needed. (Patient not taking: Reported on 05/08/2022), Disp: 30 tablet, Rfl: 0  tamsulosin (FLOMAX) 0.4 MG CAPS capsule, Take 1 capsule (0.4 mg total) by mouth daily after supper. (Patient not taking: Reported on 05/08/2022), Disp: 30 capsule, Rfl: 0   traMADol (ULTRAM) 50 MG tablet, Take 1 tablet by mouth every 8 (eight) hours as needed for chronic back pain, Disp: 90 tablet, Rfl: 5   tretinoin (RETIN-A) 0.025 % cream, Apply a pea sized amount to the entire face at bedtime, Disp: 45 g, Rfl: 3   valACYclovir (VALTREX) 1000 MG tablet, TAKE 2 TABLETS BY MOUTH AT ONSET OF SYMPTOMS OF COLD SORES THEN 2 TABLETS 12 HOURS LATER AS  DIRECTED., Disp: 30 tablet, Rfl: 0   zolpidem (AMBIEN CR) 12.5 MG CR tablet, Take 1 tablet (12.5 mg total) by mouth at bedtime as needed., Disp: 30 tablet, Rfl: 2   Medications ordered in this encounter:  Meds ordered this encounter  Medications   nitrofurantoin, macrocrystal-monohydrate, (MACROBID) 100 MG capsule    Sig: Take 1 capsule (100 mg total) by mouth 2 (two) times daily for 5 days.    Dispense:  10 capsule    Refill:  0    Order Specific Question:   Supervising Provider    Answer:   Chase Picket [id number]     *If you need refills on other medications prior to your next appointment, please contact your pharmacy*  Follow-Up: Call back or seek an in-person evaluation if the symptoms worsen or if the condition fails to improve as anticipated.  [place] [phone]  Other Instructions Urinary Tract Infection, Adult A urinary tract infection (UTI) is an infection of any part of the urinary tract. The urinary tract includes: The kidneys. The ureters. The bladder. The urethra. These organs make, store, and get rid of pee (urine) in the body. What are the causes? This infection is caused by germs (bacteria) in your genital area. These germs grow and cause swelling (inflammation) of your urinary tract. What increases the risk? The following factors may make you more likely to develop this condition: Using a small, thin tube (catheter) to drain pee. Not being able to control when you pee or poop (incontinence). Being female. If you are female, these things can increase the risk: Using these methods to prevent pregnancy: A medicine that kills sperm (spermicide). A device that blocks sperm (diaphragm). Having low levels of a female hormone (estrogen). Being pregnant. You are more likely to develop this condition if: You have genes that add to your risk. You are sexually active. You take antibiotic medicines. You have trouble peeing because of: A  prostate that is bigger than normal, if you are female. A blockage in the part of your body that drains pee from the bladder. A kidney stone. A nerve condition that affects your bladder. Not getting enough to drink. Not peeing often enough. You have other conditions, such as: Diabetes. A weak disease-fighting system (immune system). Sickle cell disease. Gout. Injury of the spine. What are the signs or symptoms? Symptoms of this condition include: Needing to pee right away. Peeing small amounts often. Pain or burning when peeing. Blood in the pee. Pee that smells bad or not like normal. Trouble peeing. Pee that is cloudy. Fluid coming from the vagina, if you are female. Pain in the belly or lower back. Other symptoms include: Vomiting. Not feeling hungry. Feeling mixed up (confused). This may be the first symptom in older adults. Being tired and grouchy (irritable). A fever. Watery poop (diarrhea). How is this treated? Taking  antibiotic medicine. Taking other medicines. Drinking enough water. In some cases, you may need to see a specialist. Follow these instructions at home:  Medicines Take over-the-counter and prescription medicines only as told by your doctor. If you were prescribed an antibiotic medicine, take it as told by your doctor. Do not stop taking it even if you start to feel better. General instructions Make sure you: Pee until your bladder is empty. Do not hold pee for a long time. Empty your bladder after sex. Wipe from front to back after peeing or pooping if you are a female. Use each tissue one time when you wipe. Drink enough fluid to keep your pee pale yellow. Keep all follow-up visits. Contact a doctor if: You do not get better after 1-2 days. Your symptoms go away and then come back. Get help right away if: You have very bad back pain. You have very bad pain in your lower belly. You have a fever. You have chills. You feeling like you will  vomit or you vomit. Summary A urinary tract infection (UTI) is an infection of any part of the urinary tract. This condition is caused by germs in your genital area. There are many risk factors for a UTI. Treatment includes antibiotic medicines. Drink enough fluid to keep your pee pale yellow. This information is not intended to replace advice given to you by your health care provider. Make sure you discuss any questions you have with your health care provider. Document Revised: 12/31/2019 Document Reviewed: 12/31/2019 Elsevier Patient Education  [place].     If you have been instructed to have an in-person evaluation today at a local Urgent Care facility, please use the link below. It will take you to a list of all of our available [facility], including address, phone number and hours of operation. Please do not delay care.  [facility]  If you or a family member do not have a primary care provider, use the link below to schedule a visit and establish care. When you choose a [facility] primary care physician or advanced practice provider, you gain a long-term partner in health. Find a Primary Care Provider  Learn more about [facility] in-office and virtual care options: [facility]

## 2022-05-31 NOTE — Progress Notes (Signed)
Virtual Visit Consent   Sonia Holden, you are scheduled for a virtual visit with a Sonia Holden provider today. Just as with appointments in the office, your consent must be obtained to participate. Your consent will be active for this visit and any virtual visit you may have with one of our providers in the next 365 days. If you have a MyChart account, a copy of this consent can be sent to you electronically.  As this is a virtual visit, video technology does not allow for your provider to perform a traditional examination. This may limit your provider's ability to fully assess your condition. If your provider identifies any concerns that need to be evaluated in person or the need to arrange testing (such as labs, EKG, etc.), we will make arrangements to do so. Although advances in technology are sophisticated, we cannot ensure that it will always work on either your end or our end. If the connection with a video visit is poor, the visit may have to be switched to a telephone visit. With either a video or telephone visit, we are not always able to ensure that we have a secure connection.  By engaging in this virtual visit, you consent to the provision of healthcare and authorize for your insurance to be billed (if applicable) for the services provided during this visit. Depending on your insurance coverage, you may receive a charge related to this service.  I need to obtain your verbal consent now. Are you willing to proceed with your visit today? Sonia Holden has provided verbal consent on 05/31/2022 for a virtual visit (video or telephone). Sonia Mayo, NP  Date: 05/31/2022 10:05 AM  Virtual Visit via Video Note   I, Sonia Holden, connected with  Sonia Holden  ([id number], 02/19/1986) on 05/31/22 at 10:00 AM EST by a video-enabled telemedicine application and verified that I am speaking with the correct person using two identifiers.  Location: Patient:  Virtual Visit Location Patient: Home Provider: Virtual Visit Location Provider: Home Office   I discussed the limitations of evaluation and management by telemedicine and the availability of in person appointments. The patient expressed understanding and agreed to proceed.    History of Present Illness: Sonia Holden is a 36 y.o. who identifies as a female who was assigned female at birth, and is being seen today for UTI.  HPI: Urinary Tract Infection  This is a new problem. Episode onset: Two days ago 05/29/22. The problem occurs every urination. The problem has been gradually worsening. The quality of the pain is described as burning. The patient is experiencing no pain. There has been no fever. She is Sexually active. There is No history of pyelonephritis. Associated symptoms include flank pain, frequency, hesitancy and urgency. Pertinent negatives include no chills, discharge, hematuria, nausea, possible pregnancy, sweats or vomiting. Treatments tried: AZO- increased fluids. The treatment provided no relief.    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/2014   ADD (attention deficit disorder) 03/16/2014   Right foot pain 04/24/2013   Lumbar  back pain 02/01/2013    Allergies:  Allergies  Allergen Reactions   Cephalexin    Doxycycline    Tape    Trazodone Hives   Zyrtec [Cetirizine]    Lidocaine Rash    Patch   Medications:  Current Outpatient Medications:    ALPRAZolam (XANAX) 0.5 MG tablet, Take 1 tablet  by mouth daily as needed for anxiety, Disp: 30 tablet, Rfl: 3   ALPRAZolam (XANAX) 0.5 MG tablet, Take 1 tablet (0.5 mg total) by mouth daily as needed for anxiety, Disp: 30 tablet, Rfl: 2   ASPIRIN 81 PO, Take by mouth., Disp: , Rfl:    clindamycin (CLEOCIN T) 1 % external solution, Apply a thin coat to the entire face every morning (Patient not taking: Reported on 05/08/2022), Disp: 60 mL, Rfl: 3   Dapsone (ACZONE) 7.5 % GEL, Apply to face once every morning, Disp: 60 g, Rfl: 2   docusate sodium (COLACE) 100 MG capsule, Take 100 mg by mouth 2 (two) times daily. (Patient not taking: Reported on 05/08/2022), Disp: , Rfl:    doxepin (SINEQUAN) 50 MG capsule, Take 1-2 capsules (50-100 mg total) by mouth at bedtime., Disp: 60 capsule, Rfl: 3   famotidine (PEPCID) 20 MG tablet, Take 1 tablet by mouth 2 times a day (Patient taking differently: Take 20 mg by mouth as needed.), Disp: 180 tablet, Rfl: 1   gabapentin (NEURONTIN) 600 MG tablet, Take 1 tablet (600 mg total) by mouth 3 (three) times daily., Disp: 60 tablet, Rfl: 0   guaiFENesin-codeine (ROBITUSSIN AC) 100-10 MG/5ML syrup, Take 5 mLs by mouth at bedtime as needed for cough., Disp: 70 mL, Rfl: 0   hydroxychloroquine (PLAQUENIL) 200 MG tablet, Take 1 tablet (200 mg total) by mouth 2 (two) times daily Monday-Friday., Disp: 120 tablet, Rfl: 0   ibuprofen (ADVIL) 800 MG tablet, Take 1 tablet by mouth every 8 hours as needed., Disp: 90 tablet, Rfl: 4   levothyroxine (SYNTHROID) 50 MCG tablet, Take 1 tablet (50 mcg total) by mouth daily., Disp: 90 tablet, Rfl: 0   lisdexamfetamine (VYVANSE) 30 MG capsule, Take 1 capsule (30 mg total) by mouth 2 (two) times daily., Disp: 60 capsule, Rfl: 0   mometasone (ELOCON) 0.1 % cream, Apply 1 application to affected areas once daily as needed for rash., Disp: 45 g, Rfl: 0   Multiple Vitamin (MULTIVITAMIN PO), Take by mouth., Disp: , Rfl:    ondansetron (ZOFRAN) 4 MG tablet, Take 1 tablet (4 mg total) by mouth every 6  (six) hours. (Patient not taking: Reported on 05/08/2022), Disp: 12 tablet, Rfl: 0   polyethylene glycol (GOLYTELY) 236 g solution, Drink 8 ounces every 30 minutes until constipation is relieved (Patient not taking: Reported on 05/08/2022), Disp: 4000 mL, Rfl: 0   promethazine (PHENERGAN) 12.5 MG tablet, Take 1 tablet (12.5 mg total) by mouth every 4 (four) hours as needed. (Patient not taking: Reported on 05/08/2022), Disp: 30 tablet, Rfl: 0   tamsulosin (FLOMAX) 0.4 MG CAPS capsule, Take 1 capsule (0.4 mg total) by mouth daily after supper. (Patient not taking: Reported on 05/08/2022), Disp: 30 capsule, Rfl: 0   traMADol (ULTRAM) 50 MG tablet, Take 1 tablet by mouth every 8 (eight) hours as needed for chronic back pain, Disp: 90 tablet, Rfl: 5   tretinoin (RETIN-A) 0.025 % cream, Apply a pea sized amount to the entire face at bedtime, Disp: 45 g, Rfl: 3   valACYclovir (VALTREX) 1000 MG tablet, TAKE 2 TABLETS BY MOUTH AT ONSET  OF SYMPTOMS OF COLD SORES THEN 2 TABLETS 12 HOURS LATER AS DIRECTED., Disp: 30 tablet, Rfl: 0   zolpidem (AMBIEN CR) 12.5 MG CR tablet, Take 1 tablet (12.5 mg total) by mouth at bedtime as needed., Disp: 30 tablet, Rfl: 2  Observations/Objective: Patient is well-developed, well-nourished in no acute distress.  Resting comfortably  at home.  Head is normocephalic, atraumatic.  No labored breathing.  Speech is clear and coherent with logical content.  Patient is alert and oriented at baseline.    Assessment and Plan:   1. Suspected UTI  - nitrofurantoin, macrocrystal-monohydrate, (MACROBID) 100 MG capsule; Take 1 capsule (100 mg total) by mouth 2 (two) times daily for 5 days.  Dispense: 10 capsule; Refill: 0  2. Antibiotic-induced yeast infection  - fluconazole (DIFLUCAN) 150 MG tablet; Take 1 tablet (150 mg total) by mouth once for 1 dose.  Dispense: 1 tablet; Refill: 0  -hydrate -take meds as directed -follow up in person if fevers, n/v/ back pain or blood in urine  happens  Follow Up Instructions: I discussed the assessment and treatment plan with the patient. The patient was provided an opportunity to ask questions and all were answered. The patient agreed with the plan and demonstrated an understanding of the instructions.  A copy of instructions were sent to the patient via MyChart unless otherwise noted below.    The patient was advised to call back or seek an in-person evaluation if the symptoms worsen or if the condition fails to improve as anticipated.  Time:  I spent 6 minutes with the patient via telehealth technology discussing the above problems/concerns.    Sonia Mayo, NP
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
## 2022-06-10 ENCOUNTER — Other Ambulatory Visit: Payer: Self-pay

## 2022-06-10 ENCOUNTER — Other Ambulatory Visit (HOSPITAL_COMMUNITY): Payer: Self-pay

## 2022-06-11 ENCOUNTER — Other Ambulatory Visit: Payer: Self-pay

## 2022-06-11 ENCOUNTER — Other Ambulatory Visit (HOSPITAL_COMMUNITY): Payer: Self-pay

## 2022-06-14 ENCOUNTER — Other Ambulatory Visit (HOSPITAL_COMMUNITY): Payer: Self-pay

## 2022-06-14 ENCOUNTER — Other Ambulatory Visit: Payer: Self-pay

## 2022-06-18 DIAGNOSIS — R3915 Urgency of urination: Secondary | ICD-10-CM | POA: Diagnosis not present

## 2022-06-20 ENCOUNTER — Encounter: Payer: Self-pay | Admitting: Physician Assistant

## 2022-06-20 ENCOUNTER — Other Ambulatory Visit: Payer: Self-pay

## 2022-06-20 ENCOUNTER — Ambulatory Visit: Payer: Commercial Managed Care - PPO | Attending: Physician Assistant | Admitting: Physician Assistant

## 2022-06-20 ENCOUNTER — Other Ambulatory Visit (HOSPITAL_COMMUNITY): Payer: Self-pay

## 2022-06-20 VITALS — BP 138/98 | HR 108 | Ht 67.0 in | Wt 216.0 lb

## 2022-06-20 DIAGNOSIS — M545 Low back pain, unspecified: Secondary | ICD-10-CM | POA: Diagnosis not present

## 2022-06-20 DIAGNOSIS — S83511A Sprain of anterior cruciate ligament of right knee, initial encounter: Secondary | ICD-10-CM

## 2022-06-20 DIAGNOSIS — Z79899 Other long term (current) drug therapy: Secondary | ICD-10-CM | POA: Diagnosis not present

## 2022-06-20 DIAGNOSIS — R002 Palpitations: Secondary | ICD-10-CM

## 2022-06-20 DIAGNOSIS — K5909 Other constipation: Secondary | ICD-10-CM

## 2022-06-20 DIAGNOSIS — R768 Other specified abnormal immunological findings in serum: Secondary | ICD-10-CM | POA: Diagnosis not present

## 2022-06-20 DIAGNOSIS — M25511 Pain in right shoulder: Secondary | ICD-10-CM

## 2022-06-20 DIAGNOSIS — M79641 Pain in right hand: Secondary | ICD-10-CM | POA: Diagnosis not present

## 2022-06-20 DIAGNOSIS — M25512 Pain in left shoulder: Secondary | ICD-10-CM

## 2022-06-20 DIAGNOSIS — M23611 Other spontaneous disruption of anterior cruciate ligament of right knee: Secondary | ICD-10-CM

## 2022-06-20 DIAGNOSIS — E559 Vitamin D deficiency, unspecified: Secondary | ICD-10-CM

## 2022-06-20 DIAGNOSIS — M79642 Pain in left hand: Secondary | ICD-10-CM

## 2022-06-20 DIAGNOSIS — Z803 Family history of malignant neoplasm of breast: Secondary | ICD-10-CM

## 2022-06-20 DIAGNOSIS — G8929 Other chronic pain: Secondary | ICD-10-CM

## 2022-06-20 DIAGNOSIS — M1711 Unilateral primary osteoarthritis, right knee: Secondary | ICD-10-CM

## 2022-06-20 DIAGNOSIS — M7918 Myalgia, other site: Secondary | ICD-10-CM | POA: Diagnosis not present

## 2022-06-20 DIAGNOSIS — M1990 Unspecified osteoarthritis, unspecified site: Secondary | ICD-10-CM

## 2022-06-20 DIAGNOSIS — F419 Anxiety disorder, unspecified: Secondary | ICD-10-CM

## 2022-06-20 DIAGNOSIS — F5105 Insomnia due to other mental disorder: Secondary | ICD-10-CM

## 2022-06-20 MED ORDER — LISDEXAMFETAMINE DIMESYLATE 30 MG PO CAPS
30.0000 mg | ORAL_CAPSULE | Freq: Two times a day (BID) | ORAL | 0 refills | Status: DC
  Filled 2022-06-20 (×2): qty 60, 30d supply, fill #0

## 2022-06-20 NOTE — Telephone Encounter (Signed)
Pending lab results, please refill PLQ per [name], PA-C. Thanks!

## 2022-06-21 ENCOUNTER — Other Ambulatory Visit: Payer: Self-pay

## 2022-06-21 ENCOUNTER — Other Ambulatory Visit (HOSPITAL_COMMUNITY): Payer: Self-pay

## 2022-06-21 LAB — CBC WITH DIFFERENTIAL/PLATELET
Absolute Monocytes: 918 cells/uL (ref 200–950)
Basophils Absolute: 54 cells/uL (ref 0–200)
Basophils Relative: 0.4 %
Eosinophils Absolute: 270 cells/uL (ref 15–500)
Eosinophils Relative: 2 %
HCT: 43.1 % (ref 35.0–45.0)
Hemoglobin: 15.1 g/dL (ref 11.7–15.5)
Lymphs Abs: 3551 cells/uL (ref 850–3900)
MCH: 30.6 pg (ref 27.0–33.0)
MCHC: 35 g/dL (ref 32.0–36.0)
MCV: 87.2 fL (ref 80.0–100.0)
MPV: 10.1 fL (ref 7.5–12.5)
Monocytes Relative: 6.8 %
Neutro Abs: 8708 cells/uL — ABNORMAL HIGH (ref 1500–7800)
Neutrophils Relative %: 64.5 %
Platelets: 406 10*3/uL — ABNORMAL HIGH (ref 140–400)
RBC: 4.94 10*6/uL (ref 3.80–5.10)
RDW: 13.6 % (ref 11.0–15.0)
Total Lymphocyte: 26.3 %
WBC: 13.5 10*3/uL — ABNORMAL HIGH (ref 3.8–10.8)

## 2022-06-21 LAB — COMPLETE METABOLIC PANEL WITH GFR
AG Ratio: 1.5 (calc) (ref 1.0–2.5)
ALT: 16 U/L (ref 6–29)
AST: 14 U/L (ref 10–30)
Albumin: 4.6 g/dL (ref 3.6–5.1)
Alkaline phosphatase (APISO): 49 U/L (ref 31–125)
BUN: 13 mg/dL (ref 7–25)
CO2: 22 mmol/L (ref 20–32)
Calcium: 9.8 mg/dL (ref 8.6–10.2)
Chloride: 104 mmol/L (ref 98–110)
Creat: 0.64 mg/dL (ref 0.50–0.97)
Globulin: 3 g/dL (calc) (ref 1.9–3.7)
Glucose, Bld: 78 mg/dL (ref 65–99)
Potassium: 4.7 mmol/L (ref 3.5–5.3)
Sodium: 138 mmol/L (ref 135–146)
Total Bilirubin: 0.4 mg/dL (ref 0.2–1.2)
Total Protein: 7.6 g/dL (ref 6.1–8.1)
eGFR: 116 mL/min/{1.73_m2} (ref >=60)

## 2022-06-21 MED ORDER — HYDROXYCHLOROQUINE SULFATE 200 MG PO TABS
200.0000 mg | ORAL_TABLET | Freq: Two times a day (BID) | ORAL | 0 refills | Status: DC
  Filled 2022-06-21: qty 120, 84d supply, fill #0

## 2022-06-21 NOTE — Telephone Encounter (Signed)
CMP WNL. WBC count remains elevated.  Absolute neutrophils are elevated.  Plt count borderline elevated-406. Rest of CBC WNL.   Plaquenil 200 mg 1 tablet by mouth twice daily Monday through Friday.

## 2022-06-21 NOTE — Progress Notes (Signed)
CMP WNL.  WBC count remains elevated.  Absolute neutrophils are elevated.  Plt count borderline elevated-406. Rest of CBC WNL.

## 2022-06-25 DIAGNOSIS — R6882 Decreased libido: Secondary | ICD-10-CM | POA: Diagnosis not present

## 2022-06-29 ENCOUNTER — Other Ambulatory Visit (HOSPITAL_COMMUNITY): Payer: Self-pay

## 2022-07-01 ENCOUNTER — Other Ambulatory Visit (HOSPITAL_COMMUNITY): Payer: Self-pay

## 2022-07-01 ENCOUNTER — Other Ambulatory Visit: Payer: Self-pay

## 2022-07-01 MED ORDER — GABAPENTIN 600 MG PO TABS
600.0000 mg | ORAL_TABLET | Freq: Three times a day (TID) | ORAL | 0 refills | Status: DC
  Filled 2022-07-01: qty 60, 20d supply, fill #0

## 2022-07-09 DIAGNOSIS — F4000 Agoraphobia, unspecified: Secondary | ICD-10-CM | POA: Diagnosis not present

## 2022-07-09 DIAGNOSIS — F329 Major depressive disorder, single episode, unspecified: Secondary | ICD-10-CM | POA: Diagnosis not present

## 2022-07-09 DIAGNOSIS — F919 Conduct disorder, unspecified: Secondary | ICD-10-CM | POA: Diagnosis not present

## 2022-07-11 ENCOUNTER — Other Ambulatory Visit: Payer: Self-pay

## 2022-07-11 ENCOUNTER — Other Ambulatory Visit (HOSPITAL_COMMUNITY): Payer: Self-pay

## 2022-07-16 ENCOUNTER — Other Ambulatory Visit: Payer: Self-pay

## 2022-07-23 ENCOUNTER — Other Ambulatory Visit: Payer: Self-pay | Admitting: Internal Medicine

## 2022-07-23 ENCOUNTER — Other Ambulatory Visit (HOSPITAL_COMMUNITY): Payer: Self-pay

## 2022-07-24 ENCOUNTER — Other Ambulatory Visit (HOSPITAL_COMMUNITY): Payer: Self-pay

## 2022-07-24 ENCOUNTER — Other Ambulatory Visit: Payer: Self-pay

## 2022-07-24 MED ORDER — LEVOTHYROXINE SODIUM 50 MCG PO TABS
50.0000 ug | ORAL_TABLET | Freq: Every day | ORAL | 0 refills | Status: DC
  Filled 2022-07-24: qty 90, 90d supply, fill #0

## 2022-07-24 MED ORDER — LISDEXAMFETAMINE DIMESYLATE 30 MG PO CAPS
30.0000 mg | ORAL_CAPSULE | Freq: Two times a day (BID) | ORAL | 0 refills | Status: DC
  Filled 2022-07-24: qty 60, 30d supply, fill #0

## 2022-07-26 ENCOUNTER — Other Ambulatory Visit (HOSPITAL_COMMUNITY): Payer: Self-pay

## 2022-07-26 MED ORDER — ALPRAZOLAM 0.5 MG PO TABS
0.5000 mg | ORAL_TABLET | Freq: Every day | ORAL | 3 refills | Status: DC | PRN
  Filled 2022-07-26: qty 30, 30d supply, fill #0
  Filled 2022-08-27: qty 30, 30d supply, fill #1
  Filled 2022-09-27: qty 30, 30d supply, fill #2
  Filled 2022-10-26: qty 30, 30d supply, fill #3

## 2022-07-27 ENCOUNTER — Other Ambulatory Visit (HOSPITAL_COMMUNITY): Payer: Self-pay

## 2022-07-29 ENCOUNTER — Other Ambulatory Visit: Payer: Self-pay

## 2022-07-29 ENCOUNTER — Other Ambulatory Visit (HOSPITAL_COMMUNITY): Payer: Self-pay

## 2022-07-31 ENCOUNTER — Other Ambulatory Visit: Payer: Self-pay

## 2022-07-31 ENCOUNTER — Other Ambulatory Visit (HOSPITAL_COMMUNITY): Payer: Self-pay

## 2022-07-31 MED ORDER — GABAPENTIN 600 MG PO TABS
ORAL_TABLET | ORAL | 0 refills | Status: DC
  Filled 2022-07-31: qty 60, 20d supply, fill #0
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
## 2022-08-12 ENCOUNTER — Other Ambulatory Visit: Payer: Self-pay

## 2022-08-12 ENCOUNTER — Other Ambulatory Visit (HOSPITAL_COMMUNITY): Payer: Self-pay

## 2022-08-14 ENCOUNTER — Encounter: Payer: Self-pay | Admitting: Physician Assistant

## 2022-08-14 ENCOUNTER — Ambulatory Visit: Payer: Commercial Managed Care - PPO | Attending: Physician Assistant | Admitting: Physician Assistant

## 2022-08-14 ENCOUNTER — Other Ambulatory Visit (HOSPITAL_COMMUNITY): Payer: Self-pay

## 2022-08-14 VITALS — BP 120/86 | HR 112 | Resp 14 | Ht 67.0 in | Wt 213.0 lb

## 2022-08-14 DIAGNOSIS — R590 Localized enlarged lymph nodes: Secondary | ICD-10-CM

## 2022-08-14 DIAGNOSIS — R768 Other specified abnormal immunological findings in serum: Secondary | ICD-10-CM | POA: Diagnosis not present

## 2022-08-14 DIAGNOSIS — M1990 Unspecified osteoarthritis, unspecified site: Secondary | ICD-10-CM

## 2022-08-14 DIAGNOSIS — F5105 Insomnia due to other mental disorder: Secondary | ICD-10-CM

## 2022-08-14 DIAGNOSIS — Z79899 Other long term (current) drug therapy: Secondary | ICD-10-CM | POA: Diagnosis not present

## 2022-08-14 DIAGNOSIS — M79642 Pain in left hand: Secondary | ICD-10-CM

## 2022-08-14 DIAGNOSIS — M25511 Pain in right shoulder: Secondary | ICD-10-CM | POA: Diagnosis not present

## 2022-08-14 DIAGNOSIS — F419 Anxiety disorder, unspecified: Secondary | ICD-10-CM

## 2022-08-14 DIAGNOSIS — K5909 Other constipation: Secondary | ICD-10-CM

## 2022-08-14 DIAGNOSIS — E559 Vitamin D deficiency, unspecified: Secondary | ICD-10-CM

## 2022-08-14 DIAGNOSIS — M545 Low back pain, unspecified: Secondary | ICD-10-CM

## 2022-08-14 DIAGNOSIS — S83511A Sprain of anterior cruciate ligament of right knee, initial encounter: Secondary | ICD-10-CM

## 2022-08-14 DIAGNOSIS — G8929 Other chronic pain: Secondary | ICD-10-CM

## 2022-08-14 DIAGNOSIS — M1711 Unilateral primary osteoarthritis, right knee: Secondary | ICD-10-CM | POA: Diagnosis not present

## 2022-08-14 DIAGNOSIS — Z803 Family history of malignant neoplasm of breast: Secondary | ICD-10-CM

## 2022-08-14 DIAGNOSIS — M23611 Other spontaneous disruption of anterior cruciate ligament of right knee: Secondary | ICD-10-CM

## 2022-08-14 DIAGNOSIS — M79641 Pain in right hand: Secondary | ICD-10-CM

## 2022-08-14 DIAGNOSIS — M7918 Myalgia, other site: Secondary | ICD-10-CM

## 2022-08-14 DIAGNOSIS — R002 Palpitations: Secondary | ICD-10-CM

## 2022-08-14 DIAGNOSIS — M25512 Pain in left shoulder: Secondary | ICD-10-CM

## 2022-08-14 NOTE — Patient Instructions (Signed)
Standing Labs We placed an order today for your standing lab work.   Please have your standing labs drawn in March   Please have your labs drawn 2 weeks prior to your appointment so that the provider can discuss your lab results at your appointment, if possible.  Please note that you may see your imaging and lab results in [employer] before we have reviewed them. We will contact you once all results are reviewed. Please allow our office up to 72 hours to thoroughly review all of the results before contacting the office for clarification of your results.  WALK-IN LAB HOURS  Monday through Thursday from 8:00 am -12:30 pm and 1:00 pm-5:00 pm and Friday from 8:00 am-12:00 pm.  Patients with office visits requiring labs will be seen before walk-in labs.  You may encounter longer than normal wait times. Please allow additional time. Wait times may be shorter on  Monday and Thursday afternoons.  We do not book appointments for walk-in labs. We appreciate your patience and understanding with our staff.   Labs are drawn by [facility]. Please bring your co-pay at the time of your lab draw.  You may receive a bill from [facility] for your lab work.  Please note if you are on Hydroxychloroquine and and an order has been placed for a Hydroxychloroquine level,  you will need to have it drawn 4 hours or more after your last dose.  If you wish to have your labs drawn at another location, please call the office 24 hours in advance so we can fax the orders.  The office is located at [address] [place], [place], [place] [address]   If you have any questions regarding directions or hours of operation,  please call [phone].   As a reminder, please drink plenty of water prior to coming for your lab work. Thanks!

## 2022-08-15 ENCOUNTER — Other Ambulatory Visit (HOSPITAL_COMMUNITY): Payer: Self-pay

## 2022-08-15 ENCOUNTER — Ambulatory Visit: Payer: Self-pay | Admitting: Dermatology

## 2022-08-15 ENCOUNTER — Other Ambulatory Visit: Payer: Self-pay

## 2022-08-15 DIAGNOSIS — M25552 Pain in left hip: Secondary | ICD-10-CM | POA: Diagnosis not present

## 2022-08-15 DIAGNOSIS — M545 Low back pain, unspecified: Secondary | ICD-10-CM | POA: Diagnosis not present

## 2022-08-15 MED ORDER — ZOLPIDEM TARTRATE ER 12.5 MG PO TBCR
12.5000 mg | EXTENDED_RELEASE_TABLET | Freq: Every evening | ORAL | 2 refills | Status: DC | PRN
  Filled 2022-08-15: qty 30, 30d supply, fill #0
  Filled 2022-09-14: qty 30, 30d supply, fill #1
  Filled 2022-10-15 (×2): qty 30, 30d supply, fill #2

## 2022-08-16 DIAGNOSIS — M5416 Radiculopathy, lumbar region: Secondary | ICD-10-CM | POA: Diagnosis not present

## 2022-08-20 ENCOUNTER — Other Ambulatory Visit (HOSPITAL_COMMUNITY): Payer: Self-pay

## 2022-08-20 ENCOUNTER — Other Ambulatory Visit: Payer: Self-pay

## 2022-08-20 MED ORDER — GABAPENTIN 600 MG PO TABS
600.0000 mg | ORAL_TABLET | Freq: Three times a day (TID) | ORAL | 0 refills | Status: DC
  Filled 2022-08-20: qty 60, 20d supply, fill #0

## 2022-08-20 MED ORDER — DOXEPIN HCL 50 MG PO CAPS
50.0000 mg | ORAL_CAPSULE | Freq: Every day | ORAL | 3 refills | Status: DC
  Filled 2022-08-20: qty 60, 30d supply, fill #0
  Filled 2022-08-21 – 2022-09-24 (×2): qty 60, 30d supply, fill #1
  Filled 2022-10-20: qty 60, 30d supply, fill #2
  Filled 2022-11-21: qty 60, 30d supply, fill #3

## 2022-08-21 ENCOUNTER — Other Ambulatory Visit: Payer: Self-pay

## 2022-08-21 ENCOUNTER — Other Ambulatory Visit (HOSPITAL_COMMUNITY): Payer: Self-pay

## 2022-08-21 ENCOUNTER — Ambulatory Visit (HOSPITAL_COMMUNITY)
Admission: RE | Admit: 2022-08-21 | Discharge: 2022-08-21 | Disposition: A | Discharge status: Home / Self Care | Payer: Commercial Managed Care - PPO | Source: Ambulatory Visit | Attending: Physician Assistant | Admitting: Physician Assistant

## 2022-08-21 DIAGNOSIS — R221 Localized swelling, mass and lump, neck: Secondary | ICD-10-CM | POA: Diagnosis not present

## 2022-08-21 DIAGNOSIS — R590 Localized enlarged lymph nodes: Secondary | ICD-10-CM | POA: Diagnosis not present

## 2022-08-21 NOTE — Progress Notes (Signed)
I called the patient to review ultrasound results-benign appearing cervical lymph node noted.

## 2022-08-22 ENCOUNTER — Other Ambulatory Visit: Payer: Self-pay

## 2022-08-27 ENCOUNTER — Other Ambulatory Visit (HOSPITAL_COMMUNITY): Payer: Self-pay

## 2022-08-27 ENCOUNTER — Other Ambulatory Visit: Payer: Self-pay

## 2022-08-27 DIAGNOSIS — R102 Pelvic and perineal pain: Secondary | ICD-10-CM | POA: Diagnosis not present

## 2022-08-27 MED ORDER — LISDEXAMFETAMINE DIMESYLATE 30 MG PO CAPS
30.0000 mg | ORAL_CAPSULE | Freq: Two times a day (BID) | ORAL | 0 refills | Status: DC
  Filled 2022-08-27: qty 60, 30d supply, fill #0

## 2022-09-06 ENCOUNTER — Other Ambulatory Visit: Payer: Self-pay | Admitting: Family

## 2022-09-06 ENCOUNTER — Other Ambulatory Visit (HOSPITAL_COMMUNITY): Payer: Self-pay

## 2022-09-09 ENCOUNTER — Other Ambulatory Visit: Payer: Self-pay | Admitting: Internal Medicine

## 2022-09-09 ENCOUNTER — Other Ambulatory Visit: Payer: Self-pay

## 2022-09-09 ENCOUNTER — Other Ambulatory Visit (HOSPITAL_COMMUNITY): Payer: Self-pay

## 2022-09-09 MED ORDER — CYCLOBENZAPRINE HCL 10 MG PO TABS
10.0000 mg | ORAL_TABLET | Freq: Three times a day (TID) | ORAL | 1 refills | Status: DC | PRN
  Filled 2022-09-09: qty 30, 10d supply, fill #0
  Filled 2022-10-10: qty 30, 10d supply, fill #1

## 2022-09-09 NOTE — Telephone Encounter (Signed)
Looks like rx was discontinued by a different provider.

## 2022-09-10 DIAGNOSIS — M1711 Unilateral primary osteoarthritis, right knee: Secondary | ICD-10-CM | POA: Diagnosis not present

## 2022-09-13 ENCOUNTER — Other Ambulatory Visit (HOSPITAL_COMMUNITY): Payer: Self-pay

## 2022-09-13 ENCOUNTER — Other Ambulatory Visit: Payer: Self-pay

## 2022-09-13 MED ORDER — GABAPENTIN 600 MG PO TABS
600.0000 mg | ORAL_TABLET | Freq: Three times a day (TID) | ORAL | 0 refills | Status: DC
  Filled 2022-09-13: qty 60, 20d supply, fill #0

## 2022-09-16 ENCOUNTER — Other Ambulatory Visit: Payer: Self-pay

## 2022-09-17 ENCOUNTER — Other Ambulatory Visit (HOSPITAL_COMMUNITY): Payer: Self-pay

## 2022-09-17 ENCOUNTER — Other Ambulatory Visit: Payer: Self-pay | Admitting: Internal Medicine

## 2022-09-19 ENCOUNTER — Other Ambulatory Visit (HOSPITAL_COMMUNITY): Payer: Self-pay

## 2022-09-19 ENCOUNTER — Ambulatory Visit: Payer: Commercial Managed Care - PPO | Admitting: Dermatology

## 2022-09-19 ENCOUNTER — Other Ambulatory Visit: Payer: Self-pay

## 2022-09-19 VITALS — BP 122/85

## 2022-09-19 DIAGNOSIS — Z1283 Encounter for screening for malignant neoplasm of skin: Secondary | ICD-10-CM

## 2022-09-19 DIAGNOSIS — L578 Other skin changes due to chronic exposure to nonionizing radiation: Secondary | ICD-10-CM

## 2022-09-19 DIAGNOSIS — Z86018 Personal history of other benign neoplasm: Secondary | ICD-10-CM | POA: Diagnosis not present

## 2022-09-19 DIAGNOSIS — L700 Acne vulgaris: Secondary | ICD-10-CM | POA: Diagnosis not present

## 2022-09-19 DIAGNOSIS — L814 Other melanin hyperpigmentation: Secondary | ICD-10-CM | POA: Diagnosis not present

## 2022-09-19 DIAGNOSIS — L821 Other seborrheic keratosis: Secondary | ICD-10-CM

## 2022-09-19 DIAGNOSIS — B009 Herpesviral infection, unspecified: Secondary | ICD-10-CM

## 2022-09-19 DIAGNOSIS — L818 Other specified disorders of pigmentation: Secondary | ICD-10-CM | POA: Diagnosis not present

## 2022-09-19 MED ORDER — MOMETASONE FUROATE 0.1 % EX CREA
1.0000 | TOPICAL_CREAM | CUTANEOUS | 1 refills | Status: DC
  Filled 2022-09-19: qty 45, 30d supply, fill #0
  Filled 2023-01-06: qty 45, 30d supply, fill #1

## 2022-09-19 MED ORDER — TRETINOIN 0.025 % EX CREA
TOPICAL_CREAM | Freq: Every day | CUTANEOUS | 6 refills | Status: AC
  Filled 2022-09-19: qty 45, 30d supply, fill #0
  Filled 2022-10-20: qty 45, 30d supply, fill #1
  Filled 2023-02-08: qty 45, 30d supply, fill #2
  Filled 2023-05-02: qty 45, 30d supply, fill #3
  Filled 2023-06-09: qty 45, 30d supply, fill #4
  Filled 2023-07-14: qty 45, 30d supply, fill #5

## 2022-09-19 MED ORDER — CLINDAMYCIN PHOSPHATE 1 % EX SOLN
1.0000 | Freq: Every morning | CUTANEOUS | 11 refills | Status: DC
  Filled 2022-09-19: qty 60, 30d supply, fill #0
  Filled 2022-10-26: qty 60, 30d supply, fill #1
  Filled 2022-11-30: qty 60, 30d supply, fill #2
  Filled 2023-02-08: qty 60, 30d supply, fill #3
  Filled 2023-03-10: qty 60, 30d supply, fill #4
  Filled 2023-05-02: qty 60, 30d supply, fill #5
  Filled 2023-06-09: qty 60, 30d supply, fill #6
  Filled 2023-07-14: qty 60, 30d supply, fill #7

## 2022-09-19 MED ORDER — DAPSONE 7.5 % EX GEL
1.0000 | Freq: Every morning | CUTANEOUS | 11 refills | Status: DC
  Filled 2022-09-19: qty 60, 30d supply, fill #0
  Filled 2022-10-20: qty 60, 30d supply, fill #1
  Filled 2023-05-02: qty 60, 30d supply, fill #2
  Filled 2023-06-09: qty 60, 30d supply, fill #3
  Filled 2023-07-14: qty 60, 30d supply, fill #4

## 2022-09-19 NOTE — Progress Notes (Signed)
Follow-Up Visit   Subjective  Sonia Holden is a 37 y.o. female who presents for the following: Skin Cancer Screening and Full Body Skin Exam, hx of Dysplastic Nevus, acne face, Tretinoin 0.025% q 3 days  The patient presents for Total-Body Skin Exam (TBSE) for skin cancer screening and mole check. The patient has spots, moles and lesions to be evaluated, some may be new or changing and the patient has concerns that these could be cancer.  The following portions of the chart were reviewed this encounter and updated as appropriate: medications, allergies, medical history  Review of Systems:  No other skin or systemic complaints except as noted in HPI or Assessment and Plan.  Objective  Well appearing patient in no apparent distress; mood and affect are within normal limits.  A full examination was performed including scalp, head, eyes, ears, nose, lips, neck, chest, axillae, abdomen, back, buttocks, bilateral upper extremities, bilateral lower extremities, hands, feet, fingers, toes, fingernails, and toenails. All findings within normal limits unless otherwise noted below.   Relevant physical exam findings are noted in the Assessment and Plan.    Assessment & Plan   LENTIGINES, SEBORRHEIC KERATOSES, HEMANGIOMAS - Benign normal skin lesions - Benign-appearing - Call for any changes  MELANOCYTIC NEVI - Tan-brown and/or pink-flesh-colored symmetric macules and papules - Benign appearing on exam today - Observation - Call clinic for new or changing moles - Recommend daily use of broad spectrum spf 30+ sunscreen to sun-exposed areas.   ACTINIC DAMAGE - Chronic condition, secondary to cumulative UV/sun exposure - diffuse scaly erythematous macules with underlying dyspigmentation - Recommend daily broad spectrum sunscreen SPF 30+ to sun-exposed areas, reapply every 2 hours as needed.  - Staying in the shade or wearing long sleeves, sun glasses (UVA+UVB protection) and wide  brim hats (4-inch brim around the entire circumference of the hat) are also recommended for sun protection.  - Call for new or changing lesions. Discussed BBL to chest Counseling for BBL / IPL / Laser and Coordination of Care Discussed the treatment option of Broad Band Light (BBL) /Intense Pulsed Light (IPL)/ Laser for skin discoloration, including brown spots and redness.  Typically we recommend at least 1-3 treatment sessions about 5-8 weeks apart for best results.  Cannot have tanned skin when BBL performed, and regular use of sunscreen is advised after the procedure to help maintain results. The patient's condition may also require "maintenance treatments" in the future.  The fee for BBL / laser treatments is $350 per treatment session for the whole face.  A fee can be quoted for other parts of the body.  Insurance typically does not pay for BBL/laser treatments and therefore the fee is an out-of-pocket cost.   SKIN CANCER SCREENING PERFORMED TODAY.  HISTORY OF DYSPLASTIC NEVUS No evidence of recurrence today Recommend regular full body skin exams Recommend daily broad spectrum sunscreen SPF 30+ to sun-exposed areas, reapply every 2 hours as needed.  Call if any new or changing lesions are noted between office visits  - R sup ear helix   ACNE VULGARIS Exam: Open comedones and inflammatory papules  Chronic condition with duration or expected duration over one year. Currently well-controlled.  Treatment Plan: Cont Tretinoin 0.025% cr qhs as tolerated Cont Aczone qam to face Cont Clindamycin solution qam to face Topical retinoid medications like tretinoin/Retin-A, adapalene/Differin, tazarotene/Fabior, and Epiduo/Epiduo Forte can cause dryness and irritation when first started. Only apply a pea-sized amount to the entire affected area. Avoid applying it around  the eyes, edges of mouth and creases at the nose. If you experience irritation, use a good moisturizer first and/or apply the  medicine less often. If you are doing well with the medicine, you can increase how often you use it until you are applying every night. Be careful with sun protection while using this medication as it can make you sensitive to the sun. This medicine should not be used by pregnant women.    MELASMA Exam: reticulated hyperpigmented patches at face  Chronic and persistent condition with duration or expected duration over one year. Condition is bothersome/symptomatic for patient. Currently flared.  Melasma is a chronic; persistent condition of hyperpigmented patches generally on the face, worse in summer due to higher UV exposure.    Heredity; thyroid disease; sun exposure; pregnancy; birth control pills; epilepsy medication and darker skin may predispose to Melasma.   Recommendations include: - Sun avoidance and daily broad spectrum (UVA/UVB) tinted mineral sunscreen SPF 30+, with Zinc or Titanium Dioxide. - Rx topical bleaching creams (i.e. hydroquinone) is a common treatment but should not be used long term.  Hydroquinones may be mixed with retinoids; vitamin C; steroids; Kojic Acid. - Alastin A-luminate, retinoids, vitamin C, topical tranexamic acid, glycolic acid and kojic acid can be used for brightening while on break from hydroquinone - Rx Azelaic Acid is also a treatment option that is safe for pregnancy (Category B). - OTC Heliocare can be helpful in control and prevention. - Oral Rx with Tranexamic Acid 250 mg - 650 mg po daily can be used for moderate to severe cases especially during summer (contraindications include pregnancy; lactation; hx of PE; hx of DVT; clotting disorder; heart disease; anticoagulant use and upcoming long trips)   - Chemical peels (would need multiple for best result).  - Lasers and  Microdermabrasion may also be helpful adjunct treatments.  Treatment Plan: Start Skin Medicinals Hydroquinone 12% / Kojic Acid 6% / Niacinamide 2% / Vitamin C 1% Cream  HERPESVIRAL  INFECTION (COLD SORES) Exam Resolving blister right lower lip   Herpes Simplex Virus = Cold Sores = Fever Blisters is a chronic recurring blistering; scabbing sore-producing viral infection that is recurrent usually in the same area triggered by stress, sun/UV exposure and trauma.  It is infectious and can be spread from person to person by direct contact.  It is not curable, but is treatable with topical and oral medication.  Treatment Plan Cont Valacyclovir 2 grams every 12 hours for 2 doses with a glass of water at the first sign of symptoms.   HISTORY OF ATOPC DERMATITIS Exam: trunk clear today  Treatment Plan: Cont Mometasone cr qd up to 2 wks aa eczema on body prn flares, avoid face, groin, axilla   Topical steroids (such as triamcinolone, fluocinolone, fluocinonide, mometasone, clobetasol, halobetasol, betamethasone, hydrocortisone) can cause thinning and lightening of the skin if they are used for too long in the same area. Your physician has selected the right strength medicine for your problem and area affected on the body. Please use your medication only as directed by your physician to prevent side effects.    Return in about 1 year (around 09/19/2023) for TBSE, Hx of Dysplastic nevi, with Dr. Roseanne Reno.  I, Ardis Rowan, RMA, am acting as scribe for Darden Dates, MD .   Documentation: I have reviewed the above documentation for accuracy and completeness, and I agree with the above.  Darden Dates, MD

## 2022-09-19 NOTE — Patient Instructions (Addendum)
Melasma Melasma is a chronic; persistent condition of hyperpigmented patches generally on the face, worse in summer due to higher UV exposure.    Heredity; thyroid disease; sun exposure; pregnancy; birth control pills; epilepsy medication and darker skin may predispose to Melasma.   Recommendations include: - Sun avoidance and daily broad spectrum (UVA/UVB) tinted mineral sunscreen SPF 30+, with Zinc or Titanium Dioxide. - Rx topical bleaching creams (i.e. hydroquinone) is a common treatment but should not be used long term.  Hydroquinones may be mixed with retinoids; vitamin C; steroids; Kojic Acid. - Alastin A-luminate, retinoids, vitamin C, topical tranexamic acid, glycolic acid and kojic acid can be used for brightening while on break from hydroquinone - Rx Azelaic Acid is also a treatment option that is safe for pregnancy (Category B). - OTC Heliocare can be helpful in control and prevention. - Oral Rx with Tranexamic Acid 250 mg - 650 mg po daily can be used for moderate to severe cases especially during summer (contraindications include pregnancy; lactation; hx of PE; hx of DVT; clotting disorder; heart disease; anticoagulant use and upcoming long trips)   - Chemical peels (would need multiple for best result).  - Lasers and  Microdermabrasion may also be helpful adjunct treatments.  Start [facility] Hydroquinone 12% / Kojic Acid 6% / Niacinamide 2% / Vitamin C 1% Cream 2 times a day for up to 3 months then stop  Instructions for [facility] Medications  One or more of your medications was sent to the [facility] mail order compounding pharmacy. You will receive an email from them and can purchase the medicine through that link. It will then be mailed to your home at the address you confirmed. If for any reason you do not receive an email from them, please check your spam folder. If you still do not find the email, please let us know. [facility] phone number is [phone].    Alastin neck cream Neostarta Triple Firming Neck cream Dermend Fragile skin formula  Due to recent changes in healthcare laws, you may see results of your pathology and/or laboratory studies on MyChart before the doctors have had a chance to review them. We understand that in some cases there may be results that are confusing or concerning to you. Please understand that not all results are received at the same time and often the doctors may need to interpret multiple results in order to provide you with the best plan of care or course of treatment. Therefore, we ask that you please give [place] 2 business days to thoroughly review all your results before contacting the office for clarification. Should we see a critical lab result, you will be contacted sooner.   If You Need Anything After Your Visit  If you have any questions or concerns for your doctor, please call our main line at [phone] and press option 4 to reach your doctor's medical assistant. If no one answers, please leave a voicemail as directed and we will return your call as soon as possible. Messages left after 4 pm will be answered the following business day.   You may also send [place] a message via MyChart. We typically respond to MyChart messages within 1-2 business days.  For prescription refills, please ask your pharmacy to contact our office. Our fax number is [phone].  If you have an urgent issue when the clinic is closed that cannot wait until the next business day, you can page your doctor at the number below.  Please note that while we do our best to be available for urgent issues outside of office hours, we are not available 24/7.   If you have an urgent issue and are unable to reach [place], you may choose to seek medical care at your doctor's office, retail clinic, urgent care center, or emergency room.  If you have a medical emergency, please immediately call 911 or go to the emergency department.  Pager  Numbers  - Dr. Gwen Pounds: [phone]  - Dr. Neale Burly: [phone]  - Dr. Roseanne Reno: [phone]  In the event of inclement weather, please call our main line at [phone] for an update on the status of any delays or closures.  Dermatology Medication Tips: Please keep the boxes that topical medications come in in order to help keep track of the instructions about where and how to use these. Pharmacies typically print the medication instructions only on the boxes and not directly on the medication tubes.   If your medication is too expensive, please contact our office at [phone] option 4 or send [place] a message through MyChart.   We are unable to tell what your co-pay for medications will be in advance as this is different depending on your insurance coverage. However, we may be able to find a substitute medication at lower cost or fill out paperwork to get insurance to cover a needed medication.   If a prior authorization is required to get your medication covered by your insurance company, please allow [place] 1-2 business days to complete this process.  Drug prices often vary depending on where the prescription is filled and some pharmacies may offer cheaper prices.  The website www.goodrx.com contains coupons for medications through different pharmacies. The prices here do not account for what the cost may be with help from insurance (it may be cheaper with your insurance), but the website can give you the price if you did not use any insurance.  - You can print the associated coupon and take it with your prescription to the pharmacy.  - You may also stop by our office during regular business hours and pick up a GoodRx coupon card.  - If you need your prescription sent electronically to a different pharmacy, notify our office through [facility] or by phone at [phone] option 4.     Si Usted Necesita Algo Despus de Su Visita  Tambin puede enviarnos un mensaje a travs de  Clinical cytogeneticist. Por lo general respondemos a los mensajes de MyChart en el transcurso de 1 a 2 das hbiles.  Para renovar recetas, por favor pida a su farmacia que se ponga en contacto con nuestra oficina. Annie Sable de fax es [place] [phone].  Si tiene un asunto urgente cuando la clnica est cerrada y que no puede esperar hasta el siguiente da hbil, puede llamar/localizar a su doctor(a) al nmero que aparece a continuacin.   Por favor, tenga en cuenta que aunque hacemos todo lo posible para estar disponibles para asuntos urgentes fuera del horario de [place], no estamos disponibles las 24 horas del da, los 7 [address]  [address] de la [place].   Si tiene un problema urgente y no puede comunicarse con nosotros, puede optar por buscar atencin mdica  en el consultorio de su doctor(a), en una clnica privada, en un centro de atencin urgente o en una sala de emergencias.  Si tiene Engineer, drilling, por favor llame inmediatamente al 911 o vaya a la sala de emergencias.  Nmeros de bper  - Dr. Gwen Pounds:  [phone]  - Dra. Moye: [phone]  - Dra. Roseanne Reno: [phone]  En caso de inclemencias del [place], por favor llame a Lacy Duverney principal al [phone] para una actualizacin sobre el [place] de cualquier retraso o cierre.  Consejos para la medicacin en dermatologa: Por favor, guarde las cajas en las que vienen los medicamentos de uso tpico para ayudarle a seguir las instrucciones sobre dnde y cmo usarlos. Las farmacias generalmente imprimen las instrucciones del medicamento slo en las cajas y no directamente en los tubos del [place].   Si su medicamento es muy caro, por favor, pngase en contacto con Rolm Gala llamando al [phone] y presione la opcin 4 o envenos un mensaje a travs de Clinical cytogeneticist.   No podemos decirle cul ser su copago por los medicamentos por adelantado ya que esto es diferente dependiendo de la cobertura de su seguro. Sin embargo, es posible que  podamos encontrar un medicamento sustituto a Audiological scientist un formulario para que el seguro cubra el medicamento que se considera necesario.   Si se requiere una autorizacin previa para que su compaa de seguros [place] su medicamento, por favor permtanos de 1 a 2 das hbiles para completar [address]  Los precios de los medicamentos varan con frecuencia dependiendo del Environmental consultant de dnde se surte la receta y alguna farmacias pueden ofrecer precios ms baratos.  El sitio web www.goodrx.com tiene cupones para medicamentos de Health and safety inspector. Los precios aqu no tienen en cuenta lo que podra costar con la ayuda del seguro (puede ser ms barato con su seguro), pero el sitio web puede darle el precio si no utiliz Tourist information centre manager.  - Puede imprimir el cupn correspondiente y llevarlo con su receta a la farmacia.  - Tambin puede pasar por nuestra oficina durante el horario de atencin regular y Education officer, museum una tarjeta de cupones de GoodRx.  - Si necesita que su receta se enve electrnicamente a una farmacia diferente, informe a nuestra oficina a travs de MyChart de [place] o por telfono llamando al [phone] y presione la opcin 4.

## 2022-09-20 ENCOUNTER — Other Ambulatory Visit: Payer: Self-pay

## 2022-09-20 ENCOUNTER — Other Ambulatory Visit (HOSPITAL_COMMUNITY): Payer: Self-pay

## 2022-09-20 ENCOUNTER — Telehealth: Payer: Self-pay | Admitting: Internal Medicine

## 2022-09-20 MED ORDER — TRAMADOL HCL 50 MG PO TABS
50.0000 mg | ORAL_TABLET | Freq: Three times a day (TID) | ORAL | 0 refills | Status: DC | PRN
  Filled 2022-09-20: qty 90, 30d supply, fill #0

## 2022-09-20 NOTE — Telephone Encounter (Signed)
F/u   Upcoming appt on 10/30/22 @ 4pm    1. Which medications need to be refilled? (please list name of each medication and dose if known) traMADol (ULTRAM) 50 MG tablet   2. Which pharmacy/location (including street and city if local pharmacy) is medication to be sent to?[place] [facility]   3. Do they need a 30 day or 90 day supply? 30 day supply

## 2022-09-24 ENCOUNTER — Other Ambulatory Visit: Payer: Self-pay

## 2022-09-24 DIAGNOSIS — R300 Dysuria: Secondary | ICD-10-CM | POA: Diagnosis not present

## 2022-09-24 DIAGNOSIS — Z803 Family history of malignant neoplasm of breast: Secondary | ICD-10-CM | POA: Diagnosis not present

## 2022-09-24 DIAGNOSIS — Z124 Encounter for screening for malignant neoplasm of cervix: Secondary | ICD-10-CM | POA: Diagnosis not present

## 2022-09-24 DIAGNOSIS — Z01419 Encounter for gynecological examination (general) (routine) without abnormal findings: Secondary | ICD-10-CM | POA: Diagnosis not present

## 2022-09-27 ENCOUNTER — Other Ambulatory Visit (HOSPITAL_COMMUNITY): Payer: Self-pay

## 2022-09-27 ENCOUNTER — Other Ambulatory Visit: Payer: Self-pay

## 2022-10-01 ENCOUNTER — Encounter: Payer: Self-pay | Admitting: Internal Medicine

## 2022-10-01 ENCOUNTER — Other Ambulatory Visit (HOSPITAL_COMMUNITY): Payer: Self-pay

## 2022-10-01 ENCOUNTER — Other Ambulatory Visit: Payer: Self-pay | Admitting: Internal Medicine

## 2022-10-01 MED ORDER — LISDEXAMFETAMINE DIMESYLATE 30 MG PO CAPS
30.0000 mg | ORAL_CAPSULE | Freq: Every morning | ORAL | 0 refills | Status: DC
  Filled 2022-10-01: qty 30, 30d supply, fill #0

## 2022-10-01 NOTE — Telephone Encounter (Signed)
Refilled: 02/11/2022 Last OV: 03/05/2022 Next OV: 10/30/2022

## 2022-10-04 ENCOUNTER — Other Ambulatory Visit (HOSPITAL_COMMUNITY): Payer: Self-pay

## 2022-10-04 MED ORDER — TRAMADOL HCL 50 MG PO TABS
50.0000 mg | ORAL_TABLET | Freq: Two times a day (BID) | ORAL | 0 refills | Status: AC | PRN
  Filled 2022-10-04: qty 14, 7d supply, fill #0

## 2022-10-04 NOTE — Telephone Encounter (Signed)
First available appt is 10/14/2022 at 4:30.

## 2022-10-04 NOTE — Telephone Encounter (Signed)
You can give her that may 13 appt

## 2022-10-07 ENCOUNTER — Other Ambulatory Visit: Payer: Self-pay

## 2022-10-10 ENCOUNTER — Other Ambulatory Visit (HOSPITAL_COMMUNITY): Payer: Self-pay

## 2022-10-11 ENCOUNTER — Other Ambulatory Visit: Payer: Self-pay

## 2022-10-11 ENCOUNTER — Other Ambulatory Visit (HOSPITAL_COMMUNITY): Payer: Self-pay

## 2022-10-11 MED ORDER — IBUPROFEN 800 MG PO TABS
800.0000 mg | ORAL_TABLET | Freq: Three times a day (TID) | ORAL | 4 refills | Status: DC | PRN
  Filled 2022-10-11: qty 90, 30d supply, fill #0

## 2022-10-14 ENCOUNTER — Other Ambulatory Visit: Payer: Self-pay

## 2022-10-14 ENCOUNTER — Ambulatory Visit (INDEPENDENT_AMBULATORY_CARE_PROVIDER_SITE_OTHER): Payer: Commercial Managed Care - PPO | Admitting: Internal Medicine

## 2022-10-14 ENCOUNTER — Other Ambulatory Visit (HOSPITAL_COMMUNITY): Payer: Self-pay

## 2022-10-14 DIAGNOSIS — M545 Low back pain, unspecified: Secondary | ICD-10-CM

## 2022-10-14 MED ORDER — GABAPENTIN 600 MG PO TABS
600.0000 mg | ORAL_TABLET | Freq: Three times a day (TID) | ORAL | 0 refills | Status: DC
  Filled 2022-10-14: qty 60, 20d supply, fill #0

## 2022-10-14 NOTE — Assessment & Plan Note (Signed)
Patient failed to keep scheduled appointment after a courtesy refill of tramadol was given and will be charged a no show fee.

## 2022-10-14 NOTE — Progress Notes (Unsigned)
Patient failed to keep scheduled appointment and will be charged a no show fee.  F

## 2022-10-15 ENCOUNTER — Other Ambulatory Visit (HOSPITAL_COMMUNITY): Payer: Self-pay

## 2022-10-15 MED ORDER — LISDEXAMFETAMINE DIMESYLATE 30 MG PO CAPS
30.0000 mg | ORAL_CAPSULE | Freq: Two times a day (BID) | ORAL | 0 refills | Status: DC
  Filled 2022-10-15: qty 60, 30d supply, fill #0

## 2022-10-16 ENCOUNTER — Ambulatory Visit: Payer: Commercial Managed Care - PPO | Admitting: Internal Medicine

## 2022-10-16 ENCOUNTER — Encounter: Payer: Self-pay | Admitting: Internal Medicine

## 2022-10-16 VITALS — BP 110/72 | HR 101 | Temp 98.1°F | Ht 67.0 in | Wt 218.8 lb

## 2022-10-16 DIAGNOSIS — Z79899 Other long term (current) drug therapy: Secondary | ICD-10-CM

## 2022-10-16 DIAGNOSIS — M545 Low back pain, unspecified: Secondary | ICD-10-CM

## 2022-10-16 DIAGNOSIS — E781 Pure hyperglyceridemia: Secondary | ICD-10-CM | POA: Diagnosis not present

## 2022-10-16 DIAGNOSIS — F5105 Insomnia due to other mental disorder: Secondary | ICD-10-CM | POA: Diagnosis not present

## 2022-10-16 DIAGNOSIS — E063 Autoimmune thyroiditis: Secondary | ICD-10-CM | POA: Diagnosis not present

## 2022-10-16 DIAGNOSIS — F419 Anxiety disorder, unspecified: Secondary | ICD-10-CM

## 2022-10-16 DIAGNOSIS — M064 Inflammatory polyarthropathy: Secondary | ICD-10-CM

## 2022-10-16 MED ORDER — CELECOXIB 200 MG PO CAPS
200.0000 mg | ORAL_CAPSULE | Freq: Every day | ORAL | 2 refills | Status: DC
  Filled 2022-10-16: qty 30, 30d supply, fill #0
  Filled 2022-11-11: qty 30, 30d supply, fill #1

## 2022-10-16 MED ORDER — TRAMADOL HCL 50 MG PO TABS
50.0000 mg | ORAL_TABLET | Freq: Two times a day (BID) | ORAL | 5 refills | Status: DC | PRN
  Filled 2022-10-16: qty 60, 30d supply, fill #0
  Filled 2022-11-21: qty 60, 30d supply, fill #1
  Filled 2022-12-30: qty 60, 30d supply, fill #2
  Filled 2023-02-16: qty 60, 30d supply, fill #3
  Filled 2023-04-10: qty 60, 30d supply, fill #4

## 2022-10-16 NOTE — Patient Instructions (Signed)
Trial of celebrex once daily for joint pain   Tramadol refilled  Labs at [facility] only 4 hours of fasting needed .    Referral to [facility] in process

## 2022-10-16 NOTE — Progress Notes (Signed)
Subjective:  Patient ID: Sonia Holden, female    DOB: 02-17-1986  Age: 37 y.o. MRN: [id number]  CC: The primary encounter diagnosis was Hypertriglyceridemia. Diagnoses of Polyarthritis, inflammatory (HCC), Acquired autoimmune hypothyroidism, and Long-term use of high-risk medication were also pertinent to this visit.   HPI Sonia Holden presents for  Chief Complaint  Patient presents with   Medical Management of Chronic Issues    Medication refills   37 yr old female with Chronic pain, multiple etiologies, including  lumbar disk herniation (lumbar), inflammatory arthritis,  bilateral OA of knees ,  presents for medication refill  for the last several years her pain has been managed with Gabapentin and tramadol .  She has increased her gabapentin in an effort to reduce her tramadol use but reports inadequate relief.  She has not had a refill on the tramadol for several months  but has not been seen in office in over 6 moths and was given #14 Refill history confirmed via [place] Controlled Substance databas, accessed by me today..  Outpatient Medications Prior to Visit  Medication Sig Dispense Refill   ALPRAZolam (XANAX) 0.5 MG tablet Take 1 tablet (0.5 mg total) by mouth daily as needed for anxiety 30 tablet 3   ASPIRIN 81 PO Take by mouth.     clindamycin (CLEOCIN T) 1 % external solution Apply 1 Application topically every morning to face for acne 60 mL 11   cyclobenzaprine (FLEXERIL) 10 MG tablet Take 1 tablet (10 mg total) by mouth 3 (three) times daily as needed for muscle spasms. 30 tablet 1   Dapsone (ACZONE) 7.5 % GEL Apply 1 Application topically in the morning to face for acne 60 g 11   doxepin (SINEQUAN) 50 MG capsule Take 1-2 capsules (50-100 mg total) by mouth at bedtime. 60 capsule 3   famotidine (PEPCID) 20 MG tablet Take 1 tablet by mouth 2 times a day (Patient taking differently: Take 20 mg by mouth as needed.) 180 tablet 1   gabapentin (NEURONTIN) 600 MG  tablet Take 1 tablet (600 mg total) by mouth 3 (three) times daily. 60 tablet 0   hydroxychloroquine (PLAQUENIL) 200 MG tablet Take 1 tablet (200 mg total) by mouth 2 (two) times daily Monday-Friday. 120 tablet 0   ibuprofen (ADVIL) 800 MG tablet Take 1 tablet (800 mg total) by mouth every 8 (eight) hours as needed. 90 tablet 4   levothyroxine (SYNTHROID) 50 MCG tablet Take 1 tablet (50 mcg total) by mouth daily. 90 tablet 0   lisdexamfetamine (VYVANSE) 30 MG capsule Take 1 capsule (30 mg total) by mouth 2 (two) times daily. 60 capsule 0   mometasone (ELOCON) 0.1 % cream Apply 1 Application topically as directed. Up to 2 weeks as needed for ezema flares. Avoid face, groin, axilla. 45 g 1   Multiple Vitamin (MULTIVITAMIN PO) Take by mouth.     tretinoin (RETIN-A) 0.025 % cream Apply topically at bedtime to face for acne as tolerated 45 g 6   valACYclovir (VALTREX) 1000 MG tablet TAKE 2 TABLETS BY MOUTH AT ONSET OF SYMPTOMS OF COLD SORES THEN 2 TABLETS 12 HOURS LATER AS DIRECTED. 30 tablet 0   zolpidem (AMBIEN CR) 12.5 MG CR tablet Take 1 tablet (12.5 mg total) by mouth at bedtime as needed for sleep 30 tablet 2   No facility-administered medications prior to visit.    Review of Systems;  Patient denies headache, fevers, malaise, unintentional weight loss, skin rash, eye pain, sinus congestion and  sinus pain, sore throat, dysphagia,  hemoptysis , cough, dyspnea, wheezing, chest pain, palpitations, orthopnea, edema, abdominal pain, nausea, melena, diarrhea, constipation, flank pain, dysuria, hematuria, urinary  Frequency, nocturia, numbness, tingling, seizures,  Focal weakness, Loss of consciousness,  Tremor, insomnia, depression, anxiety, and suicidal ideation.      Objective:  BP 110/72   Pulse (!) 101   Temp 98.1 F (36.7 C) (Oral)   Ht 5\' 7"  (1.702 m)   Wt 218 lb 12.8 oz (99.2 kg)   SpO2 97%   BMI 34.27 kg/m   BP Readings from Last 3 Encounters:  10/16/22 110/72  09/19/22 122/85   08/14/22 120/86    Wt Readings from Last 3 Encounters:  10/16/22 218 lb 12.8 oz (99.2 kg)  08/14/22 213 lb (96.6 kg)  06/20/22 216 lb (98 kg)    Physical Exam Vitals reviewed.  Constitutional:      General: She is not in acute distress.    Appearance: Normal appearance. She is normal weight. She is not ill-appearing, toxic-appearing or diaphoretic.  HENT:     Head: Normocephalic.  Eyes:     General: No scleral icterus.       Right eye: No discharge.        Left eye: No discharge.     Conjunctiva/sclera: Conjunctivae normal.  Cardiovascular:     Rate and Rhythm: Normal rate and regular rhythm.     Heart sounds: Normal heart sounds.  Pulmonary:     Effort: Pulmonary effort is normal. No respiratory distress.     Breath sounds: Normal breath sounds.  Musculoskeletal:        General: Normal range of motion.  Skin:    General: Skin is warm and dry.  Neurological:     General: No focal deficit present.     Mental Status: She is alert and oriented to person, place, and time. Mental status is at baseline.  Psychiatric:        Mood and Affect: Mood normal.        Behavior: Behavior normal.        Thought Content: Thought content normal.        Judgment: Judgment normal.   Lab Results  Component Value Date   HGBA1C 5.4 11/02/2021   HGBA1C 5.4 01/13/2018   HGBA1C 5.5 11/13/2016    Lab Results  Component Value Date   CREATININE 0.64 06/20/2022   CREATININE 0.74 03/06/2022   CREATININE 0.81 02/28/2022    Lab Results  Component Value Date   WBC 13.5 (H) 06/20/2022   HGB 15.1 06/20/2022   HCT 43.1 06/20/2022   PLT 406 (H) 06/20/2022   GLUCOSE 78 06/20/2022   CHOL 209 (H) 11/02/2021   TRIG 101.0 11/02/2021   HDL 52.00 11/02/2021   LDLDIRECT 121.0 01/13/2018   LDLCALC 137 (H) 11/02/2021   ALT 16 06/20/2022   AST 14 06/20/2022   NA 138 06/20/2022   K 4.7 06/20/2022   CL 104 06/20/2022   CREATININE 0.64 06/20/2022   BUN 13 06/20/2022   CO2 22 06/20/2022   TSH  10.56 (H) 02/28/2022   INR 1.0 03/10/2019   HGBA1C 5.4 11/02/2021    US SOFT TISSUE HEAD & NECK (NON-THYROID)  Result Date: 08/21/2022 CLINICAL DATA:  Swollen lymph node inferior to the left ear. EXAM: ULTRASOUND OF HEAD/NECK SOFT TISSUES TECHNIQUE: Ultrasound examination of the head and neck soft tissues was performed in the area of clinical concern. COMPARISON:  None Available. FINDINGS: Sonographic evaluation of patient's palpable  area of concern inferior to the left ear correlates with a benign-appearing subcutaneous cervical lymph node. The lymph node is not enlarged by size criteria measuring 0.4 cm in greatest short axis diameter and maintains a benign fatty hilum (image 5). Otherwise, there is no sonographic correlate for patient's palpable area of concern. Specifically, no solid discrete solid or cystic lesions. IMPRESSION: Sonographic evaluation of patient's palpable area of concern inferior to the left ear correlates with a benign-appearing cervical lymph node, presumably reactive in etiology. Electronically Signed   By: Simonne Come M.D.   On: 08/21/2022 13:20    Assessment & Plan:  .Hypertriglyceridemia -     Lipid panel; Future -     Comprehensive metabolic panel; Future -     LDL cholesterol, direct; Future  Polyarthritis, inflammatory (HCC) -     CBC with Differential/Platelet; Future  Acquired autoimmune hypothyroidism -     TSH; Future  Long-term use of high-risk medication -     Ambulatory referral to Ophthalmology  Other orders -     Celecoxib; Take 1 capsule (200 mg total) by mouth daily.  Dispense: 30 capsule; Refill: 2 -     traMADol HCl; Take 1 tablet (50 mg total) by mouth every 12 (twelve) hours as needed.  Dispense: 60 tablet; Refill: 5     I provided 34 minutes of face-to-face time during this encounter reviewing patient's last visit with me, patient's  most recent visit with rheumatology,  recent surgical and non surgical procedures, previous  labs and  imaging studies, counseling on currently addressed issues,  and post visit ordering to diagnostics and therapeutics .   Follow-up: No follow-ups on file.   Sherlene Shams, MD

## 2022-10-17 ENCOUNTER — Other Ambulatory Visit (HOSPITAL_COMMUNITY): Payer: Self-pay

## 2022-10-17 ENCOUNTER — Other Ambulatory Visit: Payer: Self-pay

## 2022-10-19 NOTE — Assessment & Plan Note (Signed)
Managed with ambien cr and doxepin

## 2022-10-19 NOTE — Assessment & Plan Note (Addendum)
Patient is managing her back pain with  tramadol  and gabapentin .  Adding celebrex

## 2022-10-20 ENCOUNTER — Other Ambulatory Visit: Payer: Self-pay | Admitting: Internal Medicine

## 2022-10-21 ENCOUNTER — Other Ambulatory Visit (HOSPITAL_COMMUNITY): Payer: Self-pay

## 2022-10-21 ENCOUNTER — Other Ambulatory Visit: Payer: Self-pay

## 2022-10-21 MED ORDER — LEVOTHYROXINE SODIUM 50 MCG PO TABS
50.0000 ug | ORAL_TABLET | Freq: Every day | ORAL | 0 refills | Status: DC
  Filled 2022-10-21: qty 90, 90d supply, fill #0

## 2022-10-22 ENCOUNTER — Other Ambulatory Visit: Payer: Self-pay

## 2022-10-22 ENCOUNTER — Other Ambulatory Visit (HOSPITAL_COMMUNITY): Payer: Self-pay

## 2022-10-22 DIAGNOSIS — M1711 Unilateral primary osteoarthritis, right knee: Secondary | ICD-10-CM | POA: Diagnosis not present

## 2022-10-26 ENCOUNTER — Other Ambulatory Visit (HOSPITAL_COMMUNITY): Payer: Self-pay

## 2022-10-29 ENCOUNTER — Other Ambulatory Visit: Payer: Self-pay

## 2022-10-29 ENCOUNTER — Telehealth: Payer: Self-pay

## 2022-10-29 NOTE — Telephone Encounter (Signed)
LMTCB. Please transfer to Shanda Bumps, CMA when she returns call.

## 2022-10-30 ENCOUNTER — Ambulatory Visit: Payer: Commercial Managed Care - PPO | Admitting: Internal Medicine
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
## 2022-11-06 DIAGNOSIS — F4000 Agoraphobia, unspecified: Secondary | ICD-10-CM | POA: Diagnosis not present

## 2022-11-06 DIAGNOSIS — F919 Conduct disorder, unspecified: Secondary | ICD-10-CM | POA: Diagnosis not present

## 2022-11-06 DIAGNOSIS — F329 Major depressive disorder, single episode, unspecified: Secondary | ICD-10-CM | POA: Diagnosis not present

## 2022-11-11 ENCOUNTER — Other Ambulatory Visit (HOSPITAL_COMMUNITY): Payer: Self-pay

## 2022-11-11 ENCOUNTER — Other Ambulatory Visit: Payer: Self-pay

## 2022-11-11 ENCOUNTER — Other Ambulatory Visit: Payer: Self-pay | Admitting: Internal Medicine

## 2022-11-11 MED ORDER — CYCLOBENZAPRINE HCL 10 MG PO TABS
10.0000 mg | ORAL_TABLET | Freq: Three times a day (TID) | ORAL | 5 refills | Status: DC | PRN
  Filled 2022-11-11: qty 30, 10d supply, fill #0
  Filled 2023-01-06: qty 30, 10d supply, fill #1
  Filled 2023-02-16: qty 30, 10d supply, fill #2
  Filled 2023-04-10: qty 30, 10d supply, fill #3
  Filled 2023-07-16: qty 30, 10d supply, fill #4
  Filled 2023-08-28: qty 30, 10d supply, fill #5

## 2022-11-12 ENCOUNTER — Other Ambulatory Visit: Payer: Self-pay

## 2022-11-13 ENCOUNTER — Other Ambulatory Visit (HOSPITAL_COMMUNITY): Payer: Self-pay

## 2022-11-14 ENCOUNTER — Other Ambulatory Visit (HOSPITAL_COMMUNITY): Payer: Self-pay

## 2022-11-14 ENCOUNTER — Ambulatory Visit: Payer: Commercial Managed Care - PPO | Admitting: Physician Assistant

## 2022-11-14 DIAGNOSIS — M1711 Unilateral primary osteoarthritis, right knee: Secondary | ICD-10-CM

## 2022-11-14 DIAGNOSIS — S83511A Sprain of anterior cruciate ligament of right knee, initial encounter: Secondary | ICD-10-CM

## 2022-11-14 DIAGNOSIS — R768 Other specified abnormal immunological findings in serum: Secondary | ICD-10-CM

## 2022-11-14 DIAGNOSIS — M79641 Pain in right hand: Secondary | ICD-10-CM

## 2022-11-14 DIAGNOSIS — F419 Anxiety disorder, unspecified: Secondary | ICD-10-CM

## 2022-11-14 DIAGNOSIS — Z79899 Other long term (current) drug therapy: Secondary | ICD-10-CM

## 2022-11-14 DIAGNOSIS — G8929 Other chronic pain: Secondary | ICD-10-CM

## 2022-11-14 DIAGNOSIS — E559 Vitamin D deficiency, unspecified: Secondary | ICD-10-CM

## 2022-11-14 DIAGNOSIS — F17200 Nicotine dependence, unspecified, uncomplicated: Secondary | ICD-10-CM

## 2022-11-14 DIAGNOSIS — K5909 Other constipation: Secondary | ICD-10-CM

## 2022-11-14 DIAGNOSIS — R5383 Other fatigue: Secondary | ICD-10-CM

## 2022-11-14 DIAGNOSIS — M7918 Myalgia, other site: Secondary | ICD-10-CM

## 2022-11-14 DIAGNOSIS — M1990 Unspecified osteoarthritis, unspecified site: Secondary | ICD-10-CM

## 2022-11-14 DIAGNOSIS — M545 Low back pain, unspecified: Secondary | ICD-10-CM

## 2022-11-14 DIAGNOSIS — R590 Localized enlarged lymph nodes: Secondary | ICD-10-CM

## 2022-11-14 DIAGNOSIS — Z803 Family history of malignant neoplasm of breast: Secondary | ICD-10-CM

## 2022-11-14 MED ORDER — LISDEXAMFETAMINE DIMESYLATE 30 MG PO CAPS
30.0000 mg | ORAL_CAPSULE | Freq: Two times a day (BID) | ORAL | 0 refills | Status: DC
  Filled 2022-11-14: qty 60, 30d supply, fill #0

## 2022-11-14 MED ORDER — GABAPENTIN 600 MG PO TABS
600.0000 mg | ORAL_TABLET | Freq: Three times a day (TID) | ORAL | 0 refills | Status: DC
  Filled 2022-11-14: qty 60, 20d supply, fill #0

## 2022-11-14 MED ORDER — ZOLPIDEM TARTRATE ER 12.5 MG PO TBCR
12.5000 mg | EXTENDED_RELEASE_TABLET | Freq: Every evening | ORAL | 2 refills | Status: DC | PRN
  Filled 2022-11-14: qty 30, 30d supply, fill #0
  Filled 2022-12-13: qty 30, 30d supply, fill #1
  Filled 2023-01-12: qty 30, 30d supply, fill #2

## 2022-11-19 ENCOUNTER — Other Ambulatory Visit (HOSPITAL_COMMUNITY): Payer: Self-pay

## 2022-11-19 MED ORDER — GABAPENTIN 600 MG PO TABS
600.0000 mg | ORAL_TABLET | Freq: Three times a day (TID) | ORAL | 0 refills | Status: DC
  Filled 2022-12-12: qty 60, 20d supply, fill #0

## 2022-11-20 DIAGNOSIS — R350 Frequency of micturition: Secondary | ICD-10-CM | POA: Diagnosis not present

## 2022-11-21 ENCOUNTER — Other Ambulatory Visit: Payer: Self-pay

## 2022-11-25 ENCOUNTER — Other Ambulatory Visit (HOSPITAL_COMMUNITY): Payer: Self-pay

## 2022-11-25 ENCOUNTER — Other Ambulatory Visit: Payer: Self-pay | Admitting: Internal Medicine

## 2022-11-25 MED ORDER — LEVOTHYROXINE SODIUM 50 MCG PO TABS
50.0000 ug | ORAL_TABLET | Freq: Every day | ORAL | 0 refills | Status: DC
  Filled 2022-11-25 – 2023-01-20 (×2): qty 90, 90d supply, fill #0

## 2022-11-30 ENCOUNTER — Other Ambulatory Visit: Payer: Self-pay | Admitting: Internal Medicine

## 2022-11-30 ENCOUNTER — Other Ambulatory Visit (HOSPITAL_COMMUNITY): Payer: Self-pay

## 2022-12-02 ENCOUNTER — Other Ambulatory Visit: Payer: Self-pay

## 2022-12-02 ENCOUNTER — Other Ambulatory Visit (HOSPITAL_COMMUNITY): Payer: Self-pay

## 2022-12-02 MED ORDER — FAMOTIDINE 20 MG PO TABS
20.0000 mg | ORAL_TABLET | Freq: Two times a day (BID) | ORAL | 1 refills | Status: DC
  Filled 2022-12-02: qty 180, 90d supply, fill #0
  Filled 2023-02-16: qty 180, 90d supply, fill #1

## 2022-12-03 ENCOUNTER — Other Ambulatory Visit (HOSPITAL_COMMUNITY): Payer: Self-pay

## 2022-12-03 ENCOUNTER — Other Ambulatory Visit: Payer: Self-pay

## 2022-12-04 ENCOUNTER — Other Ambulatory Visit (HOSPITAL_COMMUNITY): Payer: Self-pay
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
## 2022-12-06 ENCOUNTER — Other Ambulatory Visit (HOSPITAL_COMMUNITY): Payer: Self-pay

## 2022-12-06 ENCOUNTER — Other Ambulatory Visit: Payer: Self-pay

## 2022-12-06 MED ORDER — ALPRAZOLAM 0.5 MG PO TABS
0.5000 mg | ORAL_TABLET | Freq: Every day | ORAL | 3 refills | Status: DC | PRN
  Filled 2022-12-06 (×2): qty 30, 30d supply, fill #0
  Filled 2023-01-06: qty 30, 30d supply, fill #1
  Filled 2023-02-08: qty 30, 30d supply, fill #2
  Filled 2023-03-13: qty 30, 30d supply, fill #3

## 2022-12-12 ENCOUNTER — Other Ambulatory Visit (HOSPITAL_COMMUNITY): Payer: Self-pay

## 2022-12-12 ENCOUNTER — Encounter: Payer: Self-pay | Admitting: Internal Medicine

## 2022-12-12 ENCOUNTER — Other Ambulatory Visit: Payer: Self-pay | Admitting: Dermatology

## 2022-12-12 ENCOUNTER — Other Ambulatory Visit: Payer: Self-pay

## 2022-12-12 MED ORDER — VALACYCLOVIR HCL 1 G PO TABS
ORAL_TABLET | ORAL | 1 refills | Status: AC
  Filled 2022-12-12: qty 30, 8d supply, fill #0

## 2022-12-13 ENCOUNTER — Other Ambulatory Visit: Payer: Self-pay

## 2022-12-13 MED ORDER — LISDEXAMFETAMINE DIMESYLATE 30 MG PO CAPS
30.0000 mg | ORAL_CAPSULE | Freq: Two times a day (BID) | ORAL | 0 refills | Status: DC
  Filled 2022-12-13: qty 60, 30d supply, fill #0

## 2022-12-17 NOTE — Telephone Encounter (Signed)
 LMTCB
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
## 2022-12-19 ENCOUNTER — Ambulatory Visit: Payer: Commercial Managed Care - PPO | Admitting: Physician Assistant

## 2022-12-19 DIAGNOSIS — M7918 Myalgia, other site: Secondary | ICD-10-CM

## 2022-12-19 DIAGNOSIS — E559 Vitamin D deficiency, unspecified: Secondary | ICD-10-CM

## 2022-12-19 DIAGNOSIS — G8929 Other chronic pain: Secondary | ICD-10-CM

## 2022-12-19 DIAGNOSIS — M1990 Unspecified osteoarthritis, unspecified site: Secondary | ICD-10-CM

## 2022-12-19 DIAGNOSIS — M545 Low back pain, unspecified: Secondary | ICD-10-CM

## 2022-12-19 DIAGNOSIS — R002 Palpitations: Secondary | ICD-10-CM

## 2022-12-19 DIAGNOSIS — F419 Anxiety disorder, unspecified: Secondary | ICD-10-CM

## 2022-12-19 DIAGNOSIS — M1711 Unilateral primary osteoarthritis, right knee: Secondary | ICD-10-CM

## 2022-12-19 DIAGNOSIS — Z803 Family history of malignant neoplasm of breast: Secondary | ICD-10-CM

## 2022-12-19 DIAGNOSIS — F17200 Nicotine dependence, unspecified, uncomplicated: Secondary | ICD-10-CM

## 2022-12-19 DIAGNOSIS — Z79899 Other long term (current) drug therapy: Secondary | ICD-10-CM

## 2022-12-19 DIAGNOSIS — S83511A Sprain of anterior cruciate ligament of right knee, initial encounter: Secondary | ICD-10-CM

## 2022-12-19 DIAGNOSIS — K5909 Other constipation: Secondary | ICD-10-CM

## 2022-12-19 DIAGNOSIS — R768 Other specified abnormal immunological findings in serum: Secondary | ICD-10-CM

## 2022-12-30 ENCOUNTER — Other Ambulatory Visit (HOSPITAL_COMMUNITY): Payer: Self-pay

## 2022-12-30 ENCOUNTER — Other Ambulatory Visit: Payer: Self-pay

## 2022-12-30 ENCOUNTER — Other Ambulatory Visit: Payer: Self-pay | Admitting: Physician Assistant

## 2022-12-30 MED ORDER — HYDROXYCHLOROQUINE SULFATE 200 MG PO TABS
200.0000 mg | ORAL_TABLET | Freq: Two times a day (BID) | ORAL | 0 refills | Status: DC
  Filled 2022-12-30: qty 40, 20d supply, fill #0

## 2022-12-30 NOTE — Telephone Encounter (Signed)
Last Fill: 06/21/2022  Eye exam: No baseline plaquenil eye examination on file.    Labs: 06/20/2022 CMP WNL. WBC count remains elevated.  Absolute neutrophils are elevated.  Plt count borderline elevated-406. Rest of CBC WNL.    Next Visit: 01/07/2023  Last Visit: 08/14/2022  DX: Inflammatory arthritis   Current Dose per office note 08/14/2022: Plaquenil 200 mg 1 tablet by mouth twice daily Monday through Friday.   Patient advised we need her PLQ eye exam. Patient has her appointment for her eye exam on 01/01/2023.   Okay to refill Plaquenil?

## 2022-12-31 ENCOUNTER — Other Ambulatory Visit: Payer: Self-pay

## 2022-12-31 ENCOUNTER — Other Ambulatory Visit (HOSPITAL_COMMUNITY): Payer: Self-pay

## 2023-01-01 ENCOUNTER — Other Ambulatory Visit (HOSPITAL_COMMUNITY): Payer: Self-pay

## 2023-01-01 ENCOUNTER — Ambulatory Visit: Payer: Commercial Managed Care - PPO | Admitting: Physician Assistant

## 2023-01-01 DIAGNOSIS — M7918 Myalgia, other site: Secondary | ICD-10-CM

## 2023-01-01 DIAGNOSIS — M1711 Unilateral primary osteoarthritis, right knee: Secondary | ICD-10-CM

## 2023-01-01 DIAGNOSIS — R768 Other specified abnormal immunological findings in serum: Secondary | ICD-10-CM

## 2023-01-01 DIAGNOSIS — R002 Palpitations: Secondary | ICD-10-CM

## 2023-01-01 DIAGNOSIS — K5909 Other constipation: Secondary | ICD-10-CM

## 2023-01-01 DIAGNOSIS — F419 Anxiety disorder, unspecified: Secondary | ICD-10-CM

## 2023-01-01 DIAGNOSIS — E559 Vitamin D deficiency, unspecified: Secondary | ICD-10-CM

## 2023-01-01 DIAGNOSIS — M79642 Pain in left hand: Secondary | ICD-10-CM

## 2023-01-01 DIAGNOSIS — G8929 Other chronic pain: Secondary | ICD-10-CM

## 2023-01-01 DIAGNOSIS — S83511A Sprain of anterior cruciate ligament of right knee, initial encounter: Secondary | ICD-10-CM

## 2023-01-01 DIAGNOSIS — F17200 Nicotine dependence, unspecified, uncomplicated: Secondary | ICD-10-CM

## 2023-01-01 DIAGNOSIS — Z803 Family history of malignant neoplasm of breast: Secondary | ICD-10-CM

## 2023-01-01 DIAGNOSIS — Z79899 Other long term (current) drug therapy: Secondary | ICD-10-CM | POA: Diagnosis not present

## 2023-01-01 DIAGNOSIS — M545 Low back pain, unspecified: Secondary | ICD-10-CM

## 2023-01-01 DIAGNOSIS — M1990 Unspecified osteoarthritis, unspecified site: Secondary | ICD-10-CM

## 2023-01-01 DIAGNOSIS — H5213 Myopia, bilateral: Secondary | ICD-10-CM | POA: Diagnosis not present

## 2023-01-02 ENCOUNTER — Other Ambulatory Visit (HOSPITAL_COMMUNITY): Payer: Self-pay

## 2023-01-02 MED ORDER — DOXEPIN HCL 50 MG PO CAPS
50.0000 mg | ORAL_CAPSULE | Freq: Every day | ORAL | 3 refills | Status: DC
  Filled 2023-01-02: qty 60, 30d supply, fill #0
  Filled 2023-02-03: qty 60, 30d supply, fill #1
  Filled 2023-03-03: qty 60, 30d supply, fill #2
  Filled 2023-04-10: qty 60, 30d supply, fill #3

## 2023-01-03 ENCOUNTER — Other Ambulatory Visit (HOSPITAL_COMMUNITY): Payer: Self-pay

## 2023-01-06 ENCOUNTER — Other Ambulatory Visit (HOSPITAL_COMMUNITY): Payer: Self-pay

## 2023-01-06 ENCOUNTER — Other Ambulatory Visit: Payer: Self-pay

## 2023-01-06 MED ORDER — GABAPENTIN 600 MG PO TABS
600.0000 mg | ORAL_TABLET | Freq: Three times a day (TID) | ORAL | 0 refills | Status: DC
  Filled 2023-01-06: qty 60, 20d supply, fill #0
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
## 2023-01-07 ENCOUNTER — Ambulatory Visit: Payer: Commercial Managed Care - PPO | Attending: Physician Assistant | Admitting: Physician Assistant

## 2023-01-07 ENCOUNTER — Other Ambulatory Visit: Payer: Self-pay

## 2023-01-07 ENCOUNTER — Encounter: Payer: Self-pay | Admitting: Physician Assistant

## 2023-01-07 VITALS — BP 124/86 | HR 118 | Resp 15 | Ht 67.0 in | Wt 217.0 lb

## 2023-01-07 DIAGNOSIS — S83511A Sprain of anterior cruciate ligament of right knee, initial encounter: Secondary | ICD-10-CM | POA: Diagnosis not present

## 2023-01-07 DIAGNOSIS — R590 Localized enlarged lymph nodes: Secondary | ICD-10-CM

## 2023-01-07 DIAGNOSIS — E781 Pure hyperglyceridemia: Secondary | ICD-10-CM

## 2023-01-07 DIAGNOSIS — M1990 Unspecified osteoarthritis, unspecified site: Secondary | ICD-10-CM | POA: Diagnosis not present

## 2023-01-07 DIAGNOSIS — E063 Autoimmune thyroiditis: Secondary | ICD-10-CM | POA: Diagnosis not present

## 2023-01-07 DIAGNOSIS — F5105 Insomnia due to other mental disorder: Secondary | ICD-10-CM

## 2023-01-07 DIAGNOSIS — K5909 Other constipation: Secondary | ICD-10-CM

## 2023-01-07 DIAGNOSIS — F17200 Nicotine dependence, unspecified, uncomplicated: Secondary | ICD-10-CM

## 2023-01-07 DIAGNOSIS — F419 Anxiety disorder, unspecified: Secondary | ICD-10-CM | POA: Diagnosis not present

## 2023-01-07 DIAGNOSIS — M1711 Unilateral primary osteoarthritis, right knee: Secondary | ICD-10-CM | POA: Diagnosis not present

## 2023-01-07 DIAGNOSIS — R768 Other specified abnormal immunological findings in serum: Secondary | ICD-10-CM

## 2023-01-07 DIAGNOSIS — R002 Palpitations: Secondary | ICD-10-CM | POA: Diagnosis not present

## 2023-01-07 DIAGNOSIS — Z803 Family history of malignant neoplasm of breast: Secondary | ICD-10-CM

## 2023-01-07 DIAGNOSIS — Z79899 Other long term (current) drug therapy: Secondary | ICD-10-CM

## 2023-01-07 DIAGNOSIS — M7918 Myalgia, other site: Secondary | ICD-10-CM | POA: Diagnosis not present

## 2023-01-07 DIAGNOSIS — M545 Low back pain, unspecified: Secondary | ICD-10-CM

## 2023-01-07 DIAGNOSIS — M23611 Other spontaneous disruption of anterior cruciate ligament of right knee: Secondary | ICD-10-CM

## 2023-01-07 DIAGNOSIS — E559 Vitamin D deficiency, unspecified: Secondary | ICD-10-CM

## 2023-01-07 DIAGNOSIS — M064 Inflammatory polyarthropathy: Secondary | ICD-10-CM

## 2023-01-07 DIAGNOSIS — R5383 Other fatigue: Secondary | ICD-10-CM

## 2023-01-07 LAB — CBC WITH DIFFERENTIAL/PLATELET
Absolute Monocytes: 695 cells/uL (ref 200–950)
Basophils Absolute: 56 cells/uL (ref 0–200)
Basophils Relative: 0.4 %
Eosinophils Absolute: 111 cells/uL (ref 15–500)
Eosinophils Relative: 0.8 %
HCT: 45.5 % — ABNORMAL HIGH (ref 35.0–45.0)
Hemoglobin: 15.6 g/dL — ABNORMAL HIGH (ref 11.7–15.5)
Lymphs Abs: 3781 cells/uL (ref 850–3900)
MCH: 30.1 pg (ref 27.0–33.0)
MCHC: 34.3 g/dL (ref 32.0–36.0)
MCV: 87.8 fL (ref 80.0–100.0)
MPV: 9.9 fL (ref 7.5–12.5)
Monocytes Relative: 5 %
Neutro Abs: 9257 cells/uL — ABNORMAL HIGH (ref 1500–7800)
Neutrophils Relative %: 66.6 %
Platelets: 419 10*3/uL — ABNORMAL HIGH (ref 140–400)
RBC: 5.18 10*6/uL — ABNORMAL HIGH (ref 3.80–5.10)
RDW: 12.7 % (ref 11.0–15.0)
Total Lymphocyte: 27.2 %
WBC: 13.9 10*3/uL — ABNORMAL HIGH (ref 3.8–10.8)

## 2023-01-08 ENCOUNTER — Encounter: Payer: Self-pay | Admitting: Internal Medicine

## 2023-01-08 ENCOUNTER — Other Ambulatory Visit: Payer: Self-pay | Admitting: Internal Medicine

## 2023-01-08 DIAGNOSIS — D72829 Elevated white blood cell count, unspecified: Secondary | ICD-10-CM

## 2023-01-08 NOTE — Progress Notes (Signed)
Reviewed with Dr. Marcello Moores an eval by hematology due to chronicity of leukocytosis

## 2023-01-08 NOTE — Progress Notes (Signed)
TSH WNL. Total cholesterol is elevated-248. TG elevated and LDL elevated. Direct LDL is pending. Please forward results to PCP as requested by the patient.  CMP WNL WBC count, RBC count, hemoglobin, hematocrit, and platelet count are elevated. Please clarify if she has seen a hematologist in the past?

## 2023-01-12 ENCOUNTER — Other Ambulatory Visit: Payer: Self-pay

## 2023-01-12 ENCOUNTER — Other Ambulatory Visit (HOSPITAL_COMMUNITY): Payer: Self-pay

## 2023-01-13 ENCOUNTER — Other Ambulatory Visit: Payer: Self-pay

## 2023-01-14 ENCOUNTER — Encounter: Payer: Self-pay | Admitting: Oncology

## 2023-01-14 ENCOUNTER — Inpatient Hospital Stay: Payer: Commercial Managed Care - PPO | Admitting: Oncology

## 2023-01-14 ENCOUNTER — Inpatient Hospital Stay: Payer: Commercial Managed Care - PPO

## 2023-01-14 VITALS — BP 129/79 | HR 104 | Temp 98.2°F | Resp 18 | Wt 225.5 lb

## 2023-01-14 DIAGNOSIS — M069 Rheumatoid arthritis, unspecified: Secondary | ICD-10-CM | POA: Diagnosis not present

## 2023-01-14 DIAGNOSIS — D72829 Elevated white blood cell count, unspecified: Secondary | ICD-10-CM | POA: Diagnosis not present

## 2023-01-14 DIAGNOSIS — F17210 Nicotine dependence, cigarettes, uncomplicated: Secondary | ICD-10-CM | POA: Diagnosis not present

## 2023-01-14 DIAGNOSIS — Z720 Tobacco use: Secondary | ICD-10-CM | POA: Insufficient documentation

## 2023-01-14 DIAGNOSIS — D75839 Thrombocytosis, unspecified: Secondary | ICD-10-CM | POA: Insufficient documentation

## 2023-01-14 DIAGNOSIS — D751 Secondary polycythemia: Secondary | ICD-10-CM | POA: Diagnosis not present

## 2023-01-14 DIAGNOSIS — Z803 Family history of malignant neoplasm of breast: Secondary | ICD-10-CM | POA: Insufficient documentation

## 2023-01-14 LAB — CBC WITH DIFFERENTIAL/PLATELET
Abs Immature Granulocytes: 0.04 10*3/uL (ref 0.00–0.07)
Basophils Absolute: 0.1 10*3/uL (ref 0.0–0.1)
Basophils Relative: 1 %
Eosinophils Absolute: 0.4 10*3/uL (ref 0.0–0.5)
Eosinophils Relative: 2 %
HCT: 45.6 % (ref 36.0–46.0)
Hemoglobin: 15.1 g/dL — ABNORMAL HIGH (ref 12.0–15.0)
Immature Granulocytes: 0 %
Lymphocytes Relative: 21 %
Lymphs Abs: 3.4 10*3/uL (ref 0.7–4.0)
MCH: 29.7 pg (ref 26.0–34.0)
MCHC: 33.1 g/dL (ref 30.0–36.0)
MCV: 89.8 fL (ref 80.0–100.0)
Monocytes Absolute: 1 10*3/uL (ref 0.1–1.0)
Monocytes Relative: 6 %
Neutro Abs: 11.4 10*3/uL — ABNORMAL HIGH (ref 1.7–7.7)
Neutrophils Relative %: 70 %
Platelets: 369 10*3/uL (ref 150–400)
RBC: 5.08 MIL/uL (ref 3.87–5.11)
RDW: 14 % (ref 11.5–15.5)
WBC: 16.3 10*3/uL — ABNORMAL HIGH (ref 4.0–10.5)
nRBC: 0 % (ref 0.0–0.2)

## 2023-01-14 LAB — IRON AND TIBC
Iron: 55 ug/dL (ref 28–170)
Saturation Ratios: 12 % (ref 10.4–31.8)
TIBC: 449 ug/dL (ref 250–450)
UIBC: 394 ug/dL

## 2023-01-14 LAB — RETIC PANEL
Immature Retic Fract: 8.6 % (ref 2.3–15.9)
RBC.: 5.02 MIL/uL (ref 3.87–5.11)
Retic Count, Absolute: 97.4 10*3/uL (ref 19.0–186.0)
Retic Ct Pct: 1.9 % (ref 0.4–3.1)
Reticulocyte Hemoglobin: 32.8 pg (ref >27.9)

## 2023-01-14 LAB — LACTATE DEHYDROGENASE: LDH: 133 U/L (ref 98–192)

## 2023-01-14 LAB — FERRITIN: Ferritin: 12 ng/mL (ref 11–307)

## 2023-01-14 NOTE — Assessment & Plan Note (Signed)
Recommend smoke cessation.  

## 2023-01-14 NOTE — Assessment & Plan Note (Signed)
Labs reviewed and discussed with patient that Leukocytosis, differential diagnosis is broad, acute or chronic infection and inflammation, smoking, autoimmune disease, or underlying bone marrow disorders.   Leukocytosis likely reactive, secondary to smoking.  Out other etiologies. For the work up of patient's leukocytosis, I recommend checking CBC;CMP, LDH, smear review, peripheral flowcytometry, hepatitis, HIV, monoclonal gammopathy workup.

## 2023-01-14 NOTE — Assessment & Plan Note (Signed)
Likely reactive, secondary to smoking.  Pending above workup.
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
## 2023-01-14 NOTE — Assessment & Plan Note (Signed)
Likely reactive, secondary to smoking. Check BCR-ABL FISH and JAK2 mutation with reflex.

## 2023-01-14 NOTE — Progress Notes (Signed)
Pt here to establish care. Pt reports that she has vaginal and right leg numbness. She will be seeing neurology for this.

## 2023-01-15 LAB — HEPATITIS PANEL, ACUTE
HCV Ab: NONREACTIVE
Hep A IgM: NONREACTIVE
Hep B C IgM: NONREACTIVE
Hepatitis B Surface Ag: NONREACTIVE

## 2023-01-16 ENCOUNTER — Other Ambulatory Visit (HOSPITAL_COMMUNITY): Payer: Self-pay

## 2023-01-16 MED ORDER — LISDEXAMFETAMINE DIMESYLATE 30 MG PO CAPS
30.0000 mg | ORAL_CAPSULE | Freq: Two times a day (BID) | ORAL | 0 refills | Status: DC
  Filled 2023-01-16: qty 60, 30d supply, fill #0

## 2023-01-17 ENCOUNTER — Emergency Department (HOSPITAL_BASED_OUTPATIENT_CLINIC_OR_DEPARTMENT_OTHER)
Admission: EM | Admit: 2023-01-17 | Discharge: 2023-01-17 | Disposition: A | Discharge status: Home / Self Care | Payer: Commercial Managed Care - PPO | Attending: Emergency Medicine | Admitting: Emergency Medicine

## 2023-01-17 ENCOUNTER — Encounter (HOSPITAL_BASED_OUTPATIENT_CLINIC_OR_DEPARTMENT_OTHER): Payer: Self-pay | Admitting: Emergency Medicine

## 2023-01-17 ENCOUNTER — Emergency Department (HOSPITAL_BASED_OUTPATIENT_CLINIC_OR_DEPARTMENT_OTHER): Payer: Commercial Managed Care - PPO

## 2023-01-17 ENCOUNTER — Emergency Department (HOSPITAL_BASED_OUTPATIENT_CLINIC_OR_DEPARTMENT_OTHER): Payer: Commercial Managed Care - PPO | Admitting: Radiology

## 2023-01-17 ENCOUNTER — Other Ambulatory Visit: Payer: Self-pay

## 2023-01-17 DIAGNOSIS — R918 Other nonspecific abnormal finding of lung field: Secondary | ICD-10-CM | POA: Diagnosis not present

## 2023-01-17 DIAGNOSIS — J984 Other disorders of lung: Secondary | ICD-10-CM | POA: Diagnosis not present

## 2023-01-17 DIAGNOSIS — E039 Hypothyroidism, unspecified: Secondary | ICD-10-CM | POA: Insufficient documentation

## 2023-01-17 DIAGNOSIS — M546 Pain in thoracic spine: Secondary | ICD-10-CM | POA: Diagnosis not present

## 2023-01-17 DIAGNOSIS — M25512 Pain in left shoulder: Secondary | ICD-10-CM | POA: Diagnosis not present

## 2023-01-17 DIAGNOSIS — R0602 Shortness of breath: Secondary | ICD-10-CM | POA: Diagnosis not present

## 2023-01-17 DIAGNOSIS — Z79899 Other long term (current) drug therapy: Secondary | ICD-10-CM | POA: Insufficient documentation

## 2023-01-17 DIAGNOSIS — R000 Tachycardia, unspecified: Secondary | ICD-10-CM | POA: Diagnosis not present

## 2023-01-17 LAB — CBC
HCT: 47.4 % — ABNORMAL HIGH (ref 36.0–46.0)
Hemoglobin: 16.1 g/dL — ABNORMAL HIGH (ref 12.0–15.0)
MCH: 29.9 pg (ref 26.0–34.0)
MCHC: 34 g/dL (ref 30.0–36.0)
MCV: 87.9 fL (ref 80.0–100.0)
Platelets: 354 10*3/uL (ref 150–400)
RBC: 5.39 MIL/uL — ABNORMAL HIGH (ref 3.87–5.11)
RDW: 14.2 % (ref 11.5–15.5)
WBC: 13.4 10*3/uL — ABNORMAL HIGH (ref 4.0–10.5)
nRBC: 0 % (ref 0.0–0.2)

## 2023-01-17 LAB — BASIC METABOLIC PANEL
Anion gap: 9 (ref 5–15)
BUN: 13 mg/dL (ref 6–20)
CO2: 24 mmol/L (ref 22–32)
Calcium: 9.5 mg/dL (ref 8.9–10.3)
Chloride: 105 mmol/L (ref 98–111)
Creatinine, Ser: 0.59 mg/dL (ref 0.44–1.00)
GFR, Estimated: >60 mL/min (ref >60)
Glucose, Bld: 79 mg/dL (ref 70–99)
Potassium: 3.7 mmol/L (ref 3.5–5.1)
Sodium: 138 mmol/L (ref 135–145)

## 2023-01-17 LAB — PREGNANCY, URINE: Preg Test, Ur: NEGATIVE

## 2023-01-17 LAB — COMP PANEL: LEUKEMIA/LYMPHOMA

## 2023-01-17 LAB — TROPONIN I (HIGH SENSITIVITY): Troponin I (High Sensitivity): <2 ng/L (ref <18)

## 2023-01-17 MED ORDER — IOHEXOL 350 MG/ML SOLN
100.0000 mL | Freq: Once | INTRAVENOUS | Status: AC | PRN
  Administered 2023-01-17: 75 mL via INTRAVENOUS

## 2023-01-17 NOTE — Discharge Instructions (Addendum)
The CAT scan today was normal without signs of any aortic dissection, pulmonary embolism, pneumonia or other acute findings.  You do have any wheezing at this time.  Your white blood cell count has improved and is now 13.  Continue to follow-up with your doctor.  Return if you start having a fever shortness of breath gets worse.

## 2023-01-17 NOTE — ED Triage Notes (Signed)
Pt c/o SOB and left shoulder blade pain x 2 days

## 2023-01-17 NOTE — ED Provider Notes (Signed)
[place] EMERGENCY DEPARTMENT AT [facility] Provider Note   CSN: [id number] Arrival date & time: 01/17/23  1608     History  Chief Complaint  Patient presents with   Shortness of Breath    Sonia Holden is a 37 y.o. female.  Patient is a 37 year old female with a history of RA, hypothyroidism on Synthroid, hypertriglyceridemia, ADHD who is presenting today with complaint of 2 days of soreness in her upper back which is worse when she takes a deep breath and shortness of breath which she especially notices it when she is walking but is somewhat positional as well.  She has not had cough, fever, chest pain.  No abdominal pain nausea or vomiting.  No unilateral leg pain or swelling.  She does report there are multiple family members who have had blood clots in the past.  She is not on any OCPs but does use tobacco products.  She denies history of similar symptoms.  She has never had asthma or bronchitis or has to use an inhaler.  The history is provided by the patient.  Shortness of Breath      Home Medications Prior to Admission medications   Medication Sig Start Date End Date Taking? Authorizing Provider  ALPRAZolam Prudy Feeler) 0.5 MG tablet Take 1 tablet (0.5 mg total) by mouth daily as needed for anxiety. 12/04/22     ASPIRIN 81 PO Take by mouth.    [provider]  clindamycin (CLEOCIN T) 1 % external solution Apply 1 Application topically every morning to face for acne 09/19/22   [facility], [place], MD  cyclobenzaprine (FLEXERIL) 10 MG tablet Take 1 tablet (10 mg total) by mouth 3 (three) times daily as needed for muscle spasms. 11/11/22   Sherlene Shams, MD  Dapsone (ACZONE) 7.5 % GEL Apply 1 Application topically in the morning to face for acne 09/19/22   [facility], [place], MD  doxepin (SINEQUAN) 50 MG capsule Take 1-2 capsules (50-100 mg total) by mouth at bedtime. 12/31/22     famotidine (PEPCID) 20 MG tablet Take 1 tablet (20 mg total) by mouth 2 (two) times  daily. 12/02/22   Sherlene Shams, MD  gabapentin (NEURONTIN) 600 MG tablet Take 1 tablet (600 mg total) by mouth 3 (three) times daily. 01/06/23     hydroxychloroquine (PLAQUENIL) 200 MG tablet Take 1 tablet (200 mg total) by mouth 2 (two) times daily Monday-Friday. 12/30/22   Gearldine Bienenstock, PA-C  levothyroxine (SYNTHROID) 50 MCG tablet Take 1 tablet (50 mcg total) by mouth daily. 11/25/22   Sherlene Shams, MD  lisdexamfetamine (VYVANSE) 30 MG capsule Take 1 capsule (30 mg total) by mouth 2 (two) times daily. 01/16/23     mometasone (ELOCON) 0.1 % cream Apply 1 Application topically as directed. Up to 2 weeks as needed for ezema flares. Avoid face, groin, axilla. 09/19/22   [place], [place], MD  Multiple Vitamin (MULTIVITAMIN PO) Take by mouth.    [provider]  traMADol (ULTRAM) 50 MG tablet Take 1 tablet (50 mg total) by mouth every 12 (twelve) hours as needed. 10/16/22 04/14/23  Sherlene Shams, MD  tretinoin (RETIN-A) 0.025 % cream Apply topically at bedtime to face for acne as tolerated 09/19/22 09/19/23  [place], [place], MD  UNABLE TO FIND Hydroquinone 12% / Kojic Acid 6% / Niacinamide 2% / Vitamin C 1% Cream    [provider]  valACYclovir (VALTREX) 1000 MG tablet TAKE 2 TABLETS BY MOUTH AT ONSET OF SYMPTOMS OF COLD  SORES THEN 2 TABLETS 12 HOURS LATER AS DIRECTED. Patient not taking: Reported on 01/14/2023 12/12/22 12/12/23  Deirdre Evener, MD  zolpidem (AMBIEN CR) 12.5 MG CR tablet Take 1 tablet (12.5 mg total) by mouth at bedtime as needed for sleep. 11/14/22         Allergies    Cephalexin, Doxycycline, Tape, Trazodone, Zyrtec [cetirizine], and Lidocaine    Review of Systems   Review of Systems  Respiratory:  Positive for shortness of breath.     Physical Exam Updated Vital Signs BP (!) 126/90   Pulse 97   Temp 98.6 F (37 C) (Oral)   Resp 20   Ht 5\' 7"  (1.702 m)   Wt 102.1 kg   LMP 01/01/2023 (Exact Date)   SpO2 100%   BMI 35.24 kg/m  Physical Exam Vitals and  nursing note reviewed.  Constitutional:      General: She is not in acute distress.    Appearance: She is well-developed.  HENT:     Head: Normocephalic and atraumatic.  Eyes:     Pupils: Pupils are equal, round, and reactive to light.  Cardiovascular:     Rate and Rhythm: Regular rhythm. Tachycardia present.     Heart sounds: Normal heart sounds. No murmur heard.    No friction rub.  Pulmonary:     Effort: Pulmonary effort is normal. No tachypnea.     Breath sounds: Normal breath sounds. No wheezing or rales.  Abdominal:     General: Bowel sounds are normal. There is no distension.     Palpations: Abdomen is soft.     Tenderness: There is no abdominal tenderness. There is no guarding or rebound.  Musculoskeletal:        General: No tenderness. Normal range of motion.     Right lower leg: No edema.     Left lower leg: No edema.     Comments: No edema  Skin:    General: Skin is warm and dry.     Findings: No rash.  Neurological:     Mental Status: She is alert and oriented to person, place, and time.     Cranial Nerves: No cranial nerve deficit.  Psychiatric:        Behavior: Behavior normal.     ED Results / Procedures / Treatments   Labs (all labs ordered are listed, but only abnormal results are displayed) Labs Reviewed  CBC - Abnormal; Notable for the following components:      Result Value   WBC 13.4 (*)    RBC 5.39 (*)    Hemoglobin 16.1 (*)    HCT 47.4 (*)    All other components within normal limits  BASIC METABOLIC PANEL  PREGNANCY, URINE  TROPONIN I (HIGH SENSITIVITY)    EKG EKG Interpretation Date/Time:  Friday January 17 2023 16:16:39 EDT Ventricular Rate:  114 PR Interval:  144 QRS Duration:  88 QT Interval:  334 QTC Calculation: 460 R Axis:   77  Text Interpretation: Sinus tachycardia Otherwise normal ECG When compared with ECG of 24-Apr-2019 08:41, PREVIOUS ECG IS PRESENT No significant change since last tracing Confirmed by Gwyneth Sprout  [phone]) on 01/17/2023 7:30:28 PM  Radiology CT Angio Chest PE W and/or Wo Contrast  Result Date: 01/17/2023 CLINICAL DATA:  Shortness of breath with left shoulder blade pain EXAM: CT ANGIOGRAPHY CHEST WITH CONTRAST TECHNIQUE: Multidetector CT imaging of the chest was performed using the standard protocol during bolus administration of intravenous contrast. Multiplanar CT  image reconstructions and MIPs were obtained to evaluate the vascular anatomy. RADIATION DOSE REDUCTION: This exam was performed according to the departmental dose-optimization program which includes automated exposure control, adjustment of the mA and/or kV according to patient size and/or use of iterative reconstruction technique. CONTRAST:  75mL OMNIPAQUE IOHEXOL 350 MG/ML SOLN COMPARISON:  Chest x-ray 01/17/2023 FINDINGS: Cardiovascular: Satisfactory opacification of the pulmonary arteries to the segmental level. No evidence of pulmonary embolism. Normal heart size. No pericardial effusion. Nonaneurysmal aorta. No dissection is seen. Mediastinum/Nodes: No enlarged mediastinal, hilar, or axillary lymph nodes. Thyroid gland, trachea, and esophagus demonstrate no significant findings. Lungs/Pleura: Lungs are clear. No pleural effusion or pneumothorax. Upper Abdomen: No acute abnormality. Musculoskeletal: No chest wall abnormality. No acute or significant osseous findings. Review of the MIP images confirms the above findings. IMPRESSION: 1. Negative for acute pulmonary embolus or aortic dissection. 2. Clear lungs. Electronically Signed   By: Jasmine Pang M.D.   On: 01/17/2023 22:12   DG Chest 2 View  Result Date: 01/17/2023 CLINICAL DATA:  SHORTNESS OF BREATH EXAM: CHEST - 2 VIEW COMPARISON:  CHEST X-RAY 04/06/2019 FINDINGS: Small nodular density projects over the anterior right first rib end measuring 8 mm, not definitely seen on prior. The lungs are otherwise clear. No pleural effusion or pneumothorax. Cardiomediastinal silhouette is  within normal limits. No acute fractures. IMPRESSION: 1. No acute cardiopulmonary process. 2. Small nodular density projects over the anterior right first rib end, not definitely seen prior. This could represent a pulmonary nodule versus a bone island. Recommend follow-up nonemergent chest CT. Electronically Signed   By: Darliss Cheney M.D.   On: 01/17/2023 17:44    Procedures Procedures    Medications Ordered in ED Medications  iohexol (OMNIPAQUE) 350 MG/ML injection 100 mL (75 mLs Intravenous Contrast Given 01/17/23 2153)    ED Course/ Medical Decision Making/ A&P                                 Medical Decision Making Amount and/or Complexity of Data Reviewed External Data Reviewed: notes. Labs: ordered. Decision-making details documented in ED Course. Radiology: ordered and independent interpretation performed. Decision-making details documented in ED Course. ECG/medicine tests: ordered and independent interpretation performed. Decision-making details documented in ED Course.  Risk Prescription drug management.   Pt with multiple medical problems and comorbidities and presenting today with a complaint that caries a high risk for morbidity and mortality.  Patient here with soreness in her upper back, new shortness of breath and persistent tachycardia.  Patient is a tobacco user and is moderate risk for PE versus dysrhythmia versus pericarditis.  She denies any infectious symptoms and lower suspicion for pneumonia, pneumothorax, no wheezing on exam suggestive of bronchitis or COPD.  Patient does have Hashimoto's thyroiditis but recently had TSH checked and it was normal.  Lower suspicion for cardiac etiology as patient has no chest pain and no known heart disease.  I independently interpreted patient's EKG and labs.  EKG shows a sinus tachycardia but no evidence of dysrhythmia, ST or T wave changes concerning for pericarditis.  CBC with leukocytosis of 13 which is improved from 16 a few  days ago, troponin negative, urine pregnancy test negative, BMP within normal limits Patient will get a CTA for further evaluation.  10:44 PM I have independently visualized and interpreted pt's images today.  CTA today appears normal without evidence of PE.  Radiology reports no evidence of  PE or aortic dissection.  Findings discussed with the patient.  O2 sat remains at 100% on room air.  At this time feels that she is stable for discharge.         Final Clinical Impression(s) / ED Diagnoses Final diagnoses:  Shortness of breath  Acute right-sided thoracic back pain    Rx / DC Orders ED Discharge Orders     None         Gwyneth Sprout, MD 01/17/23 2244

## 2023-01-17 NOTE — ED Notes (Signed)
Patient transported to CT 

## 2023-01-20 ENCOUNTER — Other Ambulatory Visit: Payer: Self-pay

## 2023-01-20 ENCOUNTER — Other Ambulatory Visit (HOSPITAL_COMMUNITY): Payer: Self-pay

## 2023-02-03 ENCOUNTER — Other Ambulatory Visit (HOSPITAL_COMMUNITY): Payer: Self-pay

## 2023-02-04 ENCOUNTER — Other Ambulatory Visit: Payer: Self-pay

## 2023-02-04 ENCOUNTER — Other Ambulatory Visit (HOSPITAL_COMMUNITY): Payer: Self-pay

## 2023-02-04 MED ORDER — GABAPENTIN 600 MG PO TABS
600.0000 mg | ORAL_TABLET | Freq: Three times a day (TID) | ORAL | 0 refills | Status: DC
  Filled 2023-02-04: qty 60, 20d supply, fill #0

## 2023-02-05 ENCOUNTER — Inpatient Hospital Stay: Payer: Commercial Managed Care - PPO | Admitting: Oncology

## 2023-02-05 DIAGNOSIS — F4000 Agoraphobia, unspecified: Secondary | ICD-10-CM | POA: Diagnosis not present

## 2023-02-05 DIAGNOSIS — F919 Conduct disorder, unspecified: Secondary | ICD-10-CM | POA: Diagnosis not present

## 2023-02-05 DIAGNOSIS — F329 Major depressive disorder, single episode, unspecified: Secondary | ICD-10-CM | POA: Diagnosis not present

## 2023-02-08 ENCOUNTER — Other Ambulatory Visit (HOSPITAL_COMMUNITY): Payer: Self-pay

## 2023-02-10 ENCOUNTER — Other Ambulatory Visit: Payer: Self-pay

## 2023-02-11 ENCOUNTER — Other Ambulatory Visit (HOSPITAL_COMMUNITY): Payer: Self-pay

## 2023-02-16 ENCOUNTER — Other Ambulatory Visit (HOSPITAL_COMMUNITY): Payer: Self-pay

## 2023-02-18 ENCOUNTER — Other Ambulatory Visit (HOSPITAL_COMMUNITY): Payer: Self-pay

## 2023-02-18 MED ORDER — GABAPENTIN 600 MG PO TABS
600.0000 mg | ORAL_TABLET | Freq: Three times a day (TID) | ORAL | 0 refills | Status: DC
  Filled 2023-02-18 – 2023-02-20 (×3): qty 60, 20d supply, fill #0

## 2023-02-19 ENCOUNTER — Other Ambulatory Visit (HOSPITAL_COMMUNITY): Payer: Self-pay

## 2023-02-19 ENCOUNTER — Other Ambulatory Visit: Payer: Self-pay

## 2023-02-19 MED ORDER — ZOLPIDEM TARTRATE ER 12.5 MG PO TBCR
12.5000 mg | EXTENDED_RELEASE_TABLET | Freq: Every day | ORAL | 3 refills | Status: DC
  Filled 2023-02-19 (×3): qty 30, 30d supply, fill #0
  Filled 2023-03-23: qty 30, 30d supply, fill #1
  Filled 2023-04-24: qty 30, 30d supply, fill #2
  Filled 2023-05-21 – 2023-05-22 (×2): qty 30, 30d supply, fill #3

## 2023-02-20 ENCOUNTER — Encounter: Payer: Self-pay | Admitting: Oncology

## 2023-02-20 ENCOUNTER — Other Ambulatory Visit: Payer: Self-pay

## 2023-02-20 ENCOUNTER — Inpatient Hospital Stay: Payer: Commercial Managed Care - PPO | Attending: Oncology | Admitting: Oncology

## 2023-02-20 ENCOUNTER — Other Ambulatory Visit (HOSPITAL_BASED_OUTPATIENT_CLINIC_OR_DEPARTMENT_OTHER): Payer: Self-pay

## 2023-02-20 ENCOUNTER — Other Ambulatory Visit (HOSPITAL_COMMUNITY): Payer: Self-pay

## 2023-02-20 VITALS — BP 136/91 | HR 3 | Temp 98.3°F | Resp 18 | Wt 226.7 lb

## 2023-02-20 DIAGNOSIS — D75839 Thrombocytosis, unspecified: Secondary | ICD-10-CM | POA: Diagnosis not present

## 2023-02-20 DIAGNOSIS — Z808 Family history of malignant neoplasm of other organs or systems: Secondary | ICD-10-CM | POA: Diagnosis not present

## 2023-02-20 DIAGNOSIS — D751 Secondary polycythemia: Secondary | ICD-10-CM | POA: Insufficient documentation

## 2023-02-20 DIAGNOSIS — D72829 Elevated white blood cell count, unspecified: Secondary | ICD-10-CM | POA: Diagnosis not present

## 2023-02-20 DIAGNOSIS — Z720 Tobacco use: Secondary | ICD-10-CM | POA: Diagnosis not present

## 2023-02-20 DIAGNOSIS — F17210 Nicotine dependence, cigarettes, uncomplicated: Secondary | ICD-10-CM | POA: Insufficient documentation

## 2023-02-20 DIAGNOSIS — Z803 Family history of malignant neoplasm of breast: Secondary | ICD-10-CM | POA: Insufficient documentation

## 2023-02-20 MED ORDER — LISDEXAMFETAMINE DIMESYLATE 30 MG PO CAPS
30.0000 mg | ORAL_CAPSULE | Freq: Two times a day (BID) | ORAL | 0 refills | Status: DC
  Filled 2023-02-20: qty 60, 30d supply, fill #0

## 2023-02-20 NOTE — Assessment & Plan Note (Signed)
Likely reactive, secondary to smoking.

## 2023-02-20 NOTE — Assessment & Plan Note (Signed)
Labs reviewed and discussed with patient that Leukocytosis, differential diagnosis is broad, acute or chronic infection and inflammation, smoking, autoimmune disease, or underlying bone marrow disorders.   Leukocytosis likely reactive, secondary to smoking.   Previous work up showed  normal, LDH, negative peripheral flowcytometry,negative hepatitis, negative HIV, no M protein on SPEP Observation.

## 2023-02-20 NOTE — Assessment & Plan Note (Signed)
Recommend smoke cessation.
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
## 2023-02-20 NOTE — Assessment & Plan Note (Signed)
Likely reactive, secondary to smoking.  JAK2 V617F mutation negative, with reflex to other mutations CALR, MPL, JAK 2 Ex 12-15 mutations negative. Negative BCR-ABL1 FISH

## 2023-02-27 ENCOUNTER — Ambulatory Visit: Payer: Commercial Managed Care - PPO | Admitting: Nurse Practitioner

## 2023-03-03 ENCOUNTER — Other Ambulatory Visit (HOSPITAL_COMMUNITY): Payer: Self-pay

## 2023-03-06 ENCOUNTER — Ambulatory Visit: Payer: Commercial Managed Care - PPO | Admitting: Nurse Practitioner

## 2023-03-10 ENCOUNTER — Other Ambulatory Visit: Payer: Self-pay | Admitting: Physician Assistant

## 2023-03-10 ENCOUNTER — Other Ambulatory Visit: Payer: Self-pay

## 2023-03-10 ENCOUNTER — Other Ambulatory Visit (HOSPITAL_COMMUNITY): Payer: Self-pay

## 2023-03-10 MED ORDER — GABAPENTIN 600 MG PO TABS
600.0000 mg | ORAL_TABLET | Freq: Three times a day (TID) | ORAL | 0 refills | Status: DC
  Filled 2023-03-10: qty 60, 20d supply, fill #0

## 2023-03-10 MED ORDER — HYDROXYCHLOROQUINE SULFATE 200 MG PO TABS
200.0000 mg | ORAL_TABLET | Freq: Two times a day (BID) | ORAL | 2 refills | Status: DC
  Filled 2023-03-10: qty 40, 28d supply, fill #0
  Filled 2023-04-10: qty 40, 28d supply, fill #1
  Filled 2023-05-06: qty 40, 28d supply, fill #2

## 2023-03-10 NOTE — Telephone Encounter (Signed)
Last Fill: 12/30/2022  Eye exam: 01/01/2023 WNL    Labs: 01/17/2023 WBC 13.4, RBC 5.39, Hgb 16.1, Hct 47.4  Next Visit: 06/10/2023  Last Visit: 01/07/2023  [id number] arthritis   Current Dose per office note 01/07/2023: Plaquenil 200 mg 1 tablet by mouth twice daily Monday through Friday.   Okay to refill Plaquenil?

## 2023-03-13 ENCOUNTER — Other Ambulatory Visit (HOSPITAL_COMMUNITY): Payer: Self-pay

## 2023-03-13 ENCOUNTER — Other Ambulatory Visit: Payer: Self-pay

## 2023-03-23 ENCOUNTER — Other Ambulatory Visit (HOSPITAL_COMMUNITY): Payer: Self-pay

## 2023-04-03 ENCOUNTER — Other Ambulatory Visit (HOSPITAL_COMMUNITY): Payer: Self-pay

## 2023-04-03 DIAGNOSIS — M25512 Pain in left shoulder: Secondary | ICD-10-CM | POA: Diagnosis not present

## 2023-04-03 DIAGNOSIS — M19012 Primary osteoarthritis, left shoulder: Secondary | ICD-10-CM | POA: Diagnosis not present

## 2023-04-03 MED ORDER — LISDEXAMFETAMINE DIMESYLATE 30 MG PO CAPS
30.0000 mg | ORAL_CAPSULE | Freq: Two times a day (BID) | ORAL | 0 refills | Status: DC
  Filled 2023-04-03: qty 60, 30d supply, fill #0

## 2023-04-10 ENCOUNTER — Other Ambulatory Visit (HOSPITAL_COMMUNITY): Payer: Self-pay

## 2023-04-10 ENCOUNTER — Other Ambulatory Visit: Payer: Self-pay

## 2023-04-11 ENCOUNTER — Other Ambulatory Visit: Payer: Self-pay

## 2023-04-11 ENCOUNTER — Other Ambulatory Visit (HOSPITAL_COMMUNITY): Payer: Self-pay

## 2023-04-11 MED ORDER — GABAPENTIN 600 MG PO TABS
600.0000 mg | ORAL_TABLET | Freq: Three times a day (TID) | ORAL | 0 refills | Status: DC
  Filled 2023-04-11: qty 60, 20d supply, fill #0

## 2023-04-11 MED ORDER — ALPRAZOLAM 0.5 MG PO TABS
0.5000 mg | ORAL_TABLET | Freq: Every day | ORAL | 3 refills | Status: DC | PRN
  Filled 2023-04-11: qty 30, 30d supply, fill #0
  Filled 2023-05-12: qty 30, 30d supply, fill #1
  Filled 2023-06-16: qty 30, 30d supply, fill #2
  Filled 2023-07-16: qty 30, 30d supply, fill #3

## 2023-04-19 ENCOUNTER — Other Ambulatory Visit: Payer: Self-pay | Admitting: Internal Medicine

## 2023-04-21 ENCOUNTER — Other Ambulatory Visit (HOSPITAL_COMMUNITY): Payer: Self-pay

## 2023-04-21 MED ORDER — LEVOTHYROXINE SODIUM 50 MCG PO TABS
50.0000 ug | ORAL_TABLET | Freq: Every day | ORAL | 0 refills | Status: DC
  Filled 2023-04-21: qty 90, 90d supply, fill #0

## 2023-04-24 ENCOUNTER — Other Ambulatory Visit (HOSPITAL_COMMUNITY): Payer: Self-pay

## 2023-04-29 ENCOUNTER — Encounter: Payer: Self-pay | Admitting: Internal Medicine

## 2023-04-29 ENCOUNTER — Other Ambulatory Visit (HOSPITAL_COMMUNITY): Payer: Self-pay

## 2023-04-29 ENCOUNTER — Other Ambulatory Visit: Payer: Self-pay | Admitting: Internal Medicine

## 2023-04-29 MED ORDER — TRAMADOL HCL 50 MG PO TABS
50.0000 mg | ORAL_TABLET | Freq: Two times a day (BID) | ORAL | 0 refills | Status: DC | PRN
  Filled 2023-04-29 – 2023-06-09 (×2): qty 60, 30d supply, fill #0

## 2023-05-02 ENCOUNTER — Other Ambulatory Visit: Payer: Self-pay

## 2023-05-02 ENCOUNTER — Other Ambulatory Visit (HOSPITAL_COMMUNITY): Payer: Self-pay

## 2023-05-05 ENCOUNTER — Other Ambulatory Visit (HOSPITAL_COMMUNITY): Payer: Self-pay

## 2023-05-05 MED ORDER — LISDEXAMFETAMINE DIMESYLATE 30 MG PO CAPS
30.0000 mg | ORAL_CAPSULE | Freq: Two times a day (BID) | ORAL | 0 refills | Status: DC
Start: 2023-04-02 — End: 2023-06-12
  Filled 2023-05-05 – 2023-05-07 (×2): qty 60, 30d supply, fill #0

## 2023-05-06 ENCOUNTER — Other Ambulatory Visit (HOSPITAL_COMMUNITY): Payer: Self-pay

## 2023-05-06 ENCOUNTER — Other Ambulatory Visit: Payer: Self-pay

## 2023-05-06 MED ORDER — GABAPENTIN 600 MG PO TABS
600.0000 mg | ORAL_TABLET | Freq: Three times a day (TID) | ORAL | 0 refills | Status: DC
  Filled 2023-05-06: qty 60, 20d supply, fill #0

## 2023-05-07 ENCOUNTER — Other Ambulatory Visit (HOSPITAL_COMMUNITY): Payer: Self-pay

## 2023-05-07 ENCOUNTER — Other Ambulatory Visit: Payer: Self-pay

## 2023-05-07 DIAGNOSIS — F4000 Agoraphobia, unspecified: Secondary | ICD-10-CM | POA: Diagnosis not present

## 2023-05-07 DIAGNOSIS — F919 Conduct disorder, unspecified: Secondary | ICD-10-CM | POA: Diagnosis not present

## 2023-05-07 DIAGNOSIS — F329 Major depressive disorder, single episode, unspecified: Secondary | ICD-10-CM | POA: Diagnosis not present

## 2023-05-08 ENCOUNTER — Other Ambulatory Visit: Payer: Self-pay

## 2023-05-08 ENCOUNTER — Other Ambulatory Visit (HOSPITAL_COMMUNITY): Payer: Self-pay

## 2023-05-08 MED ORDER — DOXEPIN HCL 50 MG PO CAPS
50.0000 mg | ORAL_CAPSULE | Freq: Every day | ORAL | 2 refills | Status: DC
  Filled 2023-05-08: qty 60, 30d supply, fill #0
  Filled 2023-06-09: qty 60, 30d supply, fill #1
  Filled 2023-07-04: qty 60, 30d supply, fill #2

## 2023-05-12 ENCOUNTER — Other Ambulatory Visit (HOSPITAL_COMMUNITY): Payer: Self-pay

## 2023-05-13 ENCOUNTER — Other Ambulatory Visit (HOSPITAL_COMMUNITY): Payer: Self-pay

## 2023-05-14 ENCOUNTER — Other Ambulatory Visit (HOSPITAL_COMMUNITY): Payer: Self-pay

## 2023-05-14 ENCOUNTER — Other Ambulatory Visit: Payer: Self-pay

## 2023-05-21 ENCOUNTER — Other Ambulatory Visit (HOSPITAL_COMMUNITY): Payer: Self-pay

## 2023-05-21 ENCOUNTER — Other Ambulatory Visit: Payer: Self-pay

## 2023-05-22 ENCOUNTER — Other Ambulatory Visit (HOSPITAL_COMMUNITY): Payer: Self-pay

## 2023-05-22 MED ORDER — GABAPENTIN 600 MG PO TABS: ORAL_TABLET | ORAL | 0 refills | Status: DC

## 2023-05-27 DIAGNOSIS — R39198 Other difficulties with micturition: Secondary | ICD-10-CM | POA: Diagnosis not present

## 2023-05-27 DIAGNOSIS — R200 Anesthesia of skin: Secondary | ICD-10-CM | POA: Diagnosis not present
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
## 2023-05-30 ENCOUNTER — Other Ambulatory Visit: Payer: Self-pay | Admitting: Neurology

## 2023-05-30 DIAGNOSIS — R200 Anesthesia of skin: Secondary | ICD-10-CM

## 2023-05-30 DIAGNOSIS — R39198 Other difficulties with micturition: Secondary | ICD-10-CM

## 2023-06-09 ENCOUNTER — Other Ambulatory Visit: Payer: Self-pay

## 2023-06-09 ENCOUNTER — Other Ambulatory Visit (HOSPITAL_COMMUNITY): Payer: Self-pay

## 2023-06-10 ENCOUNTER — Encounter: Payer: Self-pay | Admitting: Physician Assistant

## 2023-06-10 ENCOUNTER — Ambulatory Visit: Payer: Commercial Managed Care - PPO | Attending: Physician Assistant | Admitting: Physician Assistant

## 2023-06-10 VITALS — BP 128/84 | HR 108 | Resp 14 | Ht 67.0 in | Wt 218.0 lb

## 2023-06-10 DIAGNOSIS — Z131 Encounter for screening for diabetes mellitus: Secondary | ICD-10-CM | POA: Diagnosis not present

## 2023-06-10 DIAGNOSIS — R002 Palpitations: Secondary | ICD-10-CM

## 2023-06-10 DIAGNOSIS — M1990 Unspecified osteoarthritis, unspecified site: Secondary | ICD-10-CM

## 2023-06-10 DIAGNOSIS — R590 Localized enlarged lymph nodes: Secondary | ICD-10-CM

## 2023-06-10 DIAGNOSIS — E559 Vitamin D deficiency, unspecified: Secondary | ICD-10-CM

## 2023-06-10 DIAGNOSIS — M7918 Myalgia, other site: Secondary | ICD-10-CM | POA: Diagnosis not present

## 2023-06-10 DIAGNOSIS — S83511A Sprain of anterior cruciate ligament of right knee, initial encounter: Secondary | ICD-10-CM

## 2023-06-10 DIAGNOSIS — Z79899 Other long term (current) drug therapy: Secondary | ICD-10-CM

## 2023-06-10 DIAGNOSIS — R5383 Other fatigue: Secondary | ICD-10-CM

## 2023-06-10 DIAGNOSIS — F419 Anxiety disorder, unspecified: Secondary | ICD-10-CM

## 2023-06-10 DIAGNOSIS — Z803 Family history of malignant neoplasm of breast: Secondary | ICD-10-CM

## 2023-06-10 DIAGNOSIS — R768 Other specified abnormal immunological findings in serum: Secondary | ICD-10-CM

## 2023-06-10 DIAGNOSIS — M545 Low back pain, unspecified: Secondary | ICD-10-CM | POA: Diagnosis not present

## 2023-06-10 DIAGNOSIS — M1711 Unilateral primary osteoarthritis, right knee: Secondary | ICD-10-CM | POA: Diagnosis not present

## 2023-06-10 DIAGNOSIS — E781 Pure hyperglyceridemia: Secondary | ICD-10-CM | POA: Diagnosis not present

## 2023-06-10 DIAGNOSIS — E063 Autoimmune thyroiditis: Secondary | ICD-10-CM

## 2023-06-10 DIAGNOSIS — F5105 Insomnia due to other mental disorder: Secondary | ICD-10-CM

## 2023-06-10 DIAGNOSIS — K5909 Other constipation: Secondary | ICD-10-CM

## 2023-06-10 DIAGNOSIS — F17200 Nicotine dependence, unspecified, uncomplicated: Secondary | ICD-10-CM

## 2023-06-11 ENCOUNTER — Telehealth: Payer: Self-pay

## 2023-06-11 ENCOUNTER — Encounter: Payer: Self-pay | Admitting: Internal Medicine

## 2023-06-11 ENCOUNTER — Other Ambulatory Visit (HOSPITAL_COMMUNITY): Payer: Self-pay

## 2023-06-11 ENCOUNTER — Telehealth: Payer: Commercial Managed Care - PPO | Admitting: Internal Medicine

## 2023-06-11 VITALS — Ht 67.0 in | Wt 218.0 lb

## 2023-06-11 DIAGNOSIS — D72829 Elevated white blood cell count, unspecified: Secondary | ICD-10-CM | POA: Diagnosis not present

## 2023-06-11 DIAGNOSIS — G8929 Other chronic pain: Secondary | ICD-10-CM | POA: Diagnosis not present

## 2023-06-11 DIAGNOSIS — E039 Hypothyroidism, unspecified: Secondary | ICD-10-CM | POA: Insufficient documentation

## 2023-06-11 DIAGNOSIS — D751 Secondary polycythemia: Secondary | ICD-10-CM

## 2023-06-11 DIAGNOSIS — D75839 Thrombocytosis, unspecified: Secondary | ICD-10-CM

## 2023-06-11 DIAGNOSIS — M064 Inflammatory polyarthropathy: Secondary | ICD-10-CM

## 2023-06-11 LAB — THYROID PANEL WITH TSH
Free Thyroxine Index: 2.2 (ref 1.4–3.8)
T3 Uptake: 29 % (ref 22–35)
T4, Total: 7.6 ug/dL (ref 5.1–11.9)
TSH: 3.39 m[IU]/L

## 2023-06-11 LAB — LIPID PANEL
Cholesterol: 223 mg/dL — ABNORMAL HIGH (ref <200)
HDL: 55 mg/dL (ref >=50)
LDL Cholesterol (Calc): 133 mg/dL — ABNORMAL HIGH
Non-HDL Cholesterol (Calc): 168 mg/dL — ABNORMAL HIGH (ref <130)
Total CHOL/HDL Ratio: 4.1 (calc) (ref <5.0)
Triglycerides: 213 mg/dL — ABNORMAL HIGH (ref <150)

## 2023-06-11 LAB — COMPLETE METABOLIC PANEL WITH GFR
AG Ratio: 1.7 (calc) (ref 1.0–2.5)
ALT: 18 U/L (ref 6–29)
AST: 15 U/L (ref 10–30)
Albumin: 4.4 g/dL (ref 3.6–5.1)
Alkaline phosphatase (APISO): 58 U/L (ref 31–125)
BUN: 9 mg/dL (ref 7–25)
CO2: 27 mmol/L (ref 20–32)
Calcium: 9.5 mg/dL (ref 8.6–10.2)
Chloride: 105 mmol/L (ref 98–110)
Creat: 0.63 mg/dL (ref 0.50–0.97)
Globulin: 2.6 g/dL (ref 1.9–3.7)
Glucose, Bld: 81 mg/dL (ref 65–99)
Potassium: 4.3 mmol/L (ref 3.5–5.3)
Sodium: 138 mmol/L (ref 135–146)
Total Bilirubin: 0.4 mg/dL (ref 0.2–1.2)
Total Protein: 7 g/dL (ref 6.1–8.1)
eGFR: 116 mL/min/{1.73_m2} (ref >=60)

## 2023-06-11 LAB — CBC WITH DIFFERENTIAL/PLATELET
Absolute Lymphocytes: 3775 {cells}/uL (ref 850–3900)
Absolute Monocytes: 620 {cells}/uL (ref 200–950)
Basophils Absolute: 66 {cells}/uL (ref 0–200)
Basophils Relative: 0.5 %
Eosinophils Absolute: 317 {cells}/uL (ref 15–500)
Eosinophils Relative: 2.4 %
HCT: 43.4 % (ref 35.0–45.0)
Hemoglobin: 14.8 g/dL (ref 11.7–15.5)
MCH: 30.1 pg (ref 27.0–33.0)
MCHC: 34.1 g/dL (ref 32.0–36.0)
MCV: 88.2 fL (ref 80.0–100.0)
MPV: 10.3 fL (ref 7.5–12.5)
Monocytes Relative: 4.7 %
Neutro Abs: 8422 {cells}/uL — ABNORMAL HIGH (ref 1500–7800)
Neutrophils Relative %: 63.8 %
Platelets: 399 10*3/uL (ref 140–400)
RBC: 4.92 10*6/uL (ref 3.80–5.10)
RDW: 12.8 % (ref 11.0–15.0)
Total Lymphocyte: 28.6 %
WBC: 13.2 10*3/uL — ABNORMAL HIGH (ref 3.8–10.8)

## 2023-06-11 LAB — SEDIMENTATION RATE: Sed Rate: 6 mm/h (ref 0–20)

## 2023-06-11 LAB — HEMOGLOBIN A1C
Hgb A1c MFr Bld: 5.3 %{Hb} (ref <5.7)
Mean Plasma Glucose: 105 mg/dL
eAG (mmol/L): 5.8 mmol/L

## 2023-06-11 LAB — VITAMIN D 25 HYDROXY (VIT D DEFICIENCY, FRACTURES): Vit D, 25-Hydroxy: 29 ng/mL — ABNORMAL LOW (ref 30–100)

## 2023-06-11 LAB — C-REACTIVE PROTEIN: CRP: 7 mg/L (ref <8.0)

## 2023-06-11 MED ORDER — TRAMADOL HCL 50 MG PO TABS
50.0000 mg | ORAL_TABLET | Freq: Two times a day (BID) | ORAL | 5 refills | Status: AC | PRN
  Filled 2023-06-11 – 2023-07-11 (×2): qty 60, 30d supply, fill #0

## 2023-06-11 MED ORDER — LEVOTHYROXINE SODIUM 50 MCG PO TABS
50.0000 ug | ORAL_TABLET | Freq: Every day | ORAL | 0 refills | Status: DC
  Filled 2023-06-11: qty 90, 90d supply, fill #0

## 2023-06-11 MED ORDER — LEVOTHYROXINE SODIUM 75 MCG PO TABS
75.0000 ug | ORAL_TABLET | Freq: Every day | ORAL | 0 refills | Status: DC
  Filled 2023-06-11: qty 90, 90d supply, fill #0

## 2023-06-11 MED ORDER — FAMOTIDINE 20 MG PO TABS
20.0000 mg | ORAL_TABLET | Freq: Two times a day (BID) | ORAL | 1 refills | Status: DC
  Filled 2023-06-11: qty 180, 90d supply, fill #0
  Filled 2023-09-29: qty 180, 90d supply, fill #1

## 2023-06-11 MED ORDER — PROMETHAZINE HCL 25 MG PO TABS
25.0000 mg | ORAL_TABLET | Freq: Three times a day (TID) | ORAL | 0 refills | Status: DC | PRN
  Filled 2023-06-11: qty 20, 7d supply, fill #0

## 2023-06-11 MED ORDER — ONDANSETRON HCL 4 MG PO TABS
4.0000 mg | ORAL_TABLET | Freq: Three times a day (TID) | ORAL | 0 refills | Status: DC | PRN
  Filled 2023-06-11: qty 20, 7d supply, fill #0

## 2023-06-11 MED ORDER — PROMETHAZINE HCL 25 MG PO TABS: 25.0000 mg | ORAL_TABLET | Freq: Three times a day (TID) | ORAL | 0 refills | Status: DC | PRN

## 2023-06-11 NOTE — Assessment & Plan Note (Signed)
 Currently her incresaed pain is not indicative or rheumatoid flare (esr.CRP normal).  Encouraged to start water aerobics or some other low impact aerobic exercise.

## 2023-06-11 NOTE — Progress Notes (Signed)
 WBC count remains elevated-trending down.  Absolute neutrophils are elevated--any recent bacterial infection?  Rest of CBC WNL.  CMP WNL Thyroid  panel WNL  Vitamin D  is borderline low-29-please clarify if she is currently taking a vitamin D  supplement?  ESR and CRP are WNL-reassuring that inflammation is under control.  If her hand pain persists or worsens we can schedule a repeat ultrasound of both hands to assess for active synovitis.   Lipid panel and HgbA1c updated as requested-results should be forwarded to PCP for upcoming yearly physical

## 2023-06-11 NOTE — Telephone Encounter (Signed)
 Appt has been switched to virtual due to pt having diarrhea.

## 2023-06-11 NOTE — Assessment & Plan Note (Signed)
 Chronic,  but Per hematology reactive..  No evidence of of lymphoproliferative process

## 2023-06-11 NOTE — Telephone Encounter (Signed)
 Patient called [id number] and stated she woke up with diarrhea and wanted to know if you could switch her appointment today to a virtual visit?

## 2023-06-11 NOTE — Assessment & Plan Note (Signed)
  Resolved on repeat CBC   Lab Results  Component Value Date   WBC 13.2 (H) 06/10/2023   HGB 14.8 06/10/2023   HCT 43.4 06/10/2023   MCV 88.2 06/10/2023   PLT 399 06/10/2023

## 2023-06-11 NOTE — Telephone Encounter (Signed)
 error

## 2023-06-11 NOTE — Assessment & Plan Note (Signed)
 Resolved,  likely secondary to smoking.   Lab Results  Component Value Date   WBC 13.2 (H) 06/10/2023   HGB 14.8 06/10/2023   HCT 43.4 06/10/2023   MCV 88.2 06/10/2023   PLT 399 06/10/2023

## 2023-06-11 NOTE — Assessment & Plan Note (Signed)
 Increasing levothyroxine from 50 mcg to 75 mcg daily .  Recheck tsh in 6 weeks  Lab Results  Component Value Date   TSH 3.39 06/10/2023
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
## 2023-06-12 ENCOUNTER — Other Ambulatory Visit (HOSPITAL_COMMUNITY): Payer: Self-pay

## 2023-06-12 ENCOUNTER — Other Ambulatory Visit: Payer: Self-pay

## 2023-06-12 DIAGNOSIS — G8929 Other chronic pain: Secondary | ICD-10-CM | POA: Insufficient documentation

## 2023-06-12 MED ORDER — LISDEXAMFETAMINE DIMESYLATE 30 MG PO CAPS
30.0000 mg | ORAL_CAPSULE | Freq: Two times a day (BID) | ORAL | 0 refills | Status: DC
  Filled 2023-06-12: qty 60, 30d supply, fill #0

## 2023-06-12 NOTE — Assessment & Plan Note (Signed)
 She has pain in multiple joints and lower back s econdary to RA , OA ND djd.  SHE IS MANAGING PAIN WITH TRAMADOL TAKEN TWICE DAILY AND GABAPENTIN 600 MG TWICE DAILY.  Refill history confirmed via [place] Controlled Substance databas, accessed by me today.Marland Kitchen

## 2023-06-16 ENCOUNTER — Other Ambulatory Visit (HOSPITAL_COMMUNITY): Payer: Self-pay

## 2023-06-17 ENCOUNTER — Other Ambulatory Visit (HOSPITAL_COMMUNITY): Payer: Self-pay

## 2023-06-17 ENCOUNTER — Other Ambulatory Visit: Payer: Self-pay

## 2023-06-18 ENCOUNTER — Ambulatory Visit
Admission: RE | Admit: 2023-06-18 | Discharge: 2023-06-18 | Disposition: A | Discharge status: Home / Self Care | Payer: Commercial Managed Care - PPO | Source: Ambulatory Visit | Attending: Neurology | Admitting: Neurology

## 2023-06-18 DIAGNOSIS — R39198 Other difficulties with micturition: Secondary | ICD-10-CM

## 2023-06-18 DIAGNOSIS — M47816 Spondylosis without myelopathy or radiculopathy, lumbar region: Secondary | ICD-10-CM | POA: Diagnosis not present

## 2023-06-18 DIAGNOSIS — R200 Anesthesia of skin: Secondary | ICD-10-CM

## 2023-06-18 DIAGNOSIS — M5126 Other intervertebral disc displacement, lumbar region: Secondary | ICD-10-CM | POA: Diagnosis not present

## 2023-06-18 DIAGNOSIS — D354 Benign neoplasm of pineal gland: Secondary | ICD-10-CM | POA: Diagnosis not present

## 2023-06-18 MED ORDER — GADOPICLENOL 0.5 MMOL/ML IV SOLN
10.0000 mL | Freq: Once | INTRAVENOUS | Status: AC | PRN
  Administered 2023-06-18: 10 mL via INTRAVENOUS

## 2023-06-20 ENCOUNTER — Other Ambulatory Visit (HOSPITAL_COMMUNITY): Payer: Self-pay

## 2023-06-20 MED ORDER — ZOLPIDEM TARTRATE ER 12.5 MG PO TBCR
12.5000 mg | EXTENDED_RELEASE_TABLET | Freq: Every day | ORAL | 3 refills | Status: DC
  Filled 2023-06-20: qty 30, 30d supply, fill #0
  Filled 2023-07-21 (×2): qty 30, 30d supply, fill #1
  Filled 2023-09-29: qty 30, 30d supply, fill #2

## 2023-06-24 ENCOUNTER — Inpatient Hospital Stay: Admission: RE | Admit: 2023-06-24 | Payer: Commercial Managed Care - PPO | Source: Ambulatory Visit

## 2023-07-02 ENCOUNTER — Other Ambulatory Visit (HOSPITAL_COMMUNITY): Payer: Self-pay

## 2023-07-02 ENCOUNTER — Other Ambulatory Visit (HOSPITAL_BASED_OUTPATIENT_CLINIC_OR_DEPARTMENT_OTHER): Payer: Self-pay

## 2023-07-02 ENCOUNTER — Telehealth: Payer: Commercial Managed Care - PPO | Admitting: Physician Assistant

## 2023-07-02 DIAGNOSIS — J208 Acute bronchitis due to other specified organisms: Secondary | ICD-10-CM | POA: Diagnosis not present

## 2023-07-02 DIAGNOSIS — B9689 Other specified bacterial agents as the cause of diseases classified elsewhere: Secondary | ICD-10-CM | POA: Diagnosis not present

## 2023-07-02 MED ORDER — AZITHROMYCIN 250 MG PO TABS
ORAL_TABLET | ORAL | 0 refills | Status: AC
  Filled 2023-07-02: qty 6, 5d supply, fill #0

## 2023-07-02 MED ORDER — BENZONATATE 100 MG PO CAPS
100.0000 mg | ORAL_CAPSULE | Freq: Three times a day (TID) | ORAL | 0 refills | Status: DC | PRN
  Filled 2023-07-02: qty 30, 5d supply, fill #0

## 2023-07-02 NOTE — Progress Notes (Signed)
E-Visit for Cough   We are sorry that you are not feeling well.  Here is how we plan to help!  Based on your presentation I believe you most likely have A cough due to bacteria.  When patients have a fever and a productive cough with a change in color or increased sputum production, we are concerned about bacterial bronchitis.  If left untreated it can progress to pneumonia.  If your symptoms do not improve with your treatment plan it is important that you contact your provider.   I have prescribed Azithromyin 250 mg: two tablets now and then one tablet daily for 4 additonal days    In addition you may use A non-prescription cough medication called Mucinex DM: take 2 tablets every 12 hours. and A prescription cough medication called Tessalon Perles 100mg . You may take 1-2 capsules every 8 hours as needed for your cough.  From your responses in the eVisit questionnaire you describe inflammation in the upper respiratory tract which is causing a significant cough.  This is commonly called Bronchitis and has four common causes:   Allergies Viral Infections Acid Reflux Bacterial Infection Allergies, viruses and acid reflux are treated by controlling symptoms or eliminating the cause. An example might be a cough caused by taking certain blood pressure medications. You stop the cough by changing the medication. Another example might be a cough caused by acid reflux. Controlling the reflux helps control the cough.     HOME CARE Only take medications as instructed by your medical team. Complete the entire course of an antibiotic. Drink plenty of fluids and get plenty of rest. Avoid close contacts especially the very young and the elderly Cover your mouth if you cough or cough into your sleeve. Always remember to wash your hands A steam or ultrasonic humidifier can help congestion.   GET HELP RIGHT AWAY IF: You develop worsening fever. You become short of breath You cough up blood. Your symptoms  persist after you have completed your treatment plan MAKE SURE YOU  Understand these instructions. Will watch your condition. Will get help right away if you are not doing well or get worse.    Thank you for choosing an e-visit.  Your e-visit answers were reviewed by a board certified advanced clinical practitioner to complete your personal care plan. Depending upon the condition, your plan could have included both over the counter or prescription medications.  Please review your pharmacy choice. Make sure the pharmacy is open so you can pick up prescription now. If there is a problem, you may contact your provider through [employer] and have the prescription routed to another pharmacy.  Your safety is important to [place]. If you have drug allergies check your prescription carefully.   For the next 24 hours you can use MyChart to ask questions about today's visit, request a non-urgent call back, or ask for a work or school excuse. You will get an email in the next two days asking about your experience. I hope that your e-visit has been valuable and will speed your recovery.   I have spent 5 minutes in review of e-visit questionnaire, review and updating patient chart, medical decision making and response to patient.   Margaretann Loveless, PA-C

## 2023-07-04 ENCOUNTER — Other Ambulatory Visit (HOSPITAL_COMMUNITY): Payer: Self-pay

## 2023-07-11 ENCOUNTER — Other Ambulatory Visit: Payer: Self-pay

## 2023-07-11 ENCOUNTER — Other Ambulatory Visit (HOSPITAL_COMMUNITY): Payer: Self-pay

## 2023-07-14 ENCOUNTER — Other Ambulatory Visit (HOSPITAL_COMMUNITY): Payer: Self-pay

## 2023-07-16 ENCOUNTER — Other Ambulatory Visit (HOSPITAL_COMMUNITY): Payer: Self-pay

## 2023-07-16 MED ORDER — LISDEXAMFETAMINE DIMESYLATE 30 MG PO CAPS
30.0000 mg | ORAL_CAPSULE | Freq: Two times a day (BID) | ORAL | 0 refills | Status: DC
  Filled 2023-07-16: qty 60, 30d supply, fill #0

## 2023-07-17 ENCOUNTER — Other Ambulatory Visit (HOSPITAL_COMMUNITY): Payer: Self-pay

## 2023-07-21 ENCOUNTER — Other Ambulatory Visit (HOSPITAL_COMMUNITY): Payer: Self-pay

## 2023-07-21 ENCOUNTER — Other Ambulatory Visit: Payer: Self-pay

## 2023-07-23 ENCOUNTER — Inpatient Hospital Stay: Admission: RE | Admit: 2023-07-23 | Payer: Commercial Managed Care - PPO | Source: Ambulatory Visit

## 2023-07-31 ENCOUNTER — Other Ambulatory Visit: Payer: Commercial Managed Care - PPO

## 2023-08-07 DIAGNOSIS — E039 Hypothyroidism, unspecified: Secondary | ICD-10-CM | POA: Diagnosis not present

## 2023-08-19 ENCOUNTER — Other Ambulatory Visit: Payer: Commercial Managed Care - PPO

## 2023-08-21 DIAGNOSIS — F329 Major depressive disorder, single episode, unspecified: Secondary | ICD-10-CM | POA: Diagnosis not present

## 2023-08-21 DIAGNOSIS — Z3201 Encounter for pregnancy test, result positive: Secondary | ICD-10-CM | POA: Diagnosis not present

## 2023-08-21 DIAGNOSIS — F919 Conduct disorder, unspecified: Secondary | ICD-10-CM | POA: Diagnosis not present

## 2023-08-21 DIAGNOSIS — F4000 Agoraphobia, unspecified: Secondary | ICD-10-CM | POA: Diagnosis not present

## 2023-08-28 ENCOUNTER — Other Ambulatory Visit (HOSPITAL_COMMUNITY): Payer: Self-pay

## 2023-08-28 ENCOUNTER — Other Ambulatory Visit: Payer: Self-pay | Admitting: Physician Assistant

## 2023-08-28 ENCOUNTER — Other Ambulatory Visit: Payer: Self-pay

## 2023-08-28 MED ORDER — HYDROXYCHLOROQUINE SULFATE 200 MG PO TABS
200.0000 mg | ORAL_TABLET | Freq: Two times a day (BID) | ORAL | 2 refills | Status: DC
  Filled 2023-08-28: qty 40, 28d supply, fill #0
  Filled 2023-09-29: qty 40, 28d supply, fill #1
  Filled 2023-10-25: qty 40, 28d supply, fill #2

## 2023-08-28 NOTE — Telephone Encounter (Signed)
 Last Fill: 03/10/2023   Eye exam: 01/01/2023 WNL    Labs: 06/10/2023 WBC count remains elevated-trending down.  Absolute neutrophils are elevated. Rest of CBC WNL. CMP WNL  Next Visit: 12/17/2023  Last Visit: 06/10/2023  DX: Inflammatory arthritis   Current Dose per office note 06/10/2023: Plaquenil 200 mg 1 tablet by mouth twice daily Monday through Friday.   Okay to refill Plaquenil?

## 2023-09-02 ENCOUNTER — Other Ambulatory Visit (HOSPITAL_COMMUNITY): Payer: Self-pay

## 2023-09-02 DIAGNOSIS — N925 Other specified irregular menstruation: Secondary | ICD-10-CM | POA: Diagnosis not present

## 2023-09-02 DIAGNOSIS — Z113 Encounter for screening for infections with a predominantly sexual mode of transmission: Secondary | ICD-10-CM | POA: Diagnosis not present

## 2023-09-02 DIAGNOSIS — Z3480 Encounter for supervision of other normal pregnancy, unspecified trimester: Secondary | ICD-10-CM | POA: Diagnosis not present

## 2023-09-02 MED ORDER — DOXYLAMINE-PYRIDOXINE 10-10 MG PO TBEC
2.0000 | DELAYED_RELEASE_TABLET | Freq: Every day | ORAL | 3 refills | Status: DC
  Filled 2023-09-02: qty 60, 30d supply, fill #0

## 2023-09-02 MED ORDER — PROMETHAZINE HCL 25 MG PO TABS
25.0000 mg | ORAL_TABLET | Freq: Four times a day (QID) | ORAL | 1 refills | Status: DC
Start: 2023-09-02 — End: 2024-01-08
  Filled 2023-09-02: qty 30, 8d supply, fill #0
  Filled 2023-09-29: qty 30, 8d supply, fill #1

## 2023-09-03 ENCOUNTER — Other Ambulatory Visit (HOSPITAL_COMMUNITY): Payer: Self-pay

## 2023-09-03 ENCOUNTER — Other Ambulatory Visit: Payer: Self-pay

## 2023-09-04 ENCOUNTER — Other Ambulatory Visit: Payer: Self-pay

## 2023-09-04 ENCOUNTER — Other Ambulatory Visit (HOSPITAL_COMMUNITY): Payer: Self-pay

## 2023-09-04 ENCOUNTER — Encounter: Payer: Self-pay | Admitting: Pharmacist

## 2023-09-18 DIAGNOSIS — Z3480 Encounter for supervision of other normal pregnancy, unspecified trimester: Secondary | ICD-10-CM | POA: Diagnosis not present

## 2023-09-18 DIAGNOSIS — Z369 Encounter for antenatal screening, unspecified: Secondary | ICD-10-CM | POA: Diagnosis not present

## 2023-09-18 LAB — OB RESULTS CONSOLE HIV ANTIBODY (ROUTINE TESTING): HIV: NONREACTIVE

## 2023-09-18 LAB — OB RESULTS CONSOLE RPR: RPR: NONREACTIVE

## 2023-09-18 LAB — OB RESULTS CONSOLE RUBELLA ANTIBODY, IGM: Rubella: IMMUNE

## 2023-09-18 LAB — OB RESULTS CONSOLE HEPATITIS B SURFACE ANTIGEN: Hepatitis B Surface Ag: NEGATIVE

## 2023-09-18 LAB — HEPATITIS C ANTIBODY: HCV Ab: NEGATIVE

## 2023-09-18 LAB — OB RESULTS CONSOLE GC/CHLAMYDIA
Chlamydia: NEGATIVE
Neisseria Gonorrhea: NEGATIVE

## 2023-09-18 LAB — OB RESULTS CONSOLE ANTIBODY SCREEN: Antibody Screen: NEGATIVE

## 2023-09-23 ENCOUNTER — Encounter: Payer: Commercial Managed Care - PPO | Admitting: Dermatology

## 2023-09-24 DIAGNOSIS — Z31430 Encounter of female for testing for genetic disease carrier status for procreative management: Secondary | ICD-10-CM | POA: Diagnosis not present

## 2023-09-24 DIAGNOSIS — O09511 Supervision of elderly primigravida, first trimester: Secondary | ICD-10-CM | POA: Diagnosis not present

## 2023-09-26 IMAGING — DX DG HAND COMPLETE 3+V*L*
3 series · 3 of 3 positions shown · non-contrast
Comparison: None Available.

CLINICAL DATA: Prolonged morning stiffness, swelling of
metacarpophalangeal joints, rule out erosive changes

EXAM:
LEFT HAND - COMPLETE 3+ VIEW; RIGHT HAND - COMPLETE 3+ VIEW

[hand pa]
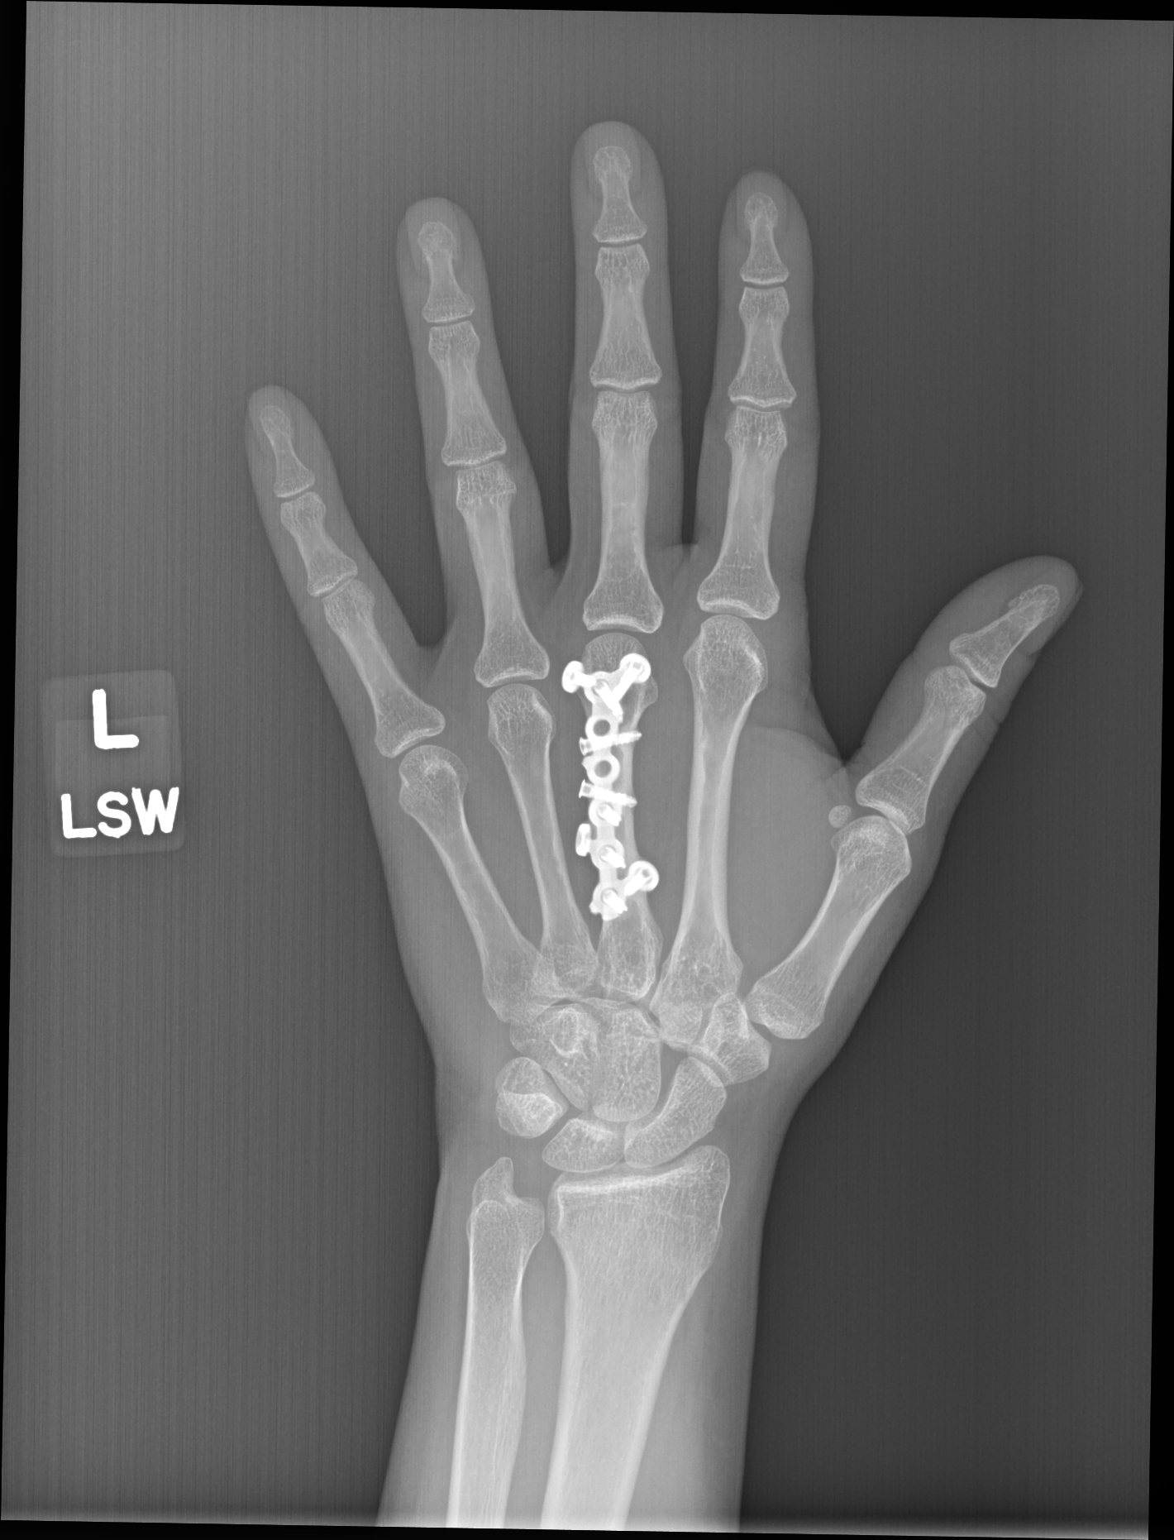

[hand obl (oblique)]
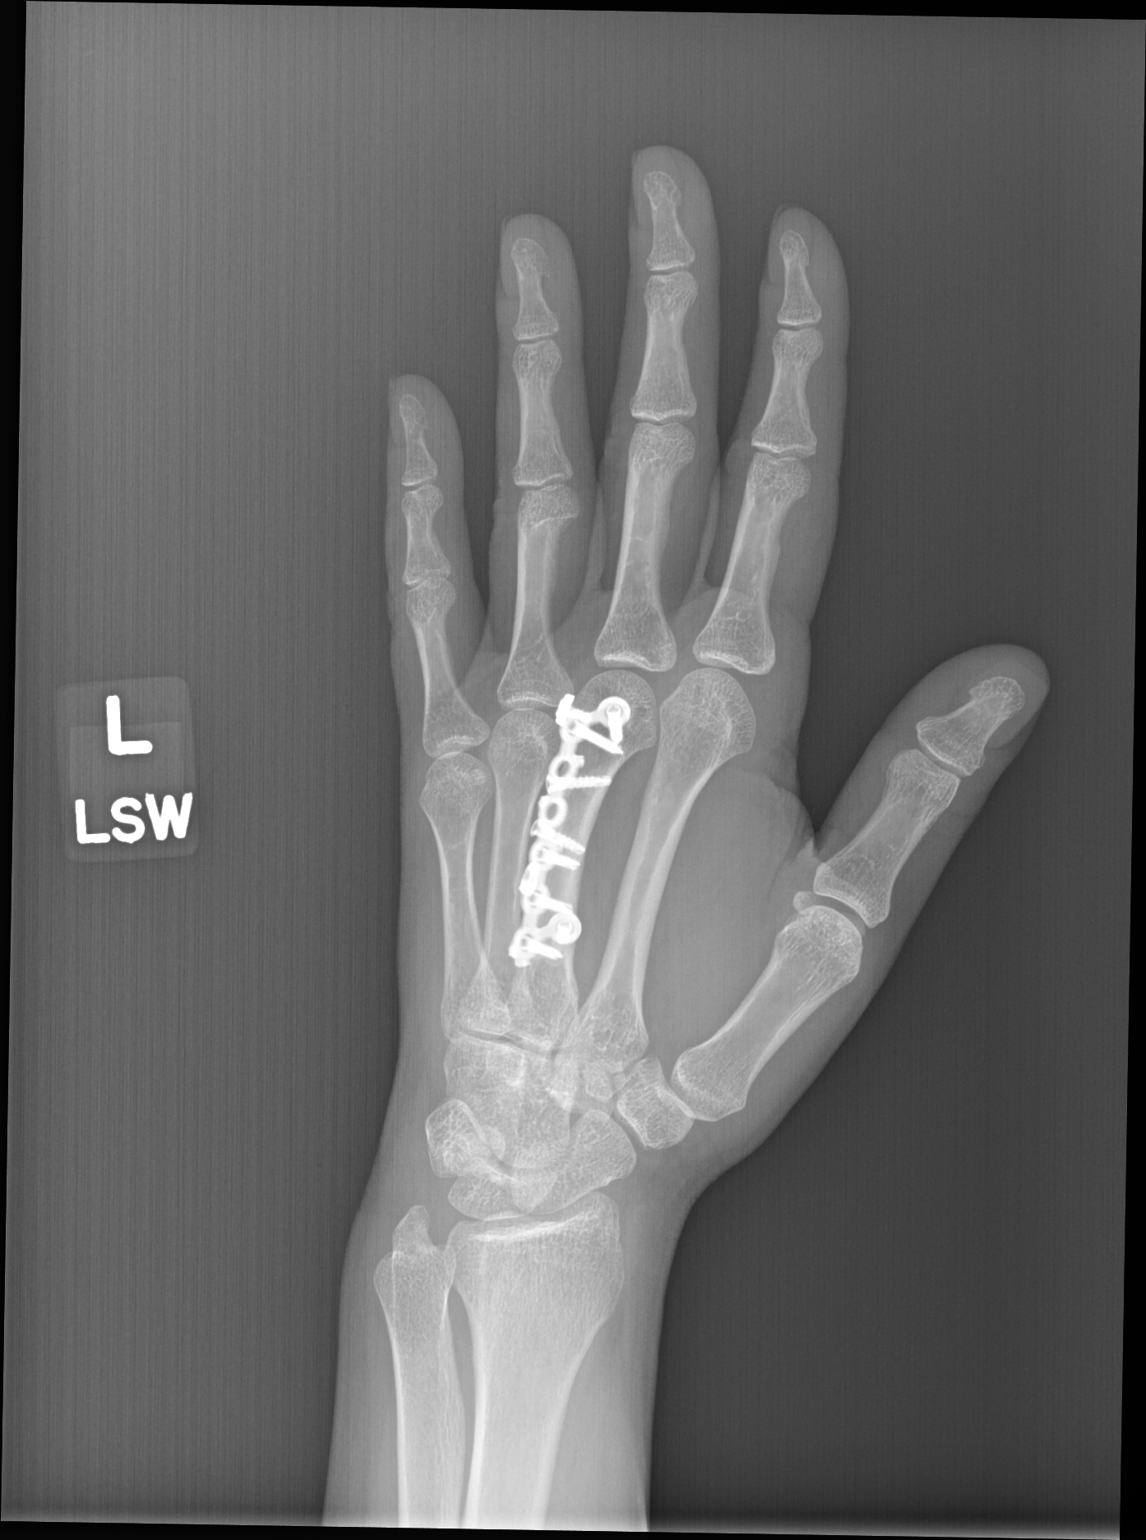

[hand lat]
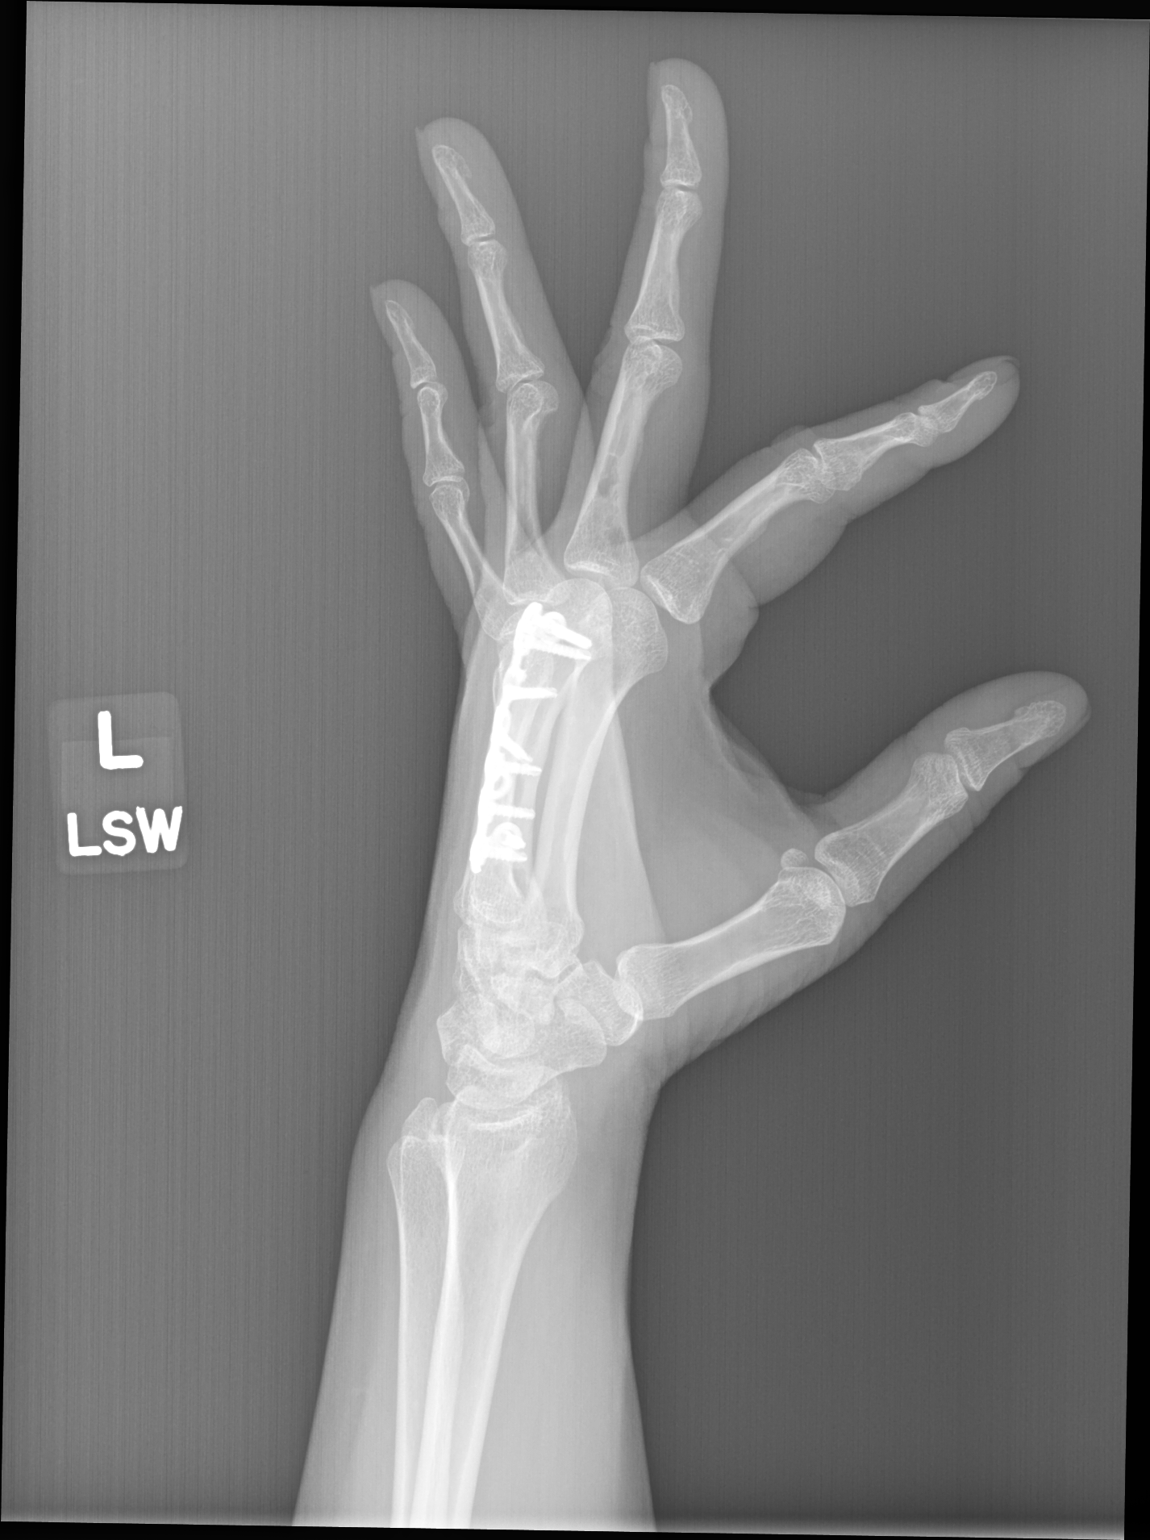

[3 of 3 positions shown; findings below may reference images not displayed]

FINDINGS: There is no evidence of acute fracture or dislocation. Plate and
screw reduction of the left third metacarpal. There is no evidence
of arthropathy or other focal bone abnormality. Soft tissues are
unremarkable.
IMPRESSION: 1.  No acute fracture or dislocation of the bilateral hands.

2. Joint spaces are well preserved; specifically no evidence of
erosion.

3.  Plate and screw reduction of the left third metacarpal.

## 2023-09-26 IMAGING — DX DG HAND COMPLETE 3+V*R*
3 series · 3 of 3 positions shown · non-contrast
Comparison: None Available.

CLINICAL DATA: Prolonged morning stiffness, swelling of
metacarpophalangeal joints, rule out erosive changes

EXAM:
LEFT HAND - COMPLETE 3+ VIEW; RIGHT HAND - COMPLETE 3+ VIEW

[hand pa]
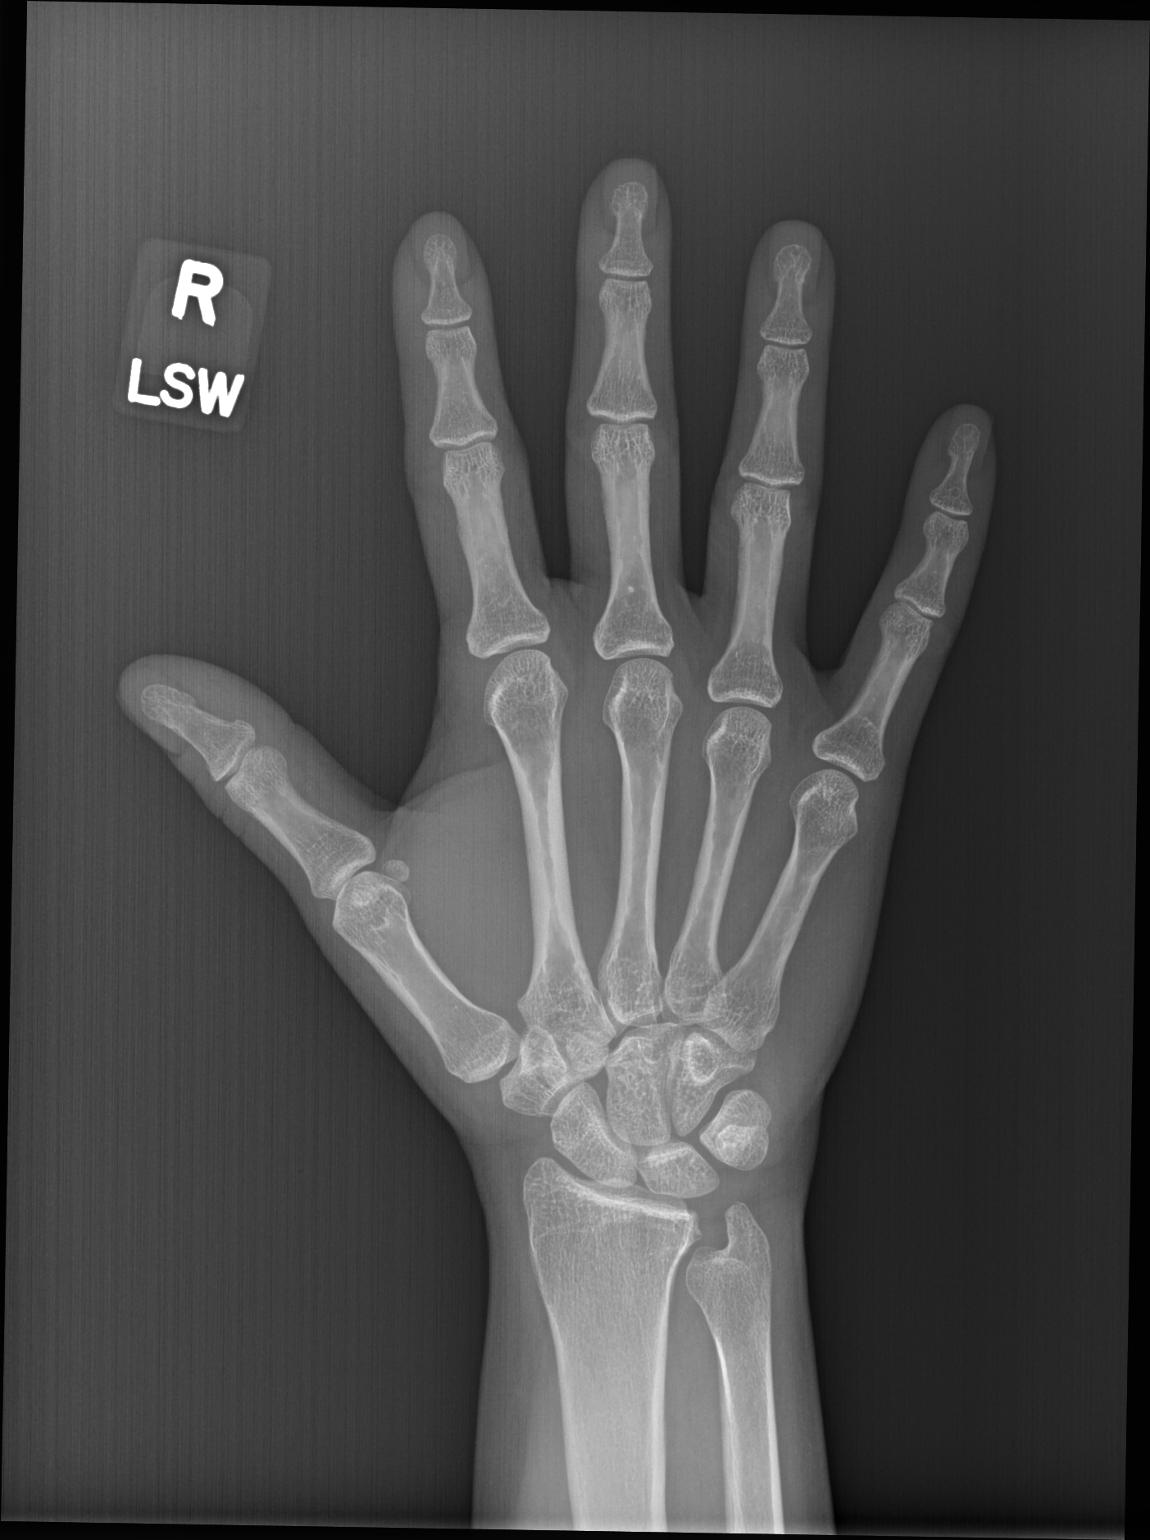

[hand obl (oblique)]
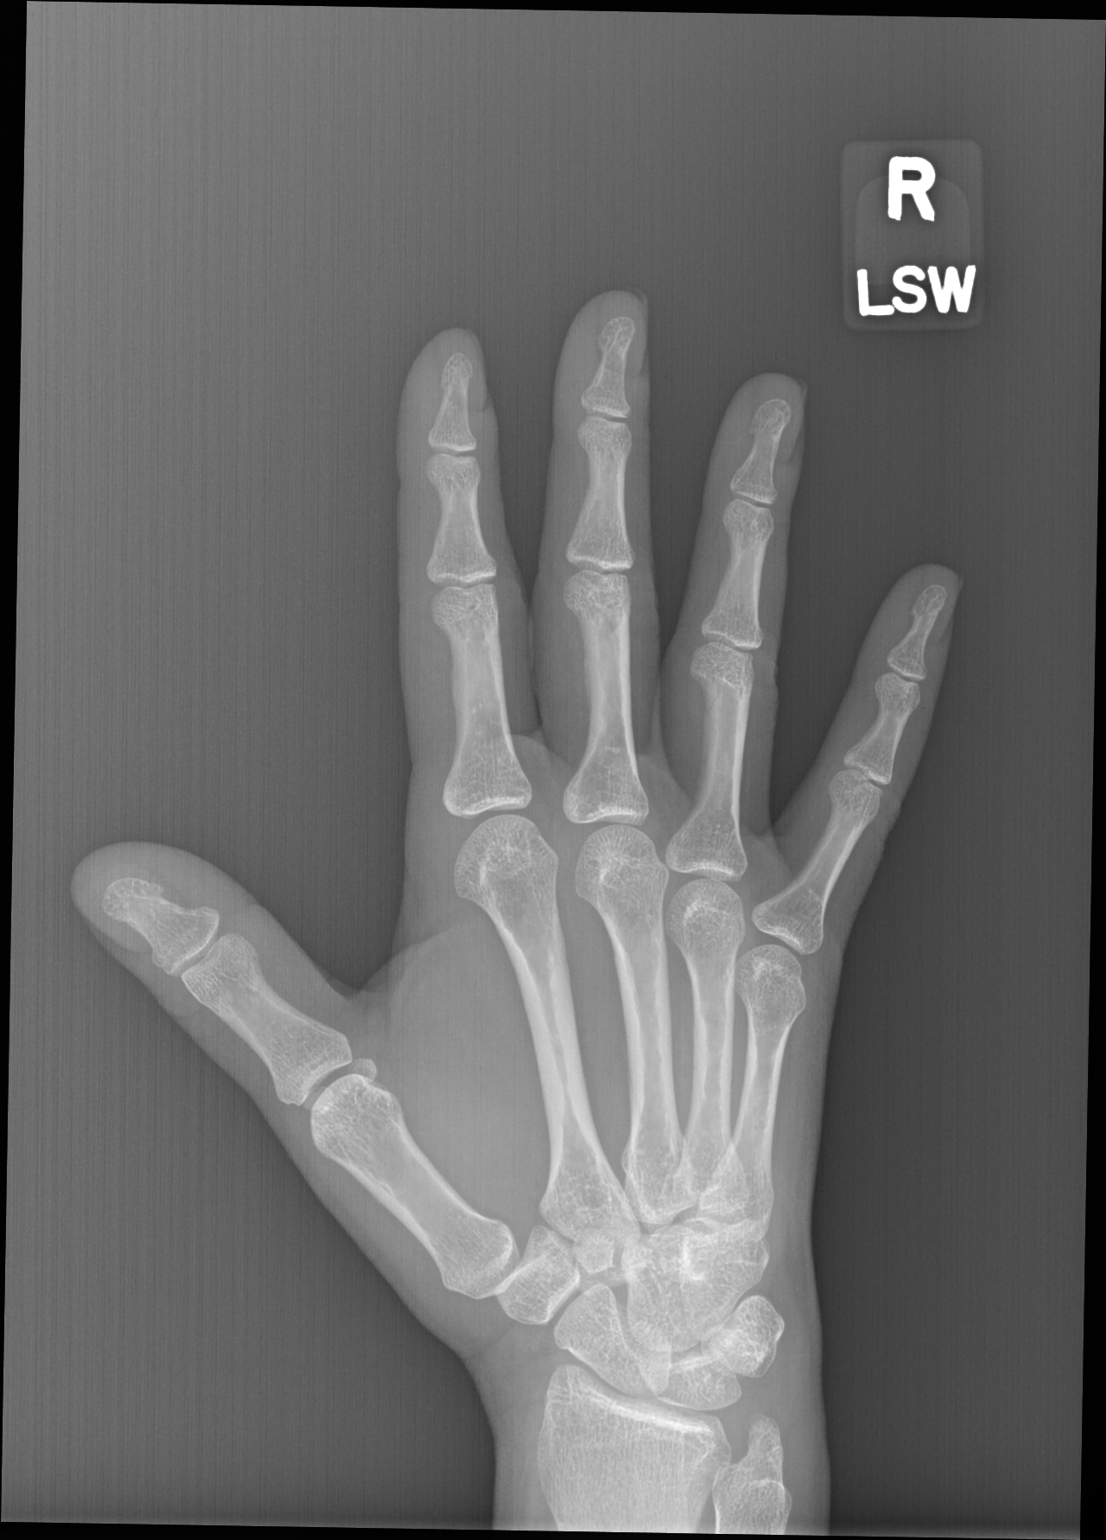

[hand lat]
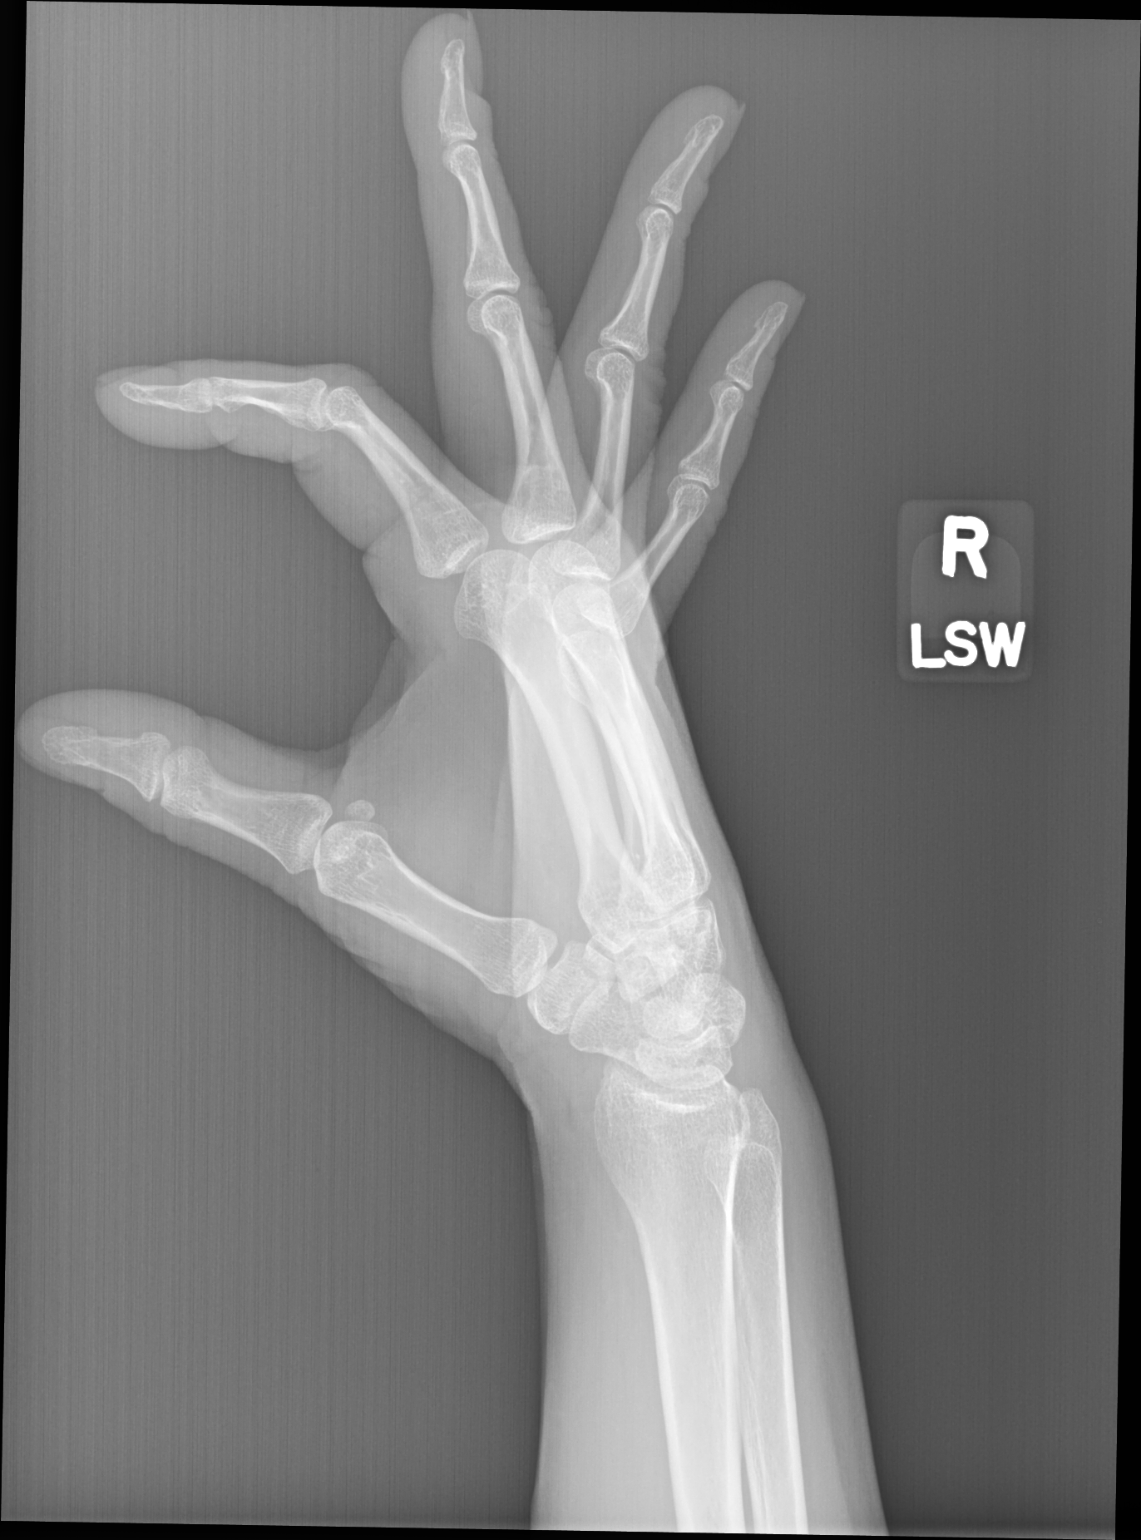

[3 of 3 positions shown; findings below may reference images not displayed]

FINDINGS: There is no evidence of acute fracture or dislocation. Plate and
screw reduction of the left third metacarpal. There is no evidence
of arthropathy or other focal bone abnormality. Soft tissues are
unremarkable.
IMPRESSION: 1.  No acute fracture or dislocation of the bilateral hands.

2. Joint spaces are well preserved; specifically no evidence of
erosion.

3.  Plate and screw reduction of the left third metacarpal.

## 2023-09-29 ENCOUNTER — Telehealth: Payer: Self-pay

## 2023-09-29 ENCOUNTER — Other Ambulatory Visit: Payer: Self-pay | Admitting: Obstetrics and Gynecology

## 2023-09-29 ENCOUNTER — Other Ambulatory Visit: Payer: Self-pay

## 2023-09-29 ENCOUNTER — Other Ambulatory Visit: Payer: Self-pay | Admitting: Internal Medicine

## 2023-09-29 ENCOUNTER — Other Ambulatory Visit (HOSPITAL_COMMUNITY): Payer: Self-pay

## 2023-09-29 DIAGNOSIS — O09519 Supervision of elderly primigravida, unspecified trimester: Secondary | ICD-10-CM

## 2023-09-29 MED ORDER — CYCLOBENZAPRINE HCL 10 MG PO TABS
10.0000 mg | ORAL_TABLET | Freq: Three times a day (TID) | ORAL | 5 refills | Status: DC | PRN
  Filled 2023-09-29: qty 30, 10d supply, fill #0
  Filled 2023-10-25: qty 30, 10d supply, fill #1
  Filled 2023-12-01: qty 30, 10d supply, fill #2
  Filled 2024-01-06: qty 30, 10d supply, fill #3
  Filled 2024-02-13: qty 30, 10d supply, fill #4
  Filled 2024-03-15: qty 30, 10d supply, fill #5

## 2023-09-29 MED ORDER — LEVOTHYROXINE SODIUM 75 MCG PO TABS
75.0000 ug | ORAL_TABLET | Freq: Every day | ORAL | 1 refills | Status: DC
  Filled 2023-09-29: qty 90, 90d supply, fill #0
  Filled 2023-12-24: qty 90, 90d supply, fill #1

## 2023-10-16 ENCOUNTER — Other Ambulatory Visit (HOSPITAL_COMMUNITY): Payer: Self-pay

## 2023-10-16 DIAGNOSIS — Z369 Encounter for antenatal screening, unspecified: Secondary | ICD-10-CM | POA: Diagnosis not present

## 2023-10-16 DIAGNOSIS — E038 Other specified hypothyroidism: Secondary | ICD-10-CM | POA: Diagnosis not present

## 2023-10-16 DIAGNOSIS — Z3482 Encounter for supervision of other normal pregnancy, second trimester: Secondary | ICD-10-CM | POA: Diagnosis not present

## 2023-10-16 MED ORDER — LEVOTHYROXINE SODIUM 75 MCG PO TABS
75.0000 ug | ORAL_TABLET | Freq: Every day | ORAL | 3 refills | Status: DC
  Filled 2023-10-16 – 2023-10-25 (×2): qty 30, 30d supply, fill #0

## 2023-10-17 ENCOUNTER — Other Ambulatory Visit (HOSPITAL_COMMUNITY): Payer: Self-pay

## 2023-10-25 ENCOUNTER — Other Ambulatory Visit (HOSPITAL_COMMUNITY): Payer: Self-pay

## 2023-10-26 ENCOUNTER — Other Ambulatory Visit: Payer: Self-pay

## 2023-10-28 ENCOUNTER — Other Ambulatory Visit: Payer: Self-pay

## 2023-10-28 ENCOUNTER — Other Ambulatory Visit (HOSPITAL_COMMUNITY): Payer: Self-pay

## 2023-10-28 DIAGNOSIS — F331 Major depressive disorder, recurrent, moderate: Secondary | ICD-10-CM | POA: Diagnosis not present

## 2023-10-28 DIAGNOSIS — G4700 Insomnia, unspecified: Secondary | ICD-10-CM | POA: Diagnosis not present

## 2023-10-28 DIAGNOSIS — Z331 Pregnant state, incidental: Secondary | ICD-10-CM | POA: Diagnosis not present

## 2023-10-28 DIAGNOSIS — F411 Generalized anxiety disorder: Secondary | ICD-10-CM | POA: Diagnosis not present

## 2023-10-28 DIAGNOSIS — F902 Attention-deficit hyperactivity disorder, combined type: Secondary | ICD-10-CM | POA: Diagnosis not present

## 2023-10-28 MED ORDER — FLUOXETINE HCL 20 MG PO CAPS
20.0000 mg | ORAL_CAPSULE | Freq: Every morning | ORAL | 1 refills | Status: DC
  Filled 2023-10-28: qty 90, 90d supply, fill #0

## 2023-11-05 ENCOUNTER — Other Ambulatory Visit: Payer: Self-pay | Admitting: Internal Medicine

## 2023-11-06 ENCOUNTER — Other Ambulatory Visit: Payer: Self-pay

## 2023-11-06 MED ORDER — FAMOTIDINE 20 MG PO TABS
20.0000 mg | ORAL_TABLET | Freq: Two times a day (BID) | ORAL | 1 refills | Status: AC
  Filled 2023-11-06 – 2024-02-05 (×2): qty 180, 90d supply, fill #0

## 2023-11-07 ENCOUNTER — Other Ambulatory Visit (HOSPITAL_COMMUNITY): Payer: Self-pay

## 2023-11-07 DIAGNOSIS — O99210 Obesity complicating pregnancy, unspecified trimester: Secondary | ICD-10-CM | POA: Insufficient documentation

## 2023-11-07 DIAGNOSIS — O09891 Supervision of other high risk pregnancies, first trimester: Secondary | ICD-10-CM | POA: Insufficient documentation

## 2023-11-13 DIAGNOSIS — Z369 Encounter for antenatal screening, unspecified: Secondary | ICD-10-CM | POA: Diagnosis not present

## 2023-11-14 ENCOUNTER — Other Ambulatory Visit

## 2023-11-14 ENCOUNTER — Ambulatory Visit

## 2023-11-18 ENCOUNTER — Ambulatory Visit: Attending: Obstetrics and Gynecology | Admitting: Maternal & Fetal Medicine

## 2023-11-18 ENCOUNTER — Encounter: Payer: Self-pay | Admitting: Obstetrics and Gynecology

## 2023-11-18 ENCOUNTER — Ambulatory Visit

## 2023-11-18 ENCOUNTER — Other Ambulatory Visit: Payer: Self-pay | Admitting: *Deleted

## 2023-11-18 VITALS — BP 125/79 | HR 103

## 2023-11-18 DIAGNOSIS — O09892 Supervision of other high risk pregnancies, second trimester: Secondary | ICD-10-CM | POA: Insufficient documentation

## 2023-11-18 DIAGNOSIS — O99212 Obesity complicating pregnancy, second trimester: Secondary | ICD-10-CM | POA: Diagnosis not present

## 2023-11-18 DIAGNOSIS — Z363 Encounter for antenatal screening for malformations: Secondary | ICD-10-CM | POA: Diagnosis not present

## 2023-11-18 DIAGNOSIS — M069 Rheumatoid arthritis, unspecified: Secondary | ICD-10-CM

## 2023-11-18 DIAGNOSIS — E039 Hypothyroidism, unspecified: Secondary | ICD-10-CM | POA: Diagnosis not present

## 2023-11-18 DIAGNOSIS — O99282 Endocrine, nutritional and metabolic diseases complicating pregnancy, second trimester: Secondary | ICD-10-CM

## 2023-11-18 DIAGNOSIS — O09519 Supervision of elderly primigravida, unspecified trimester: Secondary | ICD-10-CM

## 2023-11-18 DIAGNOSIS — O09891 Supervision of other high risk pregnancies, first trimester: Secondary | ICD-10-CM

## 2023-11-18 DIAGNOSIS — O99210 Obesity complicating pregnancy, unspecified trimester: Secondary | ICD-10-CM

## 2023-11-18 DIAGNOSIS — Z3A19 19 weeks gestation of pregnancy: Secondary | ICD-10-CM

## 2023-11-18 DIAGNOSIS — Z720 Tobacco use: Secondary | ICD-10-CM

## 2023-11-18 DIAGNOSIS — Z362 Encounter for other antenatal screening follow-up: Secondary | ICD-10-CM

## 2023-11-18 DIAGNOSIS — O99332 Smoking (tobacco) complicating pregnancy, second trimester: Secondary | ICD-10-CM | POA: Diagnosis not present

## 2023-11-18 DIAGNOSIS — O09512 Supervision of elderly primigravida, second trimester: Secondary | ICD-10-CM | POA: Diagnosis not present

## 2023-11-18 DIAGNOSIS — O09522 Supervision of elderly multigravida, second trimester: Secondary | ICD-10-CM | POA: Insufficient documentation

## 2023-11-18 DIAGNOSIS — E669 Obesity, unspecified: Secondary | ICD-10-CM | POA: Diagnosis not present
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
## 2023-11-19 ENCOUNTER — Other Ambulatory Visit: Payer: Self-pay

## 2023-11-19 DIAGNOSIS — M069 Rheumatoid arthritis, unspecified: Secondary | ICD-10-CM | POA: Insufficient documentation

## 2023-11-19 MED ORDER — NICOTINE 7 MG/24HR TD PT24
7.0000 mg | MEDICATED_PATCH | Freq: Every day | TRANSDERMAL | 5 refills | Status: DC
  Filled 2023-11-19: qty 28, 28d supply, fill #0
  Filled 2023-12-24: qty 28, 28d supply, fill #1
  Filled 2024-03-23: qty 28, 28d supply, fill #2

## 2023-11-26 ENCOUNTER — Ambulatory Visit: Payer: Commercial Managed Care - PPO | Admitting: Rheumatology

## 2023-12-02 ENCOUNTER — Other Ambulatory Visit: Payer: Self-pay
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
## 2023-12-09 DIAGNOSIS — Z369 Encounter for antenatal screening, unspecified: Secondary | ICD-10-CM | POA: Diagnosis not present

## 2023-12-17 ENCOUNTER — Ambulatory Visit: Admitting: Rheumatology

## 2023-12-17 DIAGNOSIS — K5909 Other constipation: Secondary | ICD-10-CM

## 2023-12-17 DIAGNOSIS — R002 Palpitations: Secondary | ICD-10-CM

## 2023-12-17 DIAGNOSIS — G8929 Other chronic pain: Secondary | ICD-10-CM

## 2023-12-17 DIAGNOSIS — M1711 Unilateral primary osteoarthritis, right knee: Secondary | ICD-10-CM

## 2023-12-17 DIAGNOSIS — Z79899 Other long term (current) drug therapy: Secondary | ICD-10-CM

## 2023-12-17 DIAGNOSIS — E559 Vitamin D deficiency, unspecified: Secondary | ICD-10-CM

## 2023-12-17 DIAGNOSIS — M79641 Pain in right hand: Secondary | ICD-10-CM

## 2023-12-17 DIAGNOSIS — R590 Localized enlarged lymph nodes: Secondary | ICD-10-CM

## 2023-12-17 DIAGNOSIS — F17200 Nicotine dependence, unspecified, uncomplicated: Secondary | ICD-10-CM

## 2023-12-17 DIAGNOSIS — E781 Pure hyperglyceridemia: Secondary | ICD-10-CM

## 2023-12-17 DIAGNOSIS — M059 Rheumatoid arthritis with rheumatoid factor, unspecified: Secondary | ICD-10-CM

## 2023-12-17 DIAGNOSIS — Z803 Family history of malignant neoplasm of breast: Secondary | ICD-10-CM

## 2023-12-17 DIAGNOSIS — M23611 Other spontaneous disruption of anterior cruciate ligament of right knee: Secondary | ICD-10-CM

## 2023-12-17 DIAGNOSIS — E063 Autoimmune thyroiditis: Secondary | ICD-10-CM

## 2023-12-17 DIAGNOSIS — M545 Low back pain, unspecified: Secondary | ICD-10-CM

## 2023-12-17 DIAGNOSIS — M7918 Myalgia, other site: Secondary | ICD-10-CM

## 2023-12-17 DIAGNOSIS — F419 Anxiety disorder, unspecified: Secondary | ICD-10-CM

## 2023-12-23 ENCOUNTER — Ambulatory Visit: Attending: Obstetrics and Gynecology

## 2023-12-23 ENCOUNTER — Ambulatory Visit (HOSPITAL_BASED_OUTPATIENT_CLINIC_OR_DEPARTMENT_OTHER)

## 2023-12-23 VITALS — BP 121/74

## 2023-12-23 DIAGNOSIS — O09512 Supervision of elderly primigravida, second trimester: Secondary | ICD-10-CM | POA: Diagnosis not present

## 2023-12-23 DIAGNOSIS — Z362 Encounter for other antenatal screening follow-up: Secondary | ICD-10-CM | POA: Diagnosis not present

## 2023-12-23 DIAGNOSIS — E669 Obesity, unspecified: Secondary | ICD-10-CM | POA: Diagnosis not present

## 2023-12-23 DIAGNOSIS — O99212 Obesity complicating pregnancy, second trimester: Secondary | ICD-10-CM | POA: Insufficient documentation

## 2023-12-23 DIAGNOSIS — O99280 Endocrine, nutritional and metabolic diseases complicating pregnancy, unspecified trimester: Secondary | ICD-10-CM | POA: Diagnosis not present

## 2023-12-23 DIAGNOSIS — O09522 Supervision of elderly multigravida, second trimester: Secondary | ICD-10-CM | POA: Insufficient documentation

## 2023-12-23 DIAGNOSIS — E039 Hypothyroidism, unspecified: Secondary | ICD-10-CM | POA: Diagnosis not present

## 2023-12-23 DIAGNOSIS — Z3A24 24 weeks gestation of pregnancy: Secondary | ICD-10-CM | POA: Diagnosis not present

## 2023-12-23 DIAGNOSIS — O99282 Endocrine, nutritional and metabolic diseases complicating pregnancy, second trimester: Secondary | ICD-10-CM

## 2023-12-23 NOTE — Progress Notes (Signed)
 MFM Consult Note  Sonia Holden is currently at 24 weeks and 5 days.  She has been followed due to advanced maternal age (38 years old) and maternal obesity with a BMI of 35.  She also has a history of rheumatoid arthritis and hypothyroidism.    She denies any problems since her last exam.  On today's exam, the overall EFW of 2 pounds 1 ounces measures at the 97th percentile for her gestational age.    There was normal amniotic fluid noted.    Due to the large for gestational age fetus, she should be screened for gestational diabetes at her next prenatal visit.    Due to advanced maternal age, we will start weekly fetal testing at 34 weeks.    She will return in 4 weeks for another growth scan.  The patient stated that all of her questions were answered today.  A total of 20 minutes was spent counseling and coordinating the care for this patient.  Greater than 50% of the time was spent in direct face-to-face contact.

## 2023-12-24 ENCOUNTER — Other Ambulatory Visit (HOSPITAL_COMMUNITY): Payer: Self-pay

## 2023-12-24 ENCOUNTER — Other Ambulatory Visit: Payer: Self-pay | Admitting: *Deleted

## 2023-12-24 ENCOUNTER — Other Ambulatory Visit: Payer: Self-pay

## 2023-12-24 DIAGNOSIS — F331 Major depressive disorder, recurrent, moderate: Secondary | ICD-10-CM | POA: Diagnosis not present

## 2023-12-24 DIAGNOSIS — F411 Generalized anxiety disorder: Secondary | ICD-10-CM | POA: Diagnosis not present

## 2023-12-24 DIAGNOSIS — Z331 Pregnant state, incidental: Secondary | ICD-10-CM | POA: Diagnosis not present

## 2023-12-24 DIAGNOSIS — F902 Attention-deficit hyperactivity disorder, combined type: Secondary | ICD-10-CM | POA: Diagnosis not present

## 2023-12-24 DIAGNOSIS — G4700 Insomnia, unspecified: Secondary | ICD-10-CM | POA: Diagnosis not present

## 2023-12-24 DIAGNOSIS — O09522 Supervision of elderly multigravida, second trimester: Secondary | ICD-10-CM

## 2023-12-24 MED ORDER — DEXMETHYLPHENIDATE HCL ER 20 MG PO CP24
20.0000 mg | ORAL_CAPSULE | Freq: Every morning | ORAL | 0 refills | Status: DC
  Filled 2023-12-24: qty 30, 30d supply, fill #0

## 2024-01-06 ENCOUNTER — Other Ambulatory Visit (HOSPITAL_COMMUNITY): Payer: Self-pay

## 2024-01-07 NOTE — Progress Notes (Signed)
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 Plaquenil  sent to the pharmacy on 01/16/2024.  She will notify us  if she develops any signs or symptoms of a flare.  She will follow-up  in the office in 4 to 5 months or sooner if needed.  Rheumatoid factor positive - Rheumatoid factor negative on 11/02/2021.  Positive anti-CCP test - Anti-CCP 81 on 11/02/2021.  High risk medication use - Plaquenil  200 mg 1 tablet by  mouth twice daily Monday through Friday.  Discussed that Plaquenil  is safe during pregnancy and while breast-feeding. PLQ Eye Exam: 01/01/2023 WNL @ [facility] Follow up in 1 year.  She has a Plaquenil  examination scheduled on 02/19/2024.  CBC was drawn on 01/08/2024. CMP updated today.  Plan: Comprehensive metabolic panel with GFR  Positive ANA (antinuclear antibody) - ANA 1: 80 NH on 11/02/2021. ENA negative.  No clinical features of systemic lupus.  Thyroglobulin antibody positive: TSH 3.28 on 01/08/2024.  Vitamin D  deficiency: Vitamin D  was 29 on 06/10/2023.  Myofascial pain: Intermittent discomfort.  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 Sharl.  No warmth or effusion noted.  Primary osteoarthritis of right knee: No warmth or effusion noted today.  Lumbar back pain: Intermittent discomfort.  Other medical conditions are listed as follows:  Palpitations  Insomnia secondary to anxiety  Chronic constipation  Family history of breast cancer in first degree relative  Cervical lymphadenopathy  Other fatigue  Anxiety  Smoker  Hypertriglyceridemia  Acquired autoimmune hypothyroidism  Orders: Orders Placed This Encounter  Procedures   Comprehensive metabolic panel with GFR   No orders of the defined types were placed in this encounter.  Follow-Up Instructions: No follow-ups on file.   Waddell CHRISTELLA Craze, PA-C  Note - This record has been created using Dragon software.  Chart creation errors have been sought, but may not always  have been located. Such creation errors do not reflect on  the standard of medical care.

## 2024-01-08 DIAGNOSIS — Z3480 Encounter for supervision of other normal pregnancy, unspecified trimester: Secondary | ICD-10-CM | POA: Diagnosis not present

## 2024-01-08 DIAGNOSIS — Z23 Encounter for immunization: Secondary | ICD-10-CM | POA: Diagnosis not present

## 2024-01-08 DIAGNOSIS — E039 Hypothyroidism, unspecified: Secondary | ICD-10-CM | POA: Diagnosis not present

## 2024-01-09 ENCOUNTER — Other Ambulatory Visit (HOSPITAL_COMMUNITY): Payer: Self-pay

## 2024-01-09 ENCOUNTER — Other Ambulatory Visit: Payer: Self-pay

## 2024-01-09 ENCOUNTER — Other Ambulatory Visit: Payer: Self-pay | Admitting: Internal Medicine

## 2024-01-09 MED ORDER — LEVOTHYROXINE SODIUM 88 MCG PO TABS
88.0000 ug | ORAL_TABLET | Freq: Every day | ORAL | 1 refills | Status: DC
  Filled 2024-01-09: qty 30, 30d supply, fill #0
  Filled 2024-02-05: qty 30, 30d supply, fill #1

## 2024-01-09 MED ORDER — LEVOTHYROXINE SODIUM 88 MCG PO TABS
88.0000 ug | ORAL_TABLET | Freq: Every day | ORAL | 1 refills | Status: AC
  Filled 2024-03-03: qty 30, 30d supply, fill #0

## 2024-01-12 NOTE — Telephone Encounter (Signed)
 Medication was previously prescribed by a different provider.

## 2024-01-14 DIAGNOSIS — O99810 Abnormal glucose complicating pregnancy: Secondary | ICD-10-CM | POA: Diagnosis not present

## 2024-01-15 ENCOUNTER — Other Ambulatory Visit (HOSPITAL_BASED_OUTPATIENT_CLINIC_OR_DEPARTMENT_OTHER): Payer: Self-pay

## 2024-01-15 ENCOUNTER — Other Ambulatory Visit (HOSPITAL_COMMUNITY): Payer: Self-pay

## 2024-01-15 LAB — LAB REPORT - SCANNED
A1c: 5.2
HM HIV Screening: NEGATIVE
HM Hepatitis Screen: NEGATIVE

## 2024-01-15 MED ORDER — ACCU-CHEK GUIDE TEST VI STRP
ORAL_STRIP | 4 refills | Status: DC
  Filled 2024-01-15: qty 100, 25d supply, fill #0
  Filled 2024-02-13: qty 100, 25d supply, fill #1
  Filled 2024-03-23: qty 100, 25d supply, fill #2

## 2024-01-15 MED ORDER — FREESTYLE LITE W/DEVICE KIT
PACK | 0 refills | Status: DC
  Filled 2024-01-15: qty 1, 30d supply, fill #0

## 2024-01-15 MED ORDER — FREESTYLE LANCETS MISC
4 refills | Status: DC
  Filled 2024-01-15: qty 100, 25d supply, fill #0
  Filled 2024-03-23: qty 100, 25d supply, fill #1

## 2024-01-16 ENCOUNTER — Other Ambulatory Visit (HOSPITAL_COMMUNITY): Payer: Self-pay

## 2024-01-16 ENCOUNTER — Other Ambulatory Visit: Payer: Self-pay

## 2024-01-16 ENCOUNTER — Other Ambulatory Visit: Payer: Self-pay | Admitting: Physician Assistant

## 2024-01-16 MED ORDER — HYDROXYCHLOROQUINE SULFATE 200 MG PO TABS
200.0000 mg | ORAL_TABLET | Freq: Two times a day (BID) | ORAL | 2 refills | Status: DC
Start: 2024-01-16 — End: 2024-03-10
  Filled 2024-01-16: qty 40, 28d supply, fill #0
  Filled 2024-03-03: qty 40, 28d supply, fill #1

## 2024-01-16 NOTE — Telephone Encounter (Signed)
 Last Fill: 08/28/2023  Eye exam: 01/01/2023 WNL   Labs: 06/10/2023  WBC count remains elevated-trending down.  Absolute neutrophils are elevated--any recent bacterial infection?  Rest of CBC WNL.  CMP WNL  Thyroid  panel WNL  Vitamin D  is borderline low-29-please clarify if she is currently taking a vitamin D  supplement?  ESR and CRP are WNL-reassuring that inflammation is under control.  If her hand pain persists or worsens we can schedule a repeat ultrasound of both hands to assess for active synovitis.   Lipid panel and HgbA1c updated as requested-results should be forwarded to PCP for upcoming yearly physical   Next Visit: 01/21/2024  Last Visit: 06/10/2023  DX: Inflammatory arthritis   Current Dose per office note 06/10/2023: Plaquenil  200 mg 1 tablet by mouth twice daily Monday through Friday.   Okay to refill Plaquenil ?   Contacted patient to advise that labs and her PLQ eye exam was due, patient stated she has an eye exam on 02/19/2024 with [facility] and she is currently pregnant an will have her recent labs faxed to the office.

## 2024-01-17 ENCOUNTER — Other Ambulatory Visit (HOSPITAL_COMMUNITY): Payer: Self-pay

## 2024-01-20 ENCOUNTER — Ambulatory Visit (HOSPITAL_BASED_OUTPATIENT_CLINIC_OR_DEPARTMENT_OTHER): Admitting: Obstetrics and Gynecology

## 2024-01-20 ENCOUNTER — Ambulatory Visit: Attending: Obstetrics

## 2024-01-20 VITALS — BP 136/92 | HR 124

## 2024-01-20 DIAGNOSIS — O3663X0 Maternal care for excessive fetal growth, third trimester, not applicable or unspecified: Secondary | ICD-10-CM

## 2024-01-20 DIAGNOSIS — O24410 Gestational diabetes mellitus in pregnancy, diet controlled: Secondary | ICD-10-CM

## 2024-01-20 DIAGNOSIS — O99213 Obesity complicating pregnancy, third trimester: Secondary | ICD-10-CM

## 2024-01-20 DIAGNOSIS — O09522 Supervision of elderly multigravida, second trimester: Secondary | ICD-10-CM | POA: Diagnosis not present

## 2024-01-20 DIAGNOSIS — O24419 Gestational diabetes mellitus in pregnancy, unspecified control: Secondary | ICD-10-CM

## 2024-01-20 DIAGNOSIS — E669 Obesity, unspecified: Secondary | ICD-10-CM

## 2024-01-20 DIAGNOSIS — O09523 Supervision of elderly multigravida, third trimester: Secondary | ICD-10-CM

## 2024-01-20 DIAGNOSIS — Z3A28 28 weeks gestation of pregnancy: Secondary | ICD-10-CM | POA: Insufficient documentation

## 2024-01-20 DIAGNOSIS — O09891 Supervision of other high risk pregnancies, first trimester: Secondary | ICD-10-CM

## 2024-01-20 NOTE — Progress Notes (Signed)
 Maternal-Fetal Medicine Consultation Name: Sonia Holden MRN: [id number]  G2 P0010 at 28w 5d gestation. Patient has a new diagnosis of gestational diabetes.  She has just started checking her blood glucose levels from today. Blood pressure today at our office he is 136/92 mmHg. Advanced maternal age >40 years).  Ultrasound On today's ultrasound, the estimated fetal weight is at the 97th percentile and the abdominal circumference measurement at the 94th percentile.  Amniotic fluid is normal with good fetal activity seen.  Cephalic presentation.  Gestational diabetes I emphasized the importance of good blood glucose control to prevent adverse fetal or neonatal outcomes.  I discussed normal range of blood glucose values. I encouraged her to check her blood glucose regularly. Possible complications of gestational diabetes include fetal macrosomia, shoulder dystocia and birth injuries, stillbirth (in poorly controlled diabetes) and neonatal respiratory syndrome and other complications.  In about 85% of cases, gestational diabetes is well controlled by diet alone.  Exercise reduces the need for insulin.  Medical treatment includes oral hypoglycemics or insulin.  Timing of delivery: In well-controlled diabetes on diet, patient can be delivered at 63- or 40-weeks' gestation. Early term delivery at 37-or 38-weeks' gestation should be considered if diabetes is not well controlled. Vaginal delivery can be safely attempted. Type 2 diabetes develops in up to 50% of women with GDM. I recommend postpartum screening with 75-g glucose load at 6 to 12 weeks after delivery.  Recommendations -Appointment was made for her to return in 4 weeks for fetal growth assessment and BPP. - Weekly BPP from [redacted] weeks gestation until delivery.  Consultation including face-to-face (more than 50%) counseling 20 minutes.

## 2024-01-21 ENCOUNTER — Ambulatory Visit: Attending: Physician Assistant | Admitting: Physician Assistant

## 2024-01-21 ENCOUNTER — Other Ambulatory Visit: Payer: Self-pay | Admitting: *Deleted

## 2024-01-21 ENCOUNTER — Encounter: Payer: Self-pay | Admitting: Physician Assistant

## 2024-01-21 VITALS — BP 120/85 | HR 118 | Resp 14 | Ht 67.0 in | Wt 257.0 lb

## 2024-01-21 DIAGNOSIS — M7918 Myalgia, other site: Secondary | ICD-10-CM | POA: Diagnosis not present

## 2024-01-21 DIAGNOSIS — M23611 Other spontaneous disruption of anterior cruciate ligament of right knee: Secondary | ICD-10-CM | POA: Diagnosis not present

## 2024-01-21 DIAGNOSIS — R5383 Other fatigue: Secondary | ICD-10-CM

## 2024-01-21 DIAGNOSIS — R002 Palpitations: Secondary | ICD-10-CM

## 2024-01-21 DIAGNOSIS — F419 Anxiety disorder, unspecified: Secondary | ICD-10-CM

## 2024-01-21 DIAGNOSIS — R768 Other specified abnormal immunological findings in serum: Secondary | ICD-10-CM | POA: Diagnosis not present

## 2024-01-21 DIAGNOSIS — M545 Low back pain, unspecified: Secondary | ICD-10-CM

## 2024-01-21 DIAGNOSIS — Z79899 Other long term (current) drug therapy: Secondary | ICD-10-CM | POA: Diagnosis not present

## 2024-01-21 DIAGNOSIS — R590 Localized enlarged lymph nodes: Secondary | ICD-10-CM

## 2024-01-21 DIAGNOSIS — E781 Pure hyperglyceridemia: Secondary | ICD-10-CM

## 2024-01-21 DIAGNOSIS — E063 Autoimmune thyroiditis: Secondary | ICD-10-CM

## 2024-01-21 DIAGNOSIS — M1990 Unspecified osteoarthritis, unspecified site: Secondary | ICD-10-CM | POA: Diagnosis not present

## 2024-01-21 DIAGNOSIS — F17200 Nicotine dependence, unspecified, uncomplicated: Secondary | ICD-10-CM

## 2024-01-21 DIAGNOSIS — O24419 Gestational diabetes mellitus in pregnancy, unspecified control: Secondary | ICD-10-CM

## 2024-01-21 DIAGNOSIS — M1711 Unilateral primary osteoarthritis, right knee: Secondary | ICD-10-CM | POA: Diagnosis not present

## 2024-01-21 DIAGNOSIS — E559 Vitamin D deficiency, unspecified: Secondary | ICD-10-CM | POA: Diagnosis not present

## 2024-01-21 DIAGNOSIS — O09523 Supervision of elderly multigravida, third trimester: Secondary | ICD-10-CM

## 2024-01-21 DIAGNOSIS — O3663X0 Maternal care for excessive fetal growth, third trimester, not applicable or unspecified: Secondary | ICD-10-CM

## 2024-01-21 DIAGNOSIS — F5105 Insomnia due to other mental disorder: Secondary | ICD-10-CM

## 2024-01-21 DIAGNOSIS — Z803 Family history of malignant neoplasm of breast: Secondary | ICD-10-CM

## 2024-01-21 DIAGNOSIS — K5909 Other constipation: Secondary | ICD-10-CM

## 2024-01-22 ENCOUNTER — Other Ambulatory Visit (HOSPITAL_COMMUNITY): Payer: Self-pay

## 2024-01-22 ENCOUNTER — Ambulatory Visit: Payer: Self-pay | Admitting: Physician Assistant

## 2024-01-22 DIAGNOSIS — F331 Major depressive disorder, recurrent, moderate: Secondary | ICD-10-CM | POA: Diagnosis not present

## 2024-01-22 DIAGNOSIS — F902 Attention-deficit hyperactivity disorder, combined type: Secondary | ICD-10-CM | POA: Diagnosis not present

## 2024-01-22 DIAGNOSIS — F411 Generalized anxiety disorder: Secondary | ICD-10-CM | POA: Diagnosis not present

## 2024-01-22 DIAGNOSIS — Z331 Pregnant state, incidental: Secondary | ICD-10-CM | POA: Diagnosis not present

## 2024-01-22 DIAGNOSIS — G4700 Insomnia, unspecified: Secondary | ICD-10-CM | POA: Diagnosis not present

## 2024-01-22 LAB — COMPREHENSIVE METABOLIC PANEL WITH GFR
AG Ratio: 1.3 (calc) (ref 1.0–2.5)
ALT: 23 U/L (ref 6–29)
AST: 15 U/L (ref 10–30)
Albumin: 3.4 g/dL — ABNORMAL LOW (ref 3.6–5.1)
Alkaline phosphatase (APISO): 87 U/L (ref 31–125)
BUN/Creatinine Ratio: 18 (calc) (ref 6–22)
BUN: 7 mg/dL (ref 7–25)
CO2: 21 mmol/L (ref 20–32)
Calcium: 8.8 mg/dL (ref 8.6–10.2)
Chloride: 106 mmol/L (ref 98–110)
Creat: 0.39 mg/dL — ABNORMAL LOW (ref 0.50–0.99)
Globulin: 2.7 g/dL (ref 1.9–3.7)
Glucose, Bld: 87 mg/dL (ref 65–99)
Potassium: 3.9 mmol/L (ref 3.5–5.3)
Sodium: 136 mmol/L (ref 135–146)
Total Bilirubin: 0.1 mg/dL — ABNORMAL LOW (ref 0.2–1.2)
Total Protein: 6.1 g/dL (ref 6.1–8.1)
eGFR: 129 mL/min/1.73m2 (ref >=60)

## 2024-01-22 MED ORDER — LISDEXAMFETAMINE DIMESYLATE 30 MG PO CAPS
30.0000 mg | ORAL_CAPSULE | Freq: Every morning | ORAL | 0 refills | Status: DC
  Filled 2024-01-22: qty 30, 30d supply, fill #0

## 2024-01-22 MED ORDER — FLUOXETINE HCL 20 MG PO CAPS
20.0000 mg | ORAL_CAPSULE | Freq: Two times a day (BID) | ORAL | 1 refills | Status: AC
  Filled 2024-01-22: qty 180, 90d supply, fill #0
  Filled 2024-06-15: qty 180, 90d supply, fill #1

## 2024-01-22 NOTE — Progress Notes (Signed)
 Albumin is slightly low.  Creatinine is low-may be related to hydration status.  GFR 129.

## 2024-01-23 ENCOUNTER — Other Ambulatory Visit (HOSPITAL_COMMUNITY): Payer: Self-pay

## 2024-01-23 DIAGNOSIS — Z369 Encounter for antenatal screening, unspecified: Secondary | ICD-10-CM | POA: Diagnosis not present

## 2024-01-23 MED ORDER — NICOTINE POLACRILEX 2 MG MT GUM
2.0000 mg | CHEWING_GUM | OROMUCOSAL | 0 refills | Status: AC
  Filled 2024-01-23: qty 110, 10d supply, fill #0

## 2024-01-23 MED ORDER — NICOTINE 21 MG/24HR TD PT24
21.0000 mg | MEDICATED_PATCH | Freq: Every day | TRANSDERMAL | 0 refills | Status: DC
  Filled 2024-01-23: qty 56, 56d supply, fill #0

## 2024-01-23 MED ORDER — DEXCOM G7 SENSOR MISC
11 refills | Status: DC
  Filled 2024-01-23: qty 3, 30d supply, fill #0

## 2024-01-23 MED ORDER — HYDROXYZINE PAMOATE 25 MG PO CAPS
25.0000 mg | ORAL_CAPSULE | Freq: Four times a day (QID) | ORAL | 1 refills | Status: DC | PRN
  Filled 2024-01-23 – 2024-01-28 (×3): qty 30, 8d supply, fill #0

## 2024-01-26 NOTE — Progress Notes (Deleted)
 Patient was seen for *** (Gestational Diabetes/Pre-existing Diabetes During Pregnancy) on ***  Start time *** and End time ***   Estimated due date: ***; ***w***d  Interpreter from ***, Name:  ***, #***  Clinical: Medications: *** Medical History: *** Labs: OGTT fasting ***, 1 hour ***, 2 hour *** on ***, A1c ***% on ***  Dietary and Lifestyle History: ***  Physical Activity: *** Stress: *** Sleep: ***  24 hr Recall:  First Meal:  *** Snack:  *** Second meal:  *** Snack:  *** Third meal:  *** Snack:  *** Beverages:  ***  NUTRITION INTERVENTION  Nutrition education (E-1) on the following topics:   Initial Follow-up  []  []  Definition of Gestational Diabetes []  []  Why dietary management is important in controlling blood glucose []  []  Effects each nutrient has on blood glucose levels []  []  Simple carbohydrates vs complex carbohydrates []  []  Fluid intake []  []  Creating a balanced meal plan []  []  Carbohydrate counting  []  []  When to check blood glucose levels []  []  Proper blood glucose monitoring techniques []  []  Effect of stress and stress reduction techniques  []  []  Exercise effect on blood glucose levels, appropriate exercise during pregnancy []  []  Importance of limiting caffeine and abstaining from alcohol and smoking []  []  Medications used for blood sugar control during pregnancy []  []  Hypoglycemia and rule of 15 []  []  Postpartum self care  Blood glucose monitor given: *** Lot # *** Exp: *** CBG: *** mg/dL  *** Patient has a meter prior to visit. Patient is *** testing pre breakfast and 2 hours after each meal. FBS: *** Postprandial: ***  Patient instructed to monitor glucose levels: FBS: 60 - <= 95 mg/dL; 2 hour: <= 098 mg/dL  Patient received handouts: Nutrition Diabetes and Pregnancy Carbohydrate Counting List Blood glucose log Snack ideas for diabetes during pregnancy  Patient will be seen for follow-up as needed.

## 2024-01-27 ENCOUNTER — Other Ambulatory Visit (HOSPITAL_COMMUNITY): Payer: Self-pay

## 2024-01-28 ENCOUNTER — Other Ambulatory Visit (HOSPITAL_COMMUNITY): Payer: Self-pay

## 2024-01-28 ENCOUNTER — Other Ambulatory Visit: Payer: Self-pay

## 2024-01-28 ENCOUNTER — Ambulatory Visit

## 2024-01-28 DIAGNOSIS — O99810 Abnormal glucose complicating pregnancy: Secondary | ICD-10-CM

## 2024-01-30 ENCOUNTER — Other Ambulatory Visit (HOSPITAL_COMMUNITY): Payer: Self-pay

## 2024-01-31 ENCOUNTER — Other Ambulatory Visit (HOSPITAL_COMMUNITY): Payer: Self-pay

## 2024-02-03 DIAGNOSIS — E039 Hypothyroidism, unspecified: Secondary | ICD-10-CM | POA: Diagnosis not present

## 2024-02-03 DIAGNOSIS — Z369 Encounter for antenatal screening, unspecified: Secondary | ICD-10-CM | POA: Diagnosis not present

## 2024-02-04 ENCOUNTER — Other Ambulatory Visit (HOSPITAL_COMMUNITY): Payer: Self-pay

## 2024-02-04 ENCOUNTER — Encounter: Attending: Obstetrics | Admitting: Dietician

## 2024-02-04 ENCOUNTER — Other Ambulatory Visit: Payer: Self-pay

## 2024-02-04 DIAGNOSIS — O99810 Abnormal glucose complicating pregnancy: Secondary | ICD-10-CM | POA: Insufficient documentation

## 2024-02-04 MED ORDER — NICOTINE 14 MG/24HR TD PT24
MEDICATED_PATCH | TRANSDERMAL | 1 refills | Status: AC
  Filled 2024-02-04: qty 14, 14d supply, fill #0
  Filled 2024-03-23: qty 14, 14d supply, fill #1

## 2024-02-04 NOTE — Progress Notes (Signed)
 Patient was seen on 02/04/2024 for Gestational Diabetes self-management class at the [facility]. The following learning objectives were met by the patient during this course:  States the definition of Gestational Diabetes States why dietary management is important in controlling blood glucose Describes the effects each nutrient has on blood glucose levels Demonstrates ability to create a balanced meal plan Demonstrates carbohydrate counting  States when to check blood glucose levels Demonstrates proper blood glucose monitoring techniques States the effect of stress and exercise on blood glucose levels States the importance of limiting caffeine and abstaining from alcohol and smoking    Patient has a meter prior to visit. Patient is instructed to test pre breakfast and 2 hours after each meal. Blood glucose today in class 97 mg/dL, reported as fasting per Pt  Patient instructed to monitor glucose levels:  QID FBS: 60 - <95 1 hour: <140 2 hour: <120  *Patient received handouts: QID Nutrition Diabetes and Pregnancy Carbohydrate Counting List Blood glucose log Snack ideas for diabetes during pregnancy Plate Planner No questionnaires on file.

## 2024-02-05 ENCOUNTER — Other Ambulatory Visit (HOSPITAL_COMMUNITY): Payer: Self-pay

## 2024-02-05 ENCOUNTER — Other Ambulatory Visit: Payer: Self-pay

## 2024-02-05 DIAGNOSIS — F411 Generalized anxiety disorder: Secondary | ICD-10-CM | POA: Diagnosis not present

## 2024-02-05 DIAGNOSIS — Z331 Pregnant state, incidental: Secondary | ICD-10-CM | POA: Diagnosis not present

## 2024-02-05 DIAGNOSIS — F902 Attention-deficit hyperactivity disorder, combined type: Secondary | ICD-10-CM | POA: Diagnosis not present

## 2024-02-05 DIAGNOSIS — F331 Major depressive disorder, recurrent, moderate: Secondary | ICD-10-CM | POA: Diagnosis not present

## 2024-02-05 DIAGNOSIS — G4700 Insomnia, unspecified: Secondary | ICD-10-CM | POA: Diagnosis not present

## 2024-02-05 MED ORDER — LISDEXAMFETAMINE DIMESYLATE 40 MG PO CAPS
40.0000 mg | ORAL_CAPSULE | Freq: Every day | ORAL | 0 refills | Status: DC
  Filled 2024-02-05: qty 30, 30d supply, fill #0

## 2024-02-05 MED ORDER — DEXCOM G7 SENSOR MISC
1.0000 | 6 refills | Status: DC
  Filled 2024-02-05: qty 1, 10d supply, fill #0
  Filled 2024-02-18: qty 3, 30d supply, fill #0
  Filled 2024-02-18 – 2024-02-19 (×2): qty 1, 10d supply, fill #0
  Filled 2024-03-23: qty 1, 10d supply, fill #1

## 2024-02-06 ENCOUNTER — Encounter: Payer: Self-pay | Admitting: *Deleted

## 2024-02-06 ENCOUNTER — Other Ambulatory Visit: Payer: Self-pay

## 2024-02-06 DIAGNOSIS — Z8632 Personal history of gestational diabetes: Secondary | ICD-10-CM | POA: Insufficient documentation

## 2024-02-06 DIAGNOSIS — O24419 Gestational diabetes mellitus in pregnancy, unspecified control: Secondary | ICD-10-CM | POA: Insufficient documentation

## 2024-02-13 ENCOUNTER — Other Ambulatory Visit (HOSPITAL_COMMUNITY): Payer: Self-pay

## 2024-02-17 DIAGNOSIS — Z369 Encounter for antenatal screening, unspecified: Secondary | ICD-10-CM | POA: Diagnosis not present

## 2024-02-18 ENCOUNTER — Other Ambulatory Visit (HOSPITAL_COMMUNITY): Payer: Self-pay

## 2024-02-18 ENCOUNTER — Encounter: Payer: Self-pay | Admitting: Pharmacist

## 2024-02-18 ENCOUNTER — Other Ambulatory Visit: Payer: Self-pay

## 2024-02-18 ENCOUNTER — Ambulatory Visit: Attending: Neurology

## 2024-02-18 ENCOUNTER — Ambulatory Visit (HOSPITAL_BASED_OUTPATIENT_CLINIC_OR_DEPARTMENT_OTHER): Admitting: Obstetrics and Gynecology

## 2024-02-18 ENCOUNTER — Encounter (HOSPITAL_COMMUNITY): Payer: Self-pay

## 2024-02-18 VITALS — BP 126/74 | HR 115

## 2024-02-18 DIAGNOSIS — O24414 Gestational diabetes mellitus in pregnancy, insulin controlled: Secondary | ICD-10-CM | POA: Insufficient documentation

## 2024-02-18 DIAGNOSIS — E669 Obesity, unspecified: Secondary | ICD-10-CM | POA: Diagnosis not present

## 2024-02-18 DIAGNOSIS — Z3A32 32 weeks gestation of pregnancy: Secondary | ICD-10-CM | POA: Diagnosis not present

## 2024-02-18 DIAGNOSIS — O09523 Supervision of elderly multigravida, third trimester: Secondary | ICD-10-CM | POA: Diagnosis not present

## 2024-02-18 DIAGNOSIS — O99213 Obesity complicating pregnancy, third trimester: Secondary | ICD-10-CM | POA: Diagnosis not present

## 2024-02-18 DIAGNOSIS — O24419 Gestational diabetes mellitus in pregnancy, unspecified control: Secondary | ICD-10-CM | POA: Insufficient documentation

## 2024-02-18 DIAGNOSIS — O3663X0 Maternal care for excessive fetal growth, third trimester, not applicable or unspecified: Secondary | ICD-10-CM | POA: Diagnosis not present

## 2024-02-18 MED ORDER — ABRYSVO 120 MCG/0.5ML IM SOLR
0.5000 mL | Freq: Once | INTRAMUSCULAR | 0 refills | Status: AC
  Filled 2024-02-18: qty 0.5, 1d supply, fill #0

## 2024-02-18 MED ORDER — HYDROXYZINE PAMOATE 50 MG PO CAPS
50.0000 mg | ORAL_CAPSULE | Freq: Every day | ORAL | 6 refills | Status: DC
  Filled 2024-02-18: qty 30, 30d supply, fill #0
  Filled 2024-03-23: qty 30, 30d supply, fill #1
  Filled 2024-05-04: qty 30, 30d supply, fill #2

## 2024-02-18 MED ORDER — INSULIN GLARGINE-YFGN 100 UNIT/ML ~~LOC~~ SOPN
10.0000 [IU] | PEN_INJECTOR | Freq: Every day | SUBCUTANEOUS | 3 refills | Status: DC
  Filled 2024-02-18: qty 3, 30d supply, fill #0
  Filled 2024-03-12: qty 3, 28d supply, fill #1

## 2024-02-18 NOTE — Progress Notes (Signed)
 Maternal-Fetal Medicine Consultation  Name: Sonia Holden  MRN: [id number]  GA: G2P0010 [redacted]w[redacted]d   Gestational diabetes.  Patient will be starting Lantus  insulin  10 units from tonight.  Her fasting levels around 100 mg/dL and some postprandial levels are in the 140s. Blood pressure today at our office is 126/74 mmHg.  Ultrasound The estimated fetal weight is at the 96th percentile and the abdominal circumference measurement at the 99th percentile.  Amniotic fluid is normal and good fetal activity seen.  Cephalic presentation.  Antenatal testing is reassuring.  BPP 8/8.  Gestational diabetes I discussed the importance of good blood glucose control to prevent adverse outcomes.  Suboptimal control can lead to fetal macrosomia, shoulder dystocia and neurological injuries at birth.  Control of diabetes can be challenging at the third trimester.  I encouraged the patient to start insulin  and if postprandial levels are higher, short acting insulin  (Novolog) may be required.  I counseled the patient on hypoglycemia and corrective measures.  Recommendations - Continue weekly antenatal testing at our office till delivery. - NST to be performed at her prenatal visit at your office.  Consultation including face-to-face (more than 50%) counseling 20 minutes.

## 2024-02-19 ENCOUNTER — Other Ambulatory Visit: Payer: Self-pay

## 2024-02-19 ENCOUNTER — Other Ambulatory Visit (HOSPITAL_COMMUNITY): Payer: Self-pay

## 2024-02-19 DIAGNOSIS — Z79899 Other long term (current) drug therapy: Secondary | ICD-10-CM | POA: Diagnosis not present

## 2024-02-19 DIAGNOSIS — H52223 Regular astigmatism, bilateral: Secondary | ICD-10-CM | POA: Diagnosis not present

## 2024-02-19 DIAGNOSIS — H5213 Myopia, bilateral: Secondary | ICD-10-CM | POA: Diagnosis not present

## 2024-02-19 LAB — OPHTHALMOLOGY REPORT-SCANNED

## 2024-02-20 ENCOUNTER — Other Ambulatory Visit (HOSPITAL_COMMUNITY): Payer: Self-pay

## 2024-02-25 ENCOUNTER — Ambulatory Visit (HOSPITAL_BASED_OUTPATIENT_CLINIC_OR_DEPARTMENT_OTHER): Admitting: Obstetrics and Gynecology

## 2024-02-25 ENCOUNTER — Ambulatory Visit: Attending: Obstetrics and Gynecology

## 2024-02-25 VITALS — BP 132/79 | HR 112

## 2024-02-25 DIAGNOSIS — E669 Obesity, unspecified: Secondary | ICD-10-CM | POA: Diagnosis not present

## 2024-02-25 DIAGNOSIS — O09523 Supervision of elderly multigravida, third trimester: Secondary | ICD-10-CM | POA: Insufficient documentation

## 2024-02-25 DIAGNOSIS — O99213 Obesity complicating pregnancy, third trimester: Secondary | ICD-10-CM | POA: Diagnosis not present

## 2024-02-25 DIAGNOSIS — M069 Rheumatoid arthritis, unspecified: Secondary | ICD-10-CM

## 2024-02-25 DIAGNOSIS — O24414 Gestational diabetes mellitus in pregnancy, insulin controlled: Secondary | ICD-10-CM

## 2024-02-25 DIAGNOSIS — O09891 Supervision of other high risk pregnancies, first trimester: Secondary | ICD-10-CM

## 2024-02-25 DIAGNOSIS — Z3A33 33 weeks gestation of pregnancy: Secondary | ICD-10-CM | POA: Diagnosis not present

## 2024-02-25 DIAGNOSIS — O3663X0 Maternal care for excessive fetal growth, third trimester, not applicable or unspecified: Secondary | ICD-10-CM | POA: Insufficient documentation

## 2024-02-25 DIAGNOSIS — O99210 Obesity complicating pregnancy, unspecified trimester: Secondary | ICD-10-CM | POA: Insufficient documentation

## 2024-02-25 DIAGNOSIS — O24419 Gestational diabetes mellitus in pregnancy, unspecified control: Secondary | ICD-10-CM | POA: Insufficient documentation

## 2024-02-25 NOTE — Progress Notes (Signed)
 Maternal-Fetal Medicine Consultation  Name: Sonia Holden  MRN: [id number]  GA: G2P0010 [redacted]w[redacted]d   Gestational diabetes.  Patient started taking glargine insulin  10 units from yesterday.  She reports her fasting level was 88 mg/dL this morning and the postprandial levels range between 100 and 110 mg/dL.  Today, her postprandial level was 93 mg/dL.  Blood pressure today at our office is 132/79 mmHg. Ultrasound Amniotic fluid normal good fetal activity seen.  Cephalic presentation.  Antenatal testing is reassuring.  BPP 8/8. I reassured the patient of the findings.  We discussed normal glucose parameters and I encouraged her to continue insulin  that may have to be gradually increased.  Postprandial levels are not within normal range, short acting insulin  may be required. Timing of delivery: If diabetes is suboptimally controlled, delivery at 37 or [redacted] weeks gestation is reasonable.  Recommendations - Continue weekly antenatal testing till delivery.     Consultation including face-to-face (more than 50%) counseling 10 minutes.

## 2024-03-02 ENCOUNTER — Other Ambulatory Visit (HOSPITAL_COMMUNITY): Payer: Self-pay

## 2024-03-02 ENCOUNTER — Other Ambulatory Visit: Payer: Self-pay

## 2024-03-02 DIAGNOSIS — Z23 Encounter for immunization: Secondary | ICD-10-CM | POA: Diagnosis not present

## 2024-03-02 MED ORDER — DEXCOM G7 SENSOR MISC
0 refills | Status: DC
  Filled 2024-03-02: qty 1, 10d supply, fill #0

## 2024-03-03 ENCOUNTER — Other Ambulatory Visit: Payer: Self-pay

## 2024-03-03 ENCOUNTER — Ambulatory Visit (HOSPITAL_BASED_OUTPATIENT_CLINIC_OR_DEPARTMENT_OTHER): Admitting: Obstetrics and Gynecology

## 2024-03-03 ENCOUNTER — Ambulatory Visit: Attending: Obstetrics and Gynecology

## 2024-03-03 ENCOUNTER — Other Ambulatory Visit (HOSPITAL_COMMUNITY): Payer: Self-pay

## 2024-03-03 VITALS — BP 122/79 | HR 114

## 2024-03-03 DIAGNOSIS — O09523 Supervision of elderly multigravida, third trimester: Secondary | ICD-10-CM | POA: Insufficient documentation

## 2024-03-03 DIAGNOSIS — O99213 Obesity complicating pregnancy, third trimester: Secondary | ICD-10-CM | POA: Diagnosis not present

## 2024-03-03 DIAGNOSIS — O99210 Obesity complicating pregnancy, unspecified trimester: Secondary | ICD-10-CM | POA: Diagnosis not present

## 2024-03-03 DIAGNOSIS — O09891 Supervision of other high risk pregnancies, first trimester: Secondary | ICD-10-CM | POA: Insufficient documentation

## 2024-03-03 DIAGNOSIS — O24414 Gestational diabetes mellitus in pregnancy, insulin controlled: Secondary | ICD-10-CM | POA: Insufficient documentation

## 2024-03-03 DIAGNOSIS — O3663X0 Maternal care for excessive fetal growth, third trimester, not applicable or unspecified: Secondary | ICD-10-CM | POA: Diagnosis not present

## 2024-03-03 DIAGNOSIS — E669 Obesity, unspecified: Secondary | ICD-10-CM

## 2024-03-03 DIAGNOSIS — O24419 Gestational diabetes mellitus in pregnancy, unspecified control: Secondary | ICD-10-CM | POA: Diagnosis present

## 2024-03-03 DIAGNOSIS — M069 Rheumatoid arthritis, unspecified: Secondary | ICD-10-CM | POA: Insufficient documentation

## 2024-03-03 DIAGNOSIS — Z3A34 34 weeks gestation of pregnancy: Secondary | ICD-10-CM | POA: Insufficient documentation

## 2024-03-03 NOTE — Progress Notes (Signed)
 After review, MFM consult with provider is not indicated for today  Arna Ranks, MD 03/03/2024 2:55 PM  [facility]

## 2024-03-04 ENCOUNTER — Other Ambulatory Visit (HOSPITAL_COMMUNITY): Payer: Self-pay

## 2024-03-04 MED ORDER — DEXCOM G7 SENSOR MISC
3 refills | Status: DC
  Filled 2024-03-04 – 2024-03-12 (×2): qty 1, 10d supply, fill #0

## 2024-03-04 MED ORDER — ABRYSVO 120 MCG/0.5ML IM SOLR
0.5000 mL | INTRAMUSCULAR | 0 refills | Status: DC
  Filled 2024-03-04: qty 0.5, 1d supply, fill #0

## 2024-03-05 ENCOUNTER — Other Ambulatory Visit: Payer: Self-pay

## 2024-03-05 ENCOUNTER — Other Ambulatory Visit (HOSPITAL_COMMUNITY): Payer: Self-pay

## 2024-03-05 DIAGNOSIS — Z3A35 35 weeks gestation of pregnancy: Secondary | ICD-10-CM | POA: Diagnosis not present

## 2024-03-05 DIAGNOSIS — O24414 Gestational diabetes mellitus in pregnancy, insulin controlled: Secondary | ICD-10-CM | POA: Diagnosis not present

## 2024-03-05 MED ORDER — DEXCOM G7 RECEIVER DEVI
0 refills | Status: DC
  Filled 2024-03-05 – 2024-03-12 (×2): qty 1, 90d supply, fill #0

## 2024-03-06 ENCOUNTER — Other Ambulatory Visit (HOSPITAL_COMMUNITY): Payer: Self-pay

## 2024-03-09 ENCOUNTER — Other Ambulatory Visit: Payer: Self-pay

## 2024-03-10 ENCOUNTER — Ambulatory Visit (HOSPITAL_BASED_OUTPATIENT_CLINIC_OR_DEPARTMENT_OTHER): Admitting: Obstetrics

## 2024-03-10 ENCOUNTER — Other Ambulatory Visit (HOSPITAL_COMMUNITY): Payer: Self-pay

## 2024-03-10 ENCOUNTER — Ambulatory Visit: Attending: Obstetrics and Gynecology

## 2024-03-10 ENCOUNTER — Other Ambulatory Visit: Payer: Self-pay

## 2024-03-10 VITALS — BP 135/86 | HR 116

## 2024-03-10 DIAGNOSIS — O99213 Obesity complicating pregnancy, third trimester: Secondary | ICD-10-CM

## 2024-03-10 DIAGNOSIS — E669 Obesity, unspecified: Secondary | ICD-10-CM | POA: Diagnosis not present

## 2024-03-10 DIAGNOSIS — F331 Major depressive disorder, recurrent, moderate: Secondary | ICD-10-CM | POA: Diagnosis not present

## 2024-03-10 DIAGNOSIS — O24419 Gestational diabetes mellitus in pregnancy, unspecified control: Secondary | ICD-10-CM | POA: Insufficient documentation

## 2024-03-10 DIAGNOSIS — O24414 Gestational diabetes mellitus in pregnancy, insulin controlled: Secondary | ICD-10-CM

## 2024-03-10 DIAGNOSIS — Z3A35 35 weeks gestation of pregnancy: Secondary | ICD-10-CM

## 2024-03-10 DIAGNOSIS — G4700 Insomnia, unspecified: Secondary | ICD-10-CM | POA: Diagnosis not present

## 2024-03-10 DIAGNOSIS — F411 Generalized anxiety disorder: Secondary | ICD-10-CM | POA: Diagnosis not present

## 2024-03-10 DIAGNOSIS — O09523 Supervision of elderly multigravida, third trimester: Secondary | ICD-10-CM | POA: Insufficient documentation

## 2024-03-10 DIAGNOSIS — O3663X0 Maternal care for excessive fetal growth, third trimester, not applicable or unspecified: Secondary | ICD-10-CM | POA: Diagnosis not present

## 2024-03-10 DIAGNOSIS — F902 Attention-deficit hyperactivity disorder, combined type: Secondary | ICD-10-CM | POA: Diagnosis not present

## 2024-03-10 DIAGNOSIS — Z331 Pregnant state, incidental: Secondary | ICD-10-CM | POA: Diagnosis not present

## 2024-03-10 MED ORDER — LISDEXAMFETAMINE DIMESYLATE 40 MG PO CAPS
40.0000 mg | ORAL_CAPSULE | Freq: Every day | ORAL | 0 refills | Status: DC
  Filled 2024-03-10: qty 30, 30d supply, fill #0

## 2024-03-10 NOTE — Progress Notes (Signed)
 MFM Consult Note  Sonia Holden is currently at 35 weeks and 6 days.  She has been followed due to advanced maternal age, gestational diabetes treated with insulin , and maternal obesity.    She denies any problems since her last exam.  She reports that her fingersticks were elevated this week because of the food she ate at her baby shower.    A biophysical profile performed today was 8/8.   The AFI was 19.07 cm.   Vertex presentation.  The patient was encouraged to continue to monitor her fingersticks daily and to continue using insulin  for glycemic control as prescribed.    Due to her underlying medical conditions, she already has an induction of labor scheduled on April 02, 2024.    She will return in 1 week for another BPP and growth scan.  She should have an NST performed in your office following her ultrasound in our office next week.    The patient stated that all of her questions were answered today.  A total of 10 minutes was spent counseling and coordinating the care for this patient.  Greater than 50% of the time was spent in direct face-to-face contact.

## 2024-03-12 ENCOUNTER — Other Ambulatory Visit (HOSPITAL_COMMUNITY): Payer: Self-pay

## 2024-03-12 DIAGNOSIS — Z3A36 36 weeks gestation of pregnancy: Secondary | ICD-10-CM | POA: Diagnosis not present

## 2024-03-12 DIAGNOSIS — O24419 Gestational diabetes mellitus in pregnancy, unspecified control: Secondary | ICD-10-CM | POA: Diagnosis not present

## 2024-03-12 LAB — OB RESULTS CONSOLE GBS: GBS: NEGATIVE

## 2024-03-12 MED ORDER — INSULIN GLARGINE-YFGN 100 UNIT/ML ~~LOC~~ SOPN
14.0000 [IU] | PEN_INJECTOR | Freq: Every day | SUBCUTANEOUS | 3 refills | Status: DC
  Filled 2024-03-12: qty 3, 21d supply, fill #0

## 2024-03-15 ENCOUNTER — Other Ambulatory Visit: Payer: Self-pay

## 2024-03-16 DIAGNOSIS — Z3A36 36 weeks gestation of pregnancy: Secondary | ICD-10-CM | POA: Diagnosis not present

## 2024-03-16 DIAGNOSIS — O24414 Gestational diabetes mellitus in pregnancy, insulin controlled: Secondary | ICD-10-CM | POA: Diagnosis not present

## 2024-03-17 ENCOUNTER — Ambulatory Visit: Admitting: Maternal & Fetal Medicine

## 2024-03-17 ENCOUNTER — Ambulatory Visit: Attending: Obstetrics and Gynecology

## 2024-03-17 VITALS — BP 126/84

## 2024-03-17 DIAGNOSIS — O24419 Gestational diabetes mellitus in pregnancy, unspecified control: Secondary | ICD-10-CM | POA: Diagnosis not present

## 2024-03-17 DIAGNOSIS — O24414 Gestational diabetes mellitus in pregnancy, insulin controlled: Secondary | ICD-10-CM

## 2024-03-17 DIAGNOSIS — Z3A36 36 weeks gestation of pregnancy: Secondary | ICD-10-CM

## 2024-03-17 DIAGNOSIS — O99213 Obesity complicating pregnancy, third trimester: Secondary | ICD-10-CM | POA: Diagnosis not present

## 2024-03-17 DIAGNOSIS — O3663X0 Maternal care for excessive fetal growth, third trimester, not applicable or unspecified: Secondary | ICD-10-CM | POA: Insufficient documentation

## 2024-03-17 DIAGNOSIS — E669 Obesity, unspecified: Secondary | ICD-10-CM

## 2024-03-17 DIAGNOSIS — O09523 Supervision of elderly multigravida, third trimester: Secondary | ICD-10-CM | POA: Insufficient documentation

## 2024-03-17 DIAGNOSIS — Z3483 Encounter for supervision of other normal pregnancy, third trimester: Secondary | ICD-10-CM

## 2024-03-17 NOTE — Progress Notes (Signed)
 After review, MFM consult with provider is not indicated for today  [name], DO 03/17/2024 3:00 PM  [facility]

## 2024-03-24 ENCOUNTER — Other Ambulatory Visit: Payer: Self-pay

## 2024-03-24 ENCOUNTER — Other Ambulatory Visit (HOSPITAL_COMMUNITY): Payer: Self-pay

## 2024-03-26 ENCOUNTER — Encounter (HOSPITAL_COMMUNITY): Payer: Self-pay

## 2024-03-26 DIAGNOSIS — Z3A38 38 weeks gestation of pregnancy: Secondary | ICD-10-CM | POA: Diagnosis not present

## 2024-03-26 DIAGNOSIS — O24414 Gestational diabetes mellitus in pregnancy, insulin controlled: Secondary | ICD-10-CM | POA: Diagnosis not present

## 2024-03-26 NOTE — Patient Instructions (Addendum)
 Sonia Holden  03/26/2024   Your procedure is scheduled on:  04/01/2024  Arrive at 0530 at Entrance C on [address] at [facility]  and [employer]. You are invited to use the FREE valet parking or use the Visitor's parking deck.  Pick up the phone at the desk and dial [phone].  Call this number if you have problems the morning of surgery: [phone]  Remember:   Do not eat food:(After Midnight) Desps de medianoche.  You may drink clear liquids until  __0330___.  Clear liquids means a liquid you can see thru.  It can have color such as Cola or Kool aid.  Tea is OK and coffee as long as no milk or creamer of any kind.  Take these medicines the morning of surgery with A SIP OF WATER:  Take levothyroxine  as prescribed.  The night before surgery take half of the prescribed insulin  at bedtime.  Do not take any insulin  on day of surgery.  Take prozac  as prescribed.  Take hydroxycholoroquine as prescribed   Do not wear jewelry, make-up or nail polish.  Do not wear lotions, powders, or perfumes. Do not wear deodorant.  Do not shave 48 hours prior to surgery.  Do not bring valuables to the hospital.  [facility] is not   responsible for any belongings or valuables brought to the hospital.  Contacts, dentures or bridgework may not be worn into surgery.  Leave suitcase in the car. After surgery it may be brought to your room.  For patients admitted to the hospital, checkout time is 11:00 AM the day of              discharge.      Please read over the following fact sheets that you were given:     Preparing for Surgery

## 2024-03-30 ENCOUNTER — Encounter (HOSPITAL_COMMUNITY)
Admission: RE | Admit: 2024-03-30 | Discharge: 2024-03-30 | Disposition: A | Discharge status: Home / Self Care | Source: Ambulatory Visit | Attending: Obstetrics | Admitting: Obstetrics

## 2024-03-30 DIAGNOSIS — Z01812 Encounter for preprocedural laboratory examination: Secondary | ICD-10-CM | POA: Insufficient documentation

## 2024-03-30 HISTORY — DX: Family history of other specified conditions: Z84.89

## 2024-03-30 HISTORY — DX: Gestational diabetes mellitus in pregnancy, unspecified control: O24.419

## 2024-03-30 LAB — CBC
HCT: 38.8 % (ref 36.0–46.0)
Hemoglobin: 12.8 g/dL (ref 12.0–15.0)
MCH: 26.8 pg (ref 26.0–34.0)
MCHC: 33 g/dL (ref 30.0–36.0)
MCV: 81.3 fL (ref 80.0–100.0)
Platelets: 275 K/uL (ref 150–400)
RBC: 4.77 MIL/uL (ref 3.87–5.11)
RDW: 14.4 % (ref 11.5–15.5)
WBC: 11 K/uL — ABNORMAL HIGH (ref 4.0–10.5)
nRBC: 0 % (ref 0.0–0.2)

## 2024-03-30 LAB — TYPE AND SCREEN
ABO/RH(D): O POS
Antibody Screen: NEGATIVE

## 2024-03-31 DIAGNOSIS — Z3A38 38 weeks gestation of pregnancy: Secondary | ICD-10-CM | POA: Diagnosis not present

## 2024-03-31 DIAGNOSIS — O24415 Gestational diabetes mellitus in pregnancy, controlled by oral hypoglycemic drugs: Secondary | ICD-10-CM | POA: Diagnosis not present

## 2024-03-31 DIAGNOSIS — O288 Other abnormal findings on antenatal screening of mother: Secondary | ICD-10-CM | POA: Diagnosis not present

## 2024-03-31 LAB — RPR: RPR Ser Ql: NONREACTIVE

## 2024-03-31 NOTE — Anesthesia Preprocedure Evaluation (Signed)
 Anesthesia Evaluation  Patient identified by MRN, date of birth, ID band Patient awake    Reviewed: Allergy & Precautions, NPO status , Patient's Chart, lab work & pertinent test results  History of Anesthesia Complications (+) PONV and history of anesthetic complications  Airway Mallampati: II  TM Distance: >3 FB Neck ROM: Full    Dental  (+) Teeth Intact, Dental Advisory Given   Pulmonary Current SmokerPatient did not abstain from smoking.   Pulmonary exam normal breath sounds clear to auscultation       Cardiovascular negative cardio ROS Normal cardiovascular exam Rhythm:Regular Rate:Normal     Neuro/Psych  PSYCHIATRIC DISORDERS Anxiety Depression    negative neurological ROS     GI/Hepatic Neg liver ROS,GERD  Medicated,,  Endo/Other  diabetes, Gestational, Insulin  DependentHypothyroidism  Class 3 obesity  Renal/GU negative Renal ROS     Musculoskeletal  (+) Arthritis , Rheumatoid disorders,    Abdominal   Peds  (+) ADHD Hematology negative hematology ROS (+) Plt 275k   Anesthesia Other Findings   Reproductive/Obstetrics (+) Pregnancy                              Anesthesia Physical Anesthesia Plan  ASA: 3  Anesthesia Plan: Spinal   Post-op Pain Management:    Induction:   PONV Risk Score and Plan: 2 and Scopolamine patch - Pre-op, Dexamethasone  and Ondansetron   Airway Management Planned: Natural Airway  Additional Equipment:   Intra-op Plan:   Post-operative Plan:   Informed Consent: I have reviewed the patients History and Physical, chart, labs and discussed the procedure including the risks, benefits and alternatives for the proposed anesthesia with the patient or authorized representative who has indicated his/her understanding and acceptance.     Dental advisory given  Plan Discussed with: CRNA  Anesthesia Plan Comments:          Anesthesia Quick  Evaluation

## 2024-04-01 ENCOUNTER — Inpatient Hospital Stay (HOSPITAL_COMMUNITY)
Admission: RE | Admit: 2024-04-01 | Discharge: 2024-04-05 | DRG: 785 | Disposition: A | Discharge status: Home / Self Care | Attending: Obstetrics and Gynecology | Admitting: Obstetrics and Gynecology

## 2024-04-01 ENCOUNTER — Other Ambulatory Visit: Payer: Self-pay

## 2024-04-01 ENCOUNTER — Inpatient Hospital Stay (HOSPITAL_COMMUNITY): Payer: Self-pay | Admitting: Anesthesiology

## 2024-04-01 ENCOUNTER — Encounter (HOSPITAL_COMMUNITY)
Admission: RE | Disposition: A | Discharge status: Home / Self Care | Payer: Self-pay | Source: Home / Self Care | Attending: Obstetrics and Gynecology

## 2024-04-01 ENCOUNTER — Encounter (HOSPITAL_COMMUNITY): Payer: Self-pay | Admitting: Obstetrics and Gynecology

## 2024-04-01 DIAGNOSIS — D270 Benign neoplasm of right ovary: Secondary | ICD-10-CM | POA: Diagnosis not present

## 2024-04-01 DIAGNOSIS — Z8249 Family history of ischemic heart disease and other diseases of the circulatory system: Secondary | ICD-10-CM | POA: Diagnosis not present

## 2024-04-01 DIAGNOSIS — M069 Rheumatoid arthritis, unspecified: Secondary | ICD-10-CM | POA: Diagnosis not present

## 2024-04-01 DIAGNOSIS — O134 Gestational [pregnancy-induced] hypertension without significant proteinuria, complicating childbirth: Secondary | ICD-10-CM | POA: Diagnosis not present

## 2024-04-01 DIAGNOSIS — O99214 Obesity complicating childbirth: Secondary | ICD-10-CM | POA: Diagnosis present

## 2024-04-01 DIAGNOSIS — K219 Gastro-esophageal reflux disease without esophagitis: Secondary | ICD-10-CM | POA: Diagnosis present

## 2024-04-01 DIAGNOSIS — O99334 Smoking (tobacco) complicating childbirth: Secondary | ICD-10-CM | POA: Diagnosis present

## 2024-04-01 DIAGNOSIS — O99284 Endocrine, nutritional and metabolic diseases complicating childbirth: Secondary | ICD-10-CM | POA: Diagnosis present

## 2024-04-01 DIAGNOSIS — E063 Autoimmune thyroiditis: Secondary | ICD-10-CM | POA: Diagnosis not present

## 2024-04-01 DIAGNOSIS — O135 Gestational [pregnancy-induced] hypertension without significant proteinuria, complicating the puerperium: Secondary | ICD-10-CM | POA: Diagnosis present

## 2024-04-01 DIAGNOSIS — Z881 Allergy status to other antibiotic agents status: Secondary | ICD-10-CM | POA: Diagnosis not present

## 2024-04-01 DIAGNOSIS — O24429 Gestational diabetes mellitus in childbirth, unspecified control: Secondary | ICD-10-CM | POA: Diagnosis not present

## 2024-04-01 DIAGNOSIS — O3663X0 Maternal care for excessive fetal growth, third trimester, not applicable or unspecified: Secondary | ICD-10-CM | POA: Diagnosis present

## 2024-04-01 DIAGNOSIS — O9962 Diseases of the digestive system complicating childbirth: Secondary | ICD-10-CM | POA: Diagnosis present

## 2024-04-01 DIAGNOSIS — Z79899 Other long term (current) drug therapy: Secondary | ICD-10-CM | POA: Diagnosis not present

## 2024-04-01 DIAGNOSIS — Z3A39 39 weeks gestation of pregnancy: Secondary | ICD-10-CM | POA: Diagnosis not present

## 2024-04-01 DIAGNOSIS — O09523 Supervision of elderly multigravida, third trimester: Secondary | ICD-10-CM | POA: Diagnosis not present

## 2024-04-01 DIAGNOSIS — Z302 Encounter for sterilization: Secondary | ICD-10-CM

## 2024-04-01 DIAGNOSIS — O26893 Other specified pregnancy related conditions, third trimester: Secondary | ICD-10-CM | POA: Diagnosis not present

## 2024-04-01 DIAGNOSIS — G588 Other specified mononeuropathies: Secondary | ICD-10-CM | POA: Diagnosis present

## 2024-04-01 DIAGNOSIS — Z9889 Other specified postprocedural states: Principal | ICD-10-CM

## 2024-04-01 DIAGNOSIS — F32A Depression, unspecified: Secondary | ICD-10-CM | POA: Diagnosis present

## 2024-04-01 DIAGNOSIS — O24424 Gestational diabetes mellitus in childbirth, insulin controlled: Secondary | ICD-10-CM | POA: Diagnosis not present

## 2024-04-01 DIAGNOSIS — O99344 Other mental disorders complicating childbirth: Secondary | ICD-10-CM | POA: Diagnosis present

## 2024-04-01 DIAGNOSIS — E66813 Obesity, class 3: Secondary | ICD-10-CM | POA: Diagnosis present

## 2024-04-01 DIAGNOSIS — F17210 Nicotine dependence, cigarettes, uncomplicated: Secondary | ICD-10-CM | POA: Diagnosis present

## 2024-04-01 DIAGNOSIS — F909 Attention-deficit hyperactivity disorder, unspecified type: Secondary | ICD-10-CM | POA: Diagnosis present

## 2024-04-01 DIAGNOSIS — O99892 Other specified diseases and conditions complicating childbirth: Secondary | ICD-10-CM | POA: Diagnosis present

## 2024-04-01 HISTORY — PX: OVARIAN CYST REMOVAL: SHX89

## 2024-04-01 LAB — CBC
HCT: 38.1 % (ref 36.0–46.0)
Hemoglobin: 12.6 g/dL (ref 12.0–15.0)
MCH: 26.9 pg (ref 26.0–34.0)
MCHC: 33.1 g/dL (ref 30.0–36.0)
MCV: 81.2 fL (ref 80.0–100.0)
Platelets: 245 K/uL (ref 150–400)
RBC: 4.69 MIL/uL (ref 3.87–5.11)
RDW: 14.5 % (ref 11.5–15.5)
WBC: 23.4 K/uL — ABNORMAL HIGH (ref 4.0–10.5)
nRBC: 0 % (ref 0.0–0.2)

## 2024-04-01 LAB — CREATININE, SERUM
Creatinine, Ser: 0.62 mg/dL (ref 0.44–1.00)
GFR, Estimated: >60 mL/min (ref >60)

## 2024-04-01 LAB — ABO/RH: ABO/RH(D): O POS

## 2024-04-01 LAB — GLUCOSE, CAPILLARY
Glucose-Capillary: 82 mg/dL (ref 70–99)
Glucose-Capillary: 73 mg/dL (ref 70–99)

## 2024-04-01 SURGERY — Surgical Case
Anesthesia: Spinal | Site: Abdomen | Laterality: Right

## 2024-04-01 MED ORDER — HYDROXYCHLOROQUINE SULFATE 200 MG PO TABS
100.0000 mg | ORAL_TABLET | Freq: Every day | ORAL | Status: DC
Start: 2024-04-01 — End: 2024-04-05
  Administered 2024-04-02 – 2024-04-05 (×4): 100 mg via ORAL
  Filled 2024-04-01 (×6): qty 0.5

## 2024-04-01 MED ORDER — DEXAMETHASONE SOD PHOSPHATE PF 10 MG/ML IJ SOLN
INTRAMUSCULAR | Status: DC | PRN
  Administered 2024-04-01: 10 mg via INTRAVENOUS

## 2024-04-01 MED ORDER — GENTAMICIN SULFATE 40 MG/ML IJ SOLN
5.0000 mg/kg | INTRAVENOUS | Status: AC
  Administered 2024-04-01: 432 mg via INTRAVENOUS
  Filled 2024-04-01: qty 10.75

## 2024-04-01 MED ORDER — IBUPROFEN 600 MG PO TABS
600.0000 mg | ORAL_TABLET | Freq: Four times a day (QID) | ORAL | Status: DC
  Administered 2024-04-02 – 2024-04-05 (×12): 600 mg via ORAL
  Filled 2024-04-01 (×12): qty 1

## 2024-04-01 MED ORDER — PRENATAL MULTIVITAMIN CH
1.0000 | ORAL_TABLET | Freq: Every day | ORAL | Status: DC
  Administered 2024-04-02 – 2024-04-05 (×4): 1 via ORAL
  Filled 2024-04-01 (×4): qty 1

## 2024-04-01 MED ORDER — GABAPENTIN 100 MG PO CAPS
200.0000 mg | ORAL_CAPSULE | Freq: Three times a day (TID) | ORAL | Status: DC
  Administered 2024-04-01 – 2024-04-05 (×13): 200 mg via ORAL
  Filled 2024-04-01 (×13): qty 2

## 2024-04-01 MED ORDER — NICOTINE 14 MG/24HR TD PT24
14.0000 mg | MEDICATED_PATCH | Freq: Every day | TRANSDERMAL | Status: DC | PRN
Start: 2024-04-01 — End: 2024-04-05

## 2024-04-01 MED ORDER — DIPHENHYDRAMINE HCL 25 MG PO CAPS: 25.0000 mg | ORAL_CAPSULE | Freq: Four times a day (QID) | ORAL | Status: DC | PRN

## 2024-04-01 MED ORDER — CHLORHEXIDINE GLUCONATE 0.12 % MT SOLN
15.0000 mL | Freq: Once | OROMUCOSAL | Status: AC
  Administered 2024-04-01: 15 mL via OROMUCOSAL

## 2024-04-01 MED ORDER — NALOXONE HCL 4 MG/10ML IJ SOLN: 1.0000 ug/kg/h | INTRAVENOUS | Status: DC | PRN

## 2024-04-01 MED ORDER — SENNOSIDES-DOCUSATE SODIUM 8.6-50 MG PO TABS
2.0000 | ORAL_TABLET | Freq: Every day | ORAL | Status: DC
  Administered 2024-04-02 – 2024-04-05 (×4): 2 via ORAL
  Filled 2024-04-01 (×4): qty 2

## 2024-04-01 MED ORDER — FAMOTIDINE 20 MG PO TABS
20.0000 mg | ORAL_TABLET | Freq: Once | ORAL | Status: AC
  Administered 2024-04-01: 20 mg via ORAL

## 2024-04-01 MED ORDER — AMISULPRIDE (ANTIEMETIC) 5 MG/2ML IV SOLN: 10.0000 mg | Freq: Once | INTRAVENOUS | Status: DC | PRN

## 2024-04-01 MED ORDER — DIPHENHYDRAMINE HCL 25 MG PO CAPS: 25.0000 mg | ORAL_CAPSULE | ORAL | Status: DC | PRN

## 2024-04-01 MED ORDER — FENTANYL CITRATE (PF) 100 MCG/2ML IJ SOLN: 25.0000 ug | INTRAMUSCULAR | Status: DC | PRN

## 2024-04-01 MED ORDER — SCOPOLAMINE 1 MG/3DAYS TD PT72
1.0000 | MEDICATED_PATCH | Freq: Once | TRANSDERMAL | Status: DC
  Administered 2024-04-01: 1 mg via TRANSDERMAL

## 2024-04-01 MED ORDER — SODIUM CHLORIDE 0.9 % IR SOLN
Status: DC | PRN
  Administered 2024-04-01 (×2): 1

## 2024-04-01 MED ORDER — WITCH HAZEL-GLYCERIN EX PADS: 1.0000 | MEDICATED_PAD | CUTANEOUS | Status: DC | PRN

## 2024-04-01 MED ORDER — PHENYLEPHRINE HCL-NACL 20-0.9 MG/250ML-% IV SOLN
INTRAVENOUS | Status: DC | PRN
  Administered 2024-04-01: 60 ug/min via INTRAVENOUS

## 2024-04-01 MED ORDER — MORPHINE SULFATE (PF) 0.5 MG/ML IJ SOLN
INTRAMUSCULAR | Status: AC
  Filled 2024-04-01: qty 10

## 2024-04-01 MED ORDER — SCOPOLAMINE 1 MG/3DAYS TD PT72: 1.0000 | MEDICATED_PATCH | Freq: Once | TRANSDERMAL | Status: DC

## 2024-04-01 MED ORDER — CHLORHEXIDINE GLUCONATE 0.12 % MT SOLN
OROMUCOSAL | Status: AC
  Filled 2024-04-01: qty 15

## 2024-04-01 MED ORDER — NICOTINE POLACRILEX 2 MG MT GUM: 2.0000 mg | CHEWING_GUM | OROMUCOSAL | Status: DC | PRN

## 2024-04-01 MED ORDER — SCOPOLAMINE 1 MG/3DAYS TD PT72
MEDICATED_PATCH | TRANSDERMAL | Status: AC
  Filled 2024-04-01: qty 1

## 2024-04-01 MED ORDER — ENOXAPARIN SODIUM 60 MG/0.6ML IJ SOSY
60.0000 mg | PREFILLED_SYRINGE | INTRAMUSCULAR | Status: DC
  Administered 2024-04-01 – 2024-04-04 (×4): 60 mg via SUBCUTANEOUS
  Filled 2024-04-01 (×4): qty 0.6

## 2024-04-01 MED ORDER — SOD CITRATE-CITRIC ACID 500-334 MG/5ML PO SOLN
ORAL | Status: AC
  Filled 2024-04-01: qty 30

## 2024-04-01 MED ORDER — NALOXONE HCL 0.4 MG/ML IJ SOLN: 0.4000 mg | INTRAMUSCULAR | Status: DC | PRN

## 2024-04-01 MED ORDER — ONDANSETRON HCL 4 MG/2ML IJ SOLN
INTRAMUSCULAR | Status: DC | PRN
  Administered 2024-04-01: 4 mg via INTRAVENOUS

## 2024-04-01 MED ORDER — PHENYLEPHRINE HCL-NACL 20-0.9 MG/250ML-% IV SOLN
INTRAVENOUS | Status: AC
  Filled 2024-04-01: qty 250

## 2024-04-01 MED ORDER — BUPIVACAINE IN DEXTROSE 0.75-8.25 % IT SOLN
INTRATHECAL | Status: DC | PRN
Start: 2024-04-01 — End: 2024-04-01
  Administered 2024-04-01: 1.6 mL via INTRATHECAL

## 2024-04-01 MED ORDER — MENTHOL 3 MG MT LOZG: 1.0000 | LOZENGE | OROMUCOSAL | Status: DC | PRN

## 2024-04-01 MED ORDER — CLINDAMYCIN PHOSPHATE 900 MG/50ML IV SOLN
INTRAVENOUS | Status: AC
  Filled 2024-04-01: qty 50

## 2024-04-01 MED ORDER — OXYTOCIN-SODIUM CHLORIDE 30-0.9 UT/500ML-% IV SOLN
INTRAVENOUS | Status: AC
  Filled 2024-04-01: qty 500

## 2024-04-01 MED ORDER — MORPHINE SULFATE (PF) 0.5 MG/ML IJ SOLN
INTRAMUSCULAR | Status: DC | PRN
  Administered 2024-04-01: 150 ug via INTRATHECAL

## 2024-04-01 MED ORDER — KETOROLAC TROMETHAMINE 30 MG/ML IJ SOLN: 30.0000 mg | Freq: Four times a day (QID) | INTRAMUSCULAR | Status: AC | PRN

## 2024-04-01 MED ORDER — GABAPENTIN 100 MG PO CAPS
100.0000 mg | ORAL_CAPSULE | Freq: Two times a day (BID) | ORAL | Status: DC
Start: 2024-04-01 — End: 2024-04-01
  Filled 2024-04-01: qty 1

## 2024-04-01 MED ORDER — OXYTOCIN-SODIUM CHLORIDE 30-0.9 UT/500ML-% IV SOLN
INTRAVENOUS | Status: DC | PRN
  Administered 2024-04-01: 300 mL via INTRAVENOUS

## 2024-04-01 MED ORDER — DIPHENHYDRAMINE HCL 50 MG/ML IJ SOLN
12.5000 mg | INTRAMUSCULAR | Status: DC | PRN
  Administered 2024-04-01 (×2): 12.5 mg via INTRAVENOUS
  Filled 2024-04-01 (×2): qty 1

## 2024-04-01 MED ORDER — FLUOXETINE HCL 20 MG PO CAPS
40.0000 mg | ORAL_CAPSULE | Freq: Every day | ORAL | Status: DC
  Administered 2024-04-02 – 2024-04-05 (×4): 40 mg via ORAL
  Filled 2024-04-01 (×4): qty 2

## 2024-04-01 MED ORDER — SIMETHICONE 80 MG PO CHEW
80.0000 mg | CHEWABLE_TABLET | Freq: Three times a day (TID) | ORAL | Status: DC
  Administered 2024-04-01 – 2024-04-05 (×12): 80 mg via ORAL
  Filled 2024-04-01 (×13): qty 1

## 2024-04-01 MED ORDER — KETOROLAC TROMETHAMINE 30 MG/ML IJ SOLN
30.0000 mg | Freq: Four times a day (QID) | INTRAMUSCULAR | Status: AC
  Administered 2024-04-01 – 2024-04-02 (×3): 30 mg via INTRAVENOUS
  Filled 2024-04-01 (×3): qty 1

## 2024-04-01 MED ORDER — ACETAMINOPHEN 500 MG PO TABS: 1000.0000 mg | ORAL_TABLET | Freq: Four times a day (QID) | ORAL | Status: DC

## 2024-04-01 MED ORDER — SIMETHICONE 80 MG PO CHEW: 80.0000 mg | CHEWABLE_TABLET | ORAL | Status: DC | PRN

## 2024-04-01 MED ORDER — ONDANSETRON HCL 4 MG/2ML IJ SOLN: 4.0000 mg | Freq: Three times a day (TID) | INTRAMUSCULAR | Status: DC | PRN

## 2024-04-01 MED ORDER — FENTANYL CITRATE (PF) 100 MCG/2ML IJ SOLN
INTRAMUSCULAR | Status: AC
  Filled 2024-04-01: qty 2

## 2024-04-01 MED ORDER — ONDANSETRON HCL 4 MG/2ML IJ SOLN
INTRAMUSCULAR | Status: AC
  Filled 2024-04-01: qty 2

## 2024-04-01 MED ORDER — LACTATED RINGERS IV SOLN: INTRAVENOUS | Status: DC

## 2024-04-01 MED ORDER — SOD CITRATE-CITRIC ACID 500-334 MG/5ML PO SOLN: 30.0000 mL | Freq: Once | ORAL | Status: DC

## 2024-04-01 MED ORDER — LISDEXAMFETAMINE DIMESYLATE 20 MG PO CAPS
40.0000 mg | ORAL_CAPSULE | Freq: Every day | ORAL | Status: DC
  Administered 2024-04-01 – 2024-04-05 (×5): 40 mg via ORAL
  Filled 2024-04-01 (×5): qty 2

## 2024-04-01 MED ORDER — FAMOTIDINE 20 MG PO TABS
ORAL_TABLET | ORAL | Status: AC
Start: 2024-04-01 — End: 2024-04-01
  Filled 2024-04-01: qty 1

## 2024-04-01 MED ORDER — ONDANSETRON HCL 4 MG/2ML IJ SOLN: 4.0000 mg | Freq: Once | INTRAMUSCULAR | Status: DC | PRN

## 2024-04-01 MED ORDER — LEVOTHYROXINE SODIUM 88 MCG PO TABS
88.0000 ug | ORAL_TABLET | Freq: Every day | ORAL | Status: DC
  Administered 2024-04-02 – 2024-04-03 (×2): 88 ug via ORAL
  Filled 2024-04-01 (×2): qty 1

## 2024-04-01 MED ORDER — ORAL CARE MOUTH RINSE: 15.0000 mL | Freq: Once | OROMUCOSAL | Status: AC

## 2024-04-01 MED ORDER — FENTANYL CITRATE (PF) 100 MCG/2ML IJ SOLN
INTRAMUSCULAR | Status: DC | PRN
  Administered 2024-04-01: 15 ug via INTRATHECAL

## 2024-04-01 MED ORDER — COCONUT OIL OIL
1.0000 | TOPICAL_OIL | Status: DC | PRN
  Administered 2024-04-03 – 2024-04-04 (×2): 1 via TOPICAL

## 2024-04-01 MED ORDER — SODIUM CHLORIDE 0.9% FLUSH
3.0000 mL | INTRAVENOUS | Status: DC | PRN
  Administered 2024-04-02: 3 mL via INTRAVENOUS

## 2024-04-01 MED ORDER — ZOLPIDEM TARTRATE 5 MG PO TABS: 5.0000 mg | ORAL_TABLET | Freq: Every evening | ORAL | Status: DC | PRN

## 2024-04-01 MED ORDER — CLINDAMYCIN PHOSPHATE 900 MG/50ML IV SOLN
900.0000 mg | INTRAVENOUS | Status: AC
  Administered 2024-04-01: 900 mg via INTRAVENOUS

## 2024-04-01 MED ORDER — OXYTOCIN-SODIUM CHLORIDE 30-0.9 UT/500ML-% IV SOLN: 2.5000 [IU]/h | INTRAVENOUS | Status: AC

## 2024-04-01 MED ORDER — ACETAMINOPHEN 500 MG PO TABS
1000.0000 mg | ORAL_TABLET | Freq: Four times a day (QID) | ORAL | Status: DC
  Administered 2024-04-03 – 2024-04-05 (×9): 1000 mg via ORAL
  Filled 2024-04-01 (×10): qty 2

## 2024-04-01 MED ORDER — OXYCODONE HCL 5 MG PO TABS
5.0000 mg | ORAL_TABLET | ORAL | Status: DC | PRN
  Administered 2024-04-02 – 2024-04-05 (×8): 10 mg via ORAL
  Filled 2024-04-01 (×9): qty 2

## 2024-04-01 MED ORDER — STERILE WATER FOR IRRIGATION IR SOLN
Status: DC | PRN
  Administered 2024-04-01 (×2): 1

## 2024-04-01 MED ORDER — SOD CITRATE-CITRIC ACID 500-334 MG/5ML PO SOLN
30.0000 mL | ORAL | Status: AC
  Administered 2024-04-01: 30 mL via ORAL

## 2024-04-01 MED ORDER — ACETAMINOPHEN 10 MG/ML IV SOLN
INTRAVENOUS | Status: DC | PRN
Start: 2024-04-01 — End: 2024-04-01
  Administered 2024-04-01: 1000 mg via INTRAVENOUS

## 2024-04-01 SURGICAL SUPPLY — 38 items
BENZOIN TINCTURE PRP APPL 2/3 (GAUZE/BANDAGES/DRESSINGS) IMPLANT
CHLORAPREP W/TINT 26ML (MISCELLANEOUS) ×4 IMPLANT
CLAMP UMBILICAL CORD (MISCELLANEOUS) ×2 IMPLANT
CLOTH BEACON ORANGE TIMEOUT ST (SAFETY) ×2 IMPLANT
DERMABOND ADVANCED .7 DNX12 (GAUZE/BANDAGES/DRESSINGS) IMPLANT
DISSECTOR SURG LIGASURE 21 (MISCELLANEOUS) IMPLANT
DRSG OPSITE POSTOP 4X10 (GAUZE/BANDAGES/DRESSINGS) ×2 IMPLANT
ELECTRODE REM PT RTRN 9FT ADLT (ELECTROSURGICAL) ×2 IMPLANT
EXTRACTOR VACUUM KIWI (MISCELLANEOUS) IMPLANT
GAUZE PAD ABD 7.5X8 STRL (GAUZE/BANDAGES/DRESSINGS) IMPLANT
GAUZE SPONGE 4X4 12PLY STRL LF (GAUZE/BANDAGES/DRESSINGS) IMPLANT
GLOVE BIO SURGEON STRL SZ 6.5 (GLOVE) ×2 IMPLANT
GLOVE BIOGEL PI IND STRL 7.0 (GLOVE) ×4 IMPLANT
GLOVE ECLIPSE 6.5 STRL STRAW (GLOVE) IMPLANT
GOWN STRL REUS W/TWL LRG LVL3 (GOWN DISPOSABLE) ×4 IMPLANT
KIT ABG SYR 3ML LUER SLIP (SYRINGE) IMPLANT
MAT PREVALON FULL STRYKER (MISCELLANEOUS) IMPLANT
NDL HYPO 25X5/8 SAFETYGLIDE (NEEDLE) IMPLANT
NEEDLE HYPO 22GX1.5 SAFETY (NEEDLE) IMPLANT
NEEDLE HYPO 25X5/8 SAFETYGLIDE (NEEDLE) IMPLANT
NS IRRIG 1000ML POUR BTL (IV SOLUTION) ×2 IMPLANT
PACK C SECTION WH (CUSTOM PROCEDURE TRAY) ×2 IMPLANT
PAD OB MATERNITY 4.3X12.25 (PERSONAL CARE ITEMS) ×2 IMPLANT
RETRACTOR TRAXI PANNICULUS (MISCELLANEOUS) IMPLANT
RTRCTR C-SECT PINK 25CM LRG (MISCELLANEOUS) ×2 IMPLANT
STRIP CLOSURE SKIN 1/2X4 (GAUZE/BANDAGES/DRESSINGS) IMPLANT
SUT MNCRL 0 VIOLET CTX 36 (SUTURE) IMPLANT
SUT PDS AB 0 CTX 60 (SUTURE) ×2 IMPLANT
SUT VIC AB 0 CTX36XBRD ANBCTRL (SUTURE) ×2 IMPLANT
SUT VIC AB 2-0 CT1 TAPERPNT 27 (SUTURE) IMPLANT
SUT VIC AB 3-0 SH 27X BRD (SUTURE) IMPLANT
SUT VIC AB 4-0 KS 27 (SUTURE) IMPLANT
SYR 20ML LL LF (SYRINGE) IMPLANT
SYR 30ML LL (SYRINGE) IMPLANT
TAPE CLOTH SURG 4X10 WHT LF (GAUZE/BANDAGES/DRESSINGS) IMPLANT
TOWEL OR 17X24 6PK STRL BLUE (TOWEL DISPOSABLE) ×2 IMPLANT
TRAY FOLEY W/BAG SLVR 14FR LF (SET/KITS/TRAYS/PACK) ×2 IMPLANT
WATER STERILE IRR 1000ML POUR (IV SOLUTION) ×2 IMPLANT

## 2024-04-01 NOTE — Anesthesia Procedure Notes (Signed)
 Spinal  Patient location during procedure: OR Start time: 04/01/2024 7:40 AM End time: 04/01/2024 7:43 AM Reason for block: surgical anesthesia Staffing Performed: anesthesiologist  Anesthesiologist: Corinne Garnette BRAVO, MD Performed by: Corinne Garnette BRAVO, MD Authorized by: Corinne Garnette BRAVO, MD   Preanesthetic Checklist Completed: patient identified, IV checked, risks and benefits discussed, surgical consent, monitors and equipment checked, pre-op evaluation and timeout performed Spinal Block Patient position: sitting Prep: DuraPrep and site prepped and draped Patient monitoring: continuous pulse ox and blood pressure Approach: midline Location: L3-4 Injection technique: single-shot Needle Needle type: Pencan  Needle gauge: 24 G Assessment Events: CSF return Additional Notes Functioning IV was confirmed and monitors were applied. Sterile prep and drape, including hand hygiene, mask and sterile gloves were used. The patient was positioned and the spine was prepped. The skin was anesthetized with lidocaine .  Free flow of clear CSF was obtained prior to injecting local anesthetic into the CSF.  The spinal needle aspirated freely following injection.  The needle was carefully withdrawn.  The patient tolerated the procedure well. Consent was obtained prior to procedure with all questions answered and concerns addressed. Risks including but not limited to bleeding, infection, nerve damage, paralysis, failed block, inadequate analgesia, allergic reaction, high spinal, itching and headache were discussed and the patient wished to proceed.   Garnette Corinne, MD

## 2024-04-01 NOTE — Anesthesia Postprocedure Evaluation (Signed)
 Anesthesia Post Note  Patient: Sonia Holden  Procedure(s) Performed: CESAREAN SECTION, WITH BILATERAL TUBAL LIGATION (Abdomen) EXCISION, CYST, OVARY (Right: Abdomen)     Patient location during evaluation: PACU Anesthesia Type: Spinal Level of consciousness: awake, awake and alert and oriented Pain management: pain level controlled Vital Signs Assessment: post-procedure vital signs reviewed and stable Respiratory status: spontaneous breathing, nonlabored ventilation and respiratory function stable Cardiovascular status: blood pressure returned to baseline and stable Postop Assessment: no headache, no backache, spinal receding and no apparent nausea or vomiting Anesthetic complications: no   No notable events documented.  Last Vitals:  Vitals:   04/01/24 1030 04/01/24 1051  BP: (!) 146/92 (!) 140/84  Pulse: 83 84  Resp: 20 20  Temp: (!) 36.1 C (!) 36.4 C  SpO2: 98% 98%    Last Pain:  Vitals:   04/01/24 1146  TempSrc:   PainSc: 6    Pain Goal:                   [name]

## 2024-04-01 NOTE — Addendum Note (Signed)
 Addendum  created 04/01/24 1157 by Corinne Garnette BRAVO, MD   Attestation recorded in Intraprocedure, Intraprocedure Attestations filed

## 2024-04-01 NOTE — Op Note (Signed)
 CESAREAN SECTION Procedure Note  Patient: Sonia Holden  Preoperative Diagnosis:  IUP at [redacted]w[redacted]d, history of pudendal neuralgia, desired permanent sterilization, and right ovarian dermoid cyst  Postoperative Diagnosis: same, delivered  Procedure: primary low transverse C-section, bilateral salpingectomy, ovarian cystectomy     Surgeon: Rubie Husky, MD  Assistant surgeon: Majel Courts, MD  A skilled assistant was required for this case due to its complexity.  Anesthesia: Spinal anesthesia   Findings: Normal appearing uterus. Normal fallopian tubes bilaterally (removed). Normal left ovary. Right ovary with 4cm dermoid cyst (removed).  Viable female infant in vertex presentation delivered at 08:13am with weight 8lb7.1oz, Apgars 8 and 8.  Estimated Blood Loss:  637cc         Specimens: 1) Placenta to L&D, 2) fallopian tubes, 3) ovarian dermoid cyst         Complications:  None         Disposition: PACU - hemodynamically stable.         Condition: stable    Description of Procedure: The patient was taken to the operating room where spinal anesthesia was placed and found to be adequate. The patient was placed in the dorsal supine position.  Fetal heart tones were confirmed. SCD were applied and cycling. A foley catheter was inserted and draining. Gentamicin and clindamycin  were given for infection prophylaxis. The patient was subsequently prepped and draped in the normal sterile fashion.  A routine pre-operative time out was performed.  A low transverse skin incision was made with a scalpel and carried down to the level of the fascia with the Bovie. The fascia was incised in the midline with the scalpel and extended laterally with curved Mayo scissors.  Kocher clamps were applied to the superior fascial edge and the fascia was dissected off the rectus muscle sharply using the Mayo scissors. The rectus muscles then were separated in the midline.  The peritoneum was found free  of adherent bowel and the peritoneal cavity was entered bluntly.  The uterus was identified and the alexis retractor was placed intraperitoneal.  A low transverse hysterotomy was then made with a scalpel.  The infant was found in the vertex presentation was delivered atraumatically and without difficulty with standard maneuvers. No nuchal. Baby had good tone and intermittent cry and routine bulb suction of nares was performed. After 60 seconds of delayed cord clamping the cord was clamped and cut and the infant was handed off to the baby nurse. The placenta was delivered with gentle traction on umbilical cord and manual massage of the uterine fundus.  The uterus was cleared of all clot and debris.  The hysterotomy was then closed with 0 monocryl in a running locked fashion. The hysterotomy was found to be hemostatic after light Bovie use on serosal edges.   Attention was turned to the right adnexa. Right fallopian tube was followed down to the fimbriated end and removed from the mesosalpinx using the Ligasure with good hemostasis. The tube was passed off to the scrub tech. The dermoid cyst of the right ovary was incised using the Bovie. Sebaceous and adipose tissue and hair was suctioned out of the cyst. A firm lesion was removed from the cyst and given to the scrub to send to  pathology. The cyst wall was removed bluntly and passed off to the scrub to send to pathology. The Bovie was used to gain good hemostasis within the cyst opening. The ovarian tissue was closed with 3-0 Vicryl, again noting good hemostasis. Attention was  turned to the left adnexa. Left fallopian tube was followed down to the fimbriated end and removed from the mesosalpinx using the Ligasure with good hemostasis. The tube was passed off to the scrub tech. Good hemostasis was noted of bilateral adnexa and hysterotomy.  The Alexis retractor was removed. The peritoneum was closed in a vertical continuous fashion using 2-0 Vicryl. The  fascia was closed with a 0 PDS suture in a continuous running fashion.  The subcutaneous tissue was irrigated and rendered hemostatic with cautery.  The subcutaneous layer was subsequently closed with 2-0 plain gut in an interrupted fashion.  The skin was closed with 4-0 vicryl  in a running subcuticular fashion.  Sponge, lap and needle counts were correct. Dermabond and a Honeycomb dressing were placed on the incision.  The patient was doing well when I left the OR. Baby had been taken to NICU for respiratory support.   Rubie Husky, MD 04/01/2024, 09:37am

## 2024-04-01 NOTE — Plan of Care (Signed)
  Problem: Education: Goal: Knowledge of General Education information will improve Description: Including pain rating scale, medication(s)/side effects and non-pharmacologic comfort measures Outcome: Progressing   Problem: Health Behavior/Discharge Planning: Goal: Ability to manage health-related needs will improve Outcome: Progressing   Problem: Clinical Measurements: Goal: Ability to maintain clinical measurements within normal limits will improve Outcome: Progressing Goal: Will remain free from infection Outcome: Progressing Goal: Diagnostic test results will improve Outcome: Progressing Goal: Respiratory complications will improve Outcome: Progressing Goal: Cardiovascular complication will be avoided Outcome: Progressing   Problem: Activity: Goal: Risk for activity intolerance will decrease Outcome: Progressing   Problem: Nutrition: Goal: Adequate nutrition will be maintained Outcome: Progressing   Problem: Coping: Goal: Level of anxiety will decrease Outcome: Progressing   Problem: Elimination: Goal: Will not experience complications related to bowel motility Outcome: Progressing Goal: Will not experience complications related to urinary retention Outcome: Progressing   Problem: Pain Managment: Goal: General experience of comfort will improve and/or be controlled Outcome: Progressing   Problem: Safety: Goal: Ability to remain free from injury will improve Outcome: Progressing   Problem: Skin Integrity: Goal: Risk for impaired skin integrity will decrease Outcome: Progressing   Problem: Education: Goal: Knowledge of the prescribed therapeutic regimen will improve Outcome: Progressing Goal: Understanding of sexual limitations or changes related to disease process or condition will improve Outcome: Progressing Goal: Individualized Educational Video(s) Outcome: Progressing   Problem: Self-Concept: Goal: Communication of feelings regarding changes in body  function or appearance will improve Outcome: Progressing   Problem: Skin Integrity: Goal: Demonstration of wound healing without infection will improve Outcome: Progressing   Problem: Education: Goal: Knowledge of condition will improve Outcome: Progressing Goal: Individualized Educational Video(s) Outcome: Progressing Goal: Individualized Newborn Educational Video(s) Outcome: Progressing   Problem: Activity: Goal: Will verbalize the importance of balancing activity with adequate rest periods Outcome: Progressing Goal: Ability to tolerate increased activity will improve Outcome: Progressing   Problem: Coping: Goal: Ability to identify and utilize available resources and services will improve Outcome: Progressing   Problem: Life Cycle: Goal: Chance of risk for complications during the postpartum period will decrease Outcome: Progressing   Problem: Role Relationship: Goal: Ability to demonstrate positive interaction with newborn will improve Outcome: Progressing   Problem: Skin Integrity: Goal: Demonstration of wound healing without infection will improve Outcome: Progressing
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
## 2024-04-01 NOTE — Transfer of Care (Signed)
 Immediate Anesthesia Transfer of Care Note  Patient: Sonia Holden  Procedure(s) Performed: CESAREAN SECTION, WITH BILATERAL TUBAL LIGATION (Abdomen)  Patient Location: PACU  Anesthesia Type:Spinal  Level of Consciousness: awake, alert , and oriented  Airway & Oxygen Therapy: Patient Spontanous Breathing  Post-op Assessment: Report given to RN and Post -op Vital signs reviewed and stable  Post vital signs: Reviewed and stable  Last Vitals:  Vitals Value Taken Time  BP 140/94 04/01/24 10:15  Temp 36 C 04/01/24 10:00  Pulse 88 04/01/24 10:15  Resp 22 04/01/24 10:15  SpO2 97 % 04/01/24 10:15  Vitals shown include unfiled device data.  Last Pain:  Vitals:   04/01/24 1000  TempSrc: Oral  PainSc: 0-No pain         Complications: No notable events documented.

## 2024-04-02 ENCOUNTER — Encounter (HOSPITAL_COMMUNITY)

## 2024-04-02 ENCOUNTER — Inpatient Hospital Stay (HOSPITAL_COMMUNITY)

## 2024-04-02 LAB — CBC
HCT: 31.9 % — ABNORMAL LOW (ref 36.0–46.0)
Hemoglobin: 10.4 g/dL — ABNORMAL LOW (ref 12.0–15.0)
MCH: 26.8 pg (ref 26.0–34.0)
MCHC: 32.6 g/dL (ref 30.0–36.0)
MCV: 82.2 fL (ref 80.0–100.0)
Platelets: 241 K/uL (ref 150–400)
RBC: 3.88 MIL/uL (ref 3.87–5.11)
RDW: 14.4 % (ref 11.5–15.5)
WBC: 11.5 K/uL — ABNORMAL HIGH (ref 4.0–10.5)
nRBC: 0 % (ref 0.0–0.2)

## 2024-04-02 LAB — GLUCOSE, CAPILLARY: Glucose-Capillary: 107 mg/dL — ABNORMAL HIGH (ref 70–99)

## 2024-04-02 MED ORDER — LIDOCAINE-EPINEPHRINE (PF) 2 %-1:200000 IJ SOLN
INTRAMUSCULAR | Status: AC
  Filled 2024-04-02: qty 20

## 2024-04-02 MED ORDER — HYDROXYZINE HCL 50 MG PO TABS
25.0000 mg | ORAL_TABLET | Freq: Three times a day (TID) | ORAL | Status: DC | PRN
Start: 2024-04-02 — End: 2024-04-05
  Administered 2024-04-02: 25 mg via ORAL
  Filled 2024-04-02: qty 1

## 2024-04-02 MED ORDER — BUPIVACAINE IN DEXTROSE 0.75-8.25 % IT SOLN
INTRATHECAL | Status: AC
  Filled 2024-04-02: qty 2

## 2024-04-02 MED ORDER — ARTIFICIAL TEARS OPHTHALMIC OINT
TOPICAL_OINTMENT | OPHTHALMIC | Status: AC
  Filled 2024-04-02: qty 3.5

## 2024-04-02 MED ORDER — PHENYLEPHRINE 80 MCG/ML (10ML) SYRINGE FOR IV PUSH (FOR BLOOD PRESSURE SUPPORT)
PREFILLED_SYRINGE | INTRAVENOUS | Status: AC
  Filled 2024-04-02: qty 10

## 2024-04-02 MED ORDER — LIDOCAINE HCL (PF) 1 % IJ SOLN
INTRAMUSCULAR | Status: AC
  Filled 2024-04-02: qty 5

## 2024-04-02 MED ORDER — SCOPOLAMINE 1 MG/3DAYS TD PT72
MEDICATED_PATCH | TRANSDERMAL | Status: AC
  Filled 2024-04-02: qty 1

## 2024-04-02 MED ORDER — CHLOROPROCAINE HCL (PF) 3 % IJ SOLN
INTRAMUSCULAR | Status: AC
  Filled 2024-04-02: qty 20

## 2024-04-02 MED ORDER — LIDOCAINE 2% (20 MG/ML) 5 ML SYRINGE
INTRAMUSCULAR | Status: AC
  Filled 2024-04-02: qty 5

## 2024-04-02 MED ORDER — DEXMEDETOMIDINE HCL IN NACL 80 MCG/20ML IV SOLN
INTRAVENOUS | Status: AC
  Filled 2024-04-02: qty 20

## 2024-04-02 NOTE — Lactation Note (Signed)
 This note was copied from a baby's chart.  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 Her plan is to do both, direct breastfeeding long with pumping and bottle feeding, she would like to keep baby exclusively on breastmilk if possible.   Family requested latch assistance, baby was cueing, awake and alert. This LC took Sonia Holden to the bare R side in cross cradle hold and he was able to latch right away with a big wide open mouth. He engaged in rhythmical sucking patterns but also did a fair amount of NNS during this 15 minutes feeding (see LATCH score). Let Sonia Holden know that we'll continue supplementing Sonia Holden with donor milk until hers come in, doing an ad lib trial now since baby pulled his NG tube and is term age.  Reviewed normal newborn behavior, feeding cues, pumping schedule, pumping log, secretory activation, lactogenesis III, CDC and anticipatory guidelines.  OBJECTIVE Infant data: Mother's Current Feeding Choice: Breast Milk and Donor Milk  O2 Device: Room Air FiO2 (%): 21 %  Infant feeding assessment IDFTS - Readiness: 2 IDFTS - Quality: 3   Maternal data: G2P1011 C-Section, Low Transverse Has patient been taught Hand Expression?: Yes Hand Expression Comments: colostrum noted Significant Breast History:: moderate breast changes during the pregnancy Current breast feeding challenges:: NICU admission Does the patient have breastfeeding experience prior to this delivery?: No Pumping frequency:  initiated pumping at 6 hours post-partum Pumped volume: 5 mL Flange Size: 21 Risk factor for low/delayed milk supply:: primipara, prematurity, hypothyroid, GDM2, AMA, infant separation  Pump: DEBP, Personal (Spectra  S1)  ASSESSMENT Infant: Latch: Grasps breast easily, tongue down, lips flanged, rhythmical sucking. Audible Swallowing: None Type of Nipple: Everted at rest and after stimulation Comfort (Breast/Nipple): Soft / non-tender Hold (Positioning): Assistance needed to correctly position infant at breast and maintain latch. LATCH Score: 7  Feeding Status: Ad lib Feeding method: Breast Nipple Type: Dr. Jonna Fling Preemie  Maternal: Milk volume: Normal Breasts are spaced out about 3-4 inches  INTERVENTIONS/PLAN Interventions: Interventions: Breast feeding basics reviewed; Assisted with latch; Skin to skin; Breast massage; Breast compression; Adjust position; Support pillows; Coconut oil; DEBP; Education; [employer] brochure; CDC milk storage guidelines; CDC Guidelines for Breast Pump Cleaning; NICU Pumping Log Tools: Pump; Flanges; Coconut oil Pump Education: Setup, frequency, and cleaning; Milk Storage  Plan: STS around care times Breast massage, hand expression and coconut oil prior to pumping Pump every 3 hours for 15 minutes on initiate mode, ideally 8 pumping sessions in 24/hours Offer the breast on feeding cues around feeding times and call for assistance PRN  Female visitor present and supportive. All questions and concerns answered, family to contact Sonia Holden services PRN.  Consult Status: NICU follow-up NICU Follow-up type: New admission follow up   Sonia Holden S Sonia Holden 04/02/2024, 2:15 PM

## 2024-04-02 NOTE — Progress Notes (Signed)
  Vitals:   04/02/24 0826 04/02/24 1417 04/02/24 1548 04/02/24 1549  BP: 109/68 134/83  126/82  Pulse: 90 (!) 101  (!) 113  Resp:    17  Temp:  98.1 F (36.7 C) 98.2 F (36.8 C)   TempSrc:  Oral Oral   SpO2: 97% 98%  98%  Weight:      Height:        BP reviewed--consistently at goal < 140/90

## 2024-04-02 NOTE — Progress Notes (Signed)
 Patient is doing well.  She is tolerating PO, ambulating, voiding without difficulty.  Having pain when walking / transitioning from the bed to standing.  Also notes itching and wonders about hydroxyzine  (has tolerated in the past).  Lochia is appropriate  Vitals:   04/01/24 2338 04/02/24 0422 04/02/24 0825 04/02/24 0826  BP: 128/64 122/69  109/68  Pulse: 92 78  90  Resp: 18 18    Temp: 97.7 F (36.5 C)  98.2 F (36.8 C)   TempSrc: Oral  Oral   SpO2: 98% 100%  97%  Weight:      Height:        NAD Abdomen:  soft, appropriate tenderness, incisions intact and without erythema or drainage ext:    Symmetric, 1+ edema bilaterally  Lab Results  Component Value Date   WBC 11.5 (H) 04/02/2024   HGB 10.4 (L) 04/02/2024   HCT 31.9 (L) 04/02/2024   MCV 82.2 04/02/2024   PLT 241 04/02/2024    --/--/O POS (10/30 9375)  A/P    38 y.o. G2P1011 POD #1 s/p elective primary cesarean delivery, bilateral salpingectomy, cystectomy for ovarian dermoid Routine post op and postpartum care.  Discussed multi-modal pain approach--on ibuprofen , acetaminophen , gabapentin ; oxycodone  prn RA: on plaquenil  Depression: on prozac  Hypothyroidism: on synthroid  GDMA1: was on semglee  14U at bedtime.  Will repeat fasting blood glucose tomorrow--107 today Ppx: lovenox, scds

## 2024-04-02 NOTE — Social Work (Signed)
 CSW acknowledges consult. CSW attempted to meet with MOB, however, MOB was on her way to the NICU to see her infant. CSW agreed to meet with MOB at a later time today.    Nat Quiet, MSW, LCSW Clinical Social Worker  [phone] 05/31/2024  11:34 AM

## 2024-04-02 NOTE — Clinical Social Work Maternal (Signed)
 CLINICAL SOCIAL WORK MATERNAL/CHILD NOTE  Patient Details  Name: Sonia Holden MRN: [id number] Date of Birth: [birth year]  Date:  04/02/2024  Clinical Social Worker Initiating Note:  [place] Date/Time: Initiated:  04/02/24/1343     Child's Name:  Sonia Holden   Biological Parents:  Mother, Father (FOB is Elspeth Holden 09/18/77 [phone])   Need for Interpreter:  None   Reason for Referral:  Behavioral Health Concerns   Address:  [address] 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 [employer]   Education:  Engineer, maintenance (it)   Homebound arranged:    Surveyor, Quantity Resources:  Media Planner    Other Resources:   (Per MOB, she is not eligible for [employer] assistance programs.)   Cultural/Religious Considerations Which May Impact Care:  none reported  Strengths:  Ability to meet basic needs  , Home prepared for child  , Understanding of illness, Psychotropic Medications (Peds list provided.)   Psychotropic Medications:  Prozac , Other meds (Vistril)      Pediatrician:       Pediatrician List:   [facility] [place]    [place]    [facility]      Pediatrician Fax Number:    Risk Factors/Current Problems:  Mental Health Concerns     Cognitive State:  Insightful  , Linear Thinking  , Goal Oriented  , Able to Concentrate      Mood/Affect:  Calm  , Comfortable  , Interested  , Happy  , Relaxed     CSW Assessment: CSW meet with MOB at infant's bedside in room 309 to complete an assessment for mental health hx.  When CSW arrived, MOB was bonding with infant as evidence by engaging infant massages and providing skin to skin.  MOB and infant appeared happy and comfortable. MOB  was appeared polite and she was easy to engage.  CSW inquired about MOB's MH and MOB acknowledged a hx of anxiety and depression and reported that she was dx about 20 years ago.  Per MOB, her symptoms are currently managed by daily medications Prozac  and Vistaril . MOB also shared that she also meets with her NP Psy Catheryn Pander routinely. Per MOB, her next scheduled appt is scheduled on 04/07/2024. CSW educated MOB about PMADs. CSW informed MOB of possible supports and interventions to decrease PPD.  CSW also encouraged MOB to seek medical attention if needed for increased signs and symptoms of PPD.  CSW encouraged MOB to evaluate her mental health throughout the postpartum period with the use of the New Mom Checklist  developed by Postpartum Progress and notify a medical professional if symptoms arise; MOB agreed. MOB presented with insight and awareness and denied SI, HI, and DV when assessed for safety. MOB reported having a good support team that will be willing to help if needed. CSW reviewed safe sleep and SIDS. MOB communicated that MOB has everything she needs for the baby and is prepared to meet her infant's needs.  MOB did not have any further questions, concerns, or needs currently. MOB declined weekly CSW check-ins and communicated that will reach out to SW if a need arises.   CSW thanked MOB for allowing CSW to meet with her.  There are no barriers to discharge.   CSW Plan/Description:  No Further Intervention Required/No Barriers to Discharge, Sudden Infant Death Syndrome (SIDS) Education, Perinatal Mood and Anxiety Disorder (PMADs)  Education, Other Information/Referral to [employer], MSW, [employer] Clinical Social Work [phone]   CLAYBORNE JONETTA HOPE, LCSW 04/02/2024, 1:50 PM

## 2024-04-03 LAB — CBC
HCT: 31.8 % — ABNORMAL LOW (ref 36.0–46.0)
Hemoglobin: 10.2 g/dL — ABNORMAL LOW (ref 12.0–15.0)
MCH: 26.4 pg (ref 26.0–34.0)
MCHC: 32.1 g/dL (ref 30.0–36.0)
MCV: 82.2 fL (ref 80.0–100.0)
Platelets: 298 K/uL (ref 150–400)
RBC: 3.87 MIL/uL (ref 3.87–5.11)
RDW: 14.6 % (ref 11.5–15.5)
WBC: 11.6 K/uL — ABNORMAL HIGH (ref 4.0–10.5)
nRBC: 0.2 % (ref 0.0–0.2)

## 2024-04-03 LAB — COMPREHENSIVE METABOLIC PANEL WITH GFR
ALT: 19 U/L (ref 0–44)
AST: 18 U/L (ref 15–41)
Albumin: 2.2 g/dL — ABNORMAL LOW (ref 3.5–5.0)
Alkaline Phosphatase: 85 U/L (ref 38–126)
Anion gap: 8 (ref 5–15)
BUN: 10 mg/dL (ref 6–20)
CO2: 20 mmol/L — ABNORMAL LOW (ref 22–32)
Calcium: 8.4 mg/dL — ABNORMAL LOW (ref 8.9–10.3)
Chloride: 107 mmol/L (ref 98–111)
Creatinine, Ser: 0.75 mg/dL (ref 0.44–1.00)
GFR, Estimated: >60 mL/min (ref >60)
Glucose, Bld: 76 mg/dL (ref 70–99)
Potassium: 4 mmol/L (ref 3.5–5.1)
Sodium: 135 mmol/L (ref 135–145)
Total Bilirubin: 0.3 mg/dL (ref 0.0–1.2)
Total Protein: 5.2 g/dL — ABNORMAL LOW (ref 6.5–8.1)

## 2024-04-03 LAB — GLUCOSE, CAPILLARY: Glucose-Capillary: 129 mg/dL — ABNORMAL HIGH (ref 70–99)

## 2024-04-03 MED ORDER — LABETALOL HCL 100 MG PO TABS
100.0000 mg | ORAL_TABLET | Freq: Two times a day (BID) | ORAL | Status: DC
  Administered 2024-04-03: 100 mg via ORAL
  Filled 2024-04-03: qty 1

## 2024-04-03 MED ORDER — LEVOTHYROXINE SODIUM 25 MCG PO TABS
75.0000 ug | ORAL_TABLET | Freq: Every day | ORAL | Status: DC
  Administered 2024-04-04 – 2024-04-05 (×2): 75 ug via ORAL
  Filled 2024-04-03 (×2): qty 3

## 2024-04-03 NOTE — Lactation Note (Signed)
 This note was copied from a baby's chart.  NICU Lactation Consultation Note  Patient Name: Sonia Holden Date: 04/03/2024 Age:40 hours  Reason for consult: Follow-up assessment; Primapara; 1st time breastfeeding; NICU baby; Maternal endocrine disorder; Other (Comment) (AMA, [facility] employee) Type of Endocrine Disorder?: Diabetes; Thyroid  (GDMA2 (insulin ), Hashimoto disease (synthroid ))  SUBJECTIVE Visited with family of 52 39/5 weeks old NICU female Sonia Holden; Sonia Holden is a P1 and reported she's been pumping consistently and getting enough to collect, praised her for all her efforts. She inquired if she could take baby to breast but he was asleep, he just had a bottle with 40 ml of donor milk. Asked her to call for assistance when needed.   She had questions regarding keeping baby exclusively on breastmilk after discharge in case she's not producing enough. Provided information about the [facility] milk bank in [place], [place] which is the [facility] bank where we get our milk from. Baby is feeding well on ad lib status and anticipating discharge to go back to room in with MOB if feedings continuing going well.   OBJECTIVE Infant data: Mother's Current Feeding Choice: Breast Milk and Donor Milk  O2 Device: Room Air FiO2 (%): 21 %  Infant feeding assessment IDFTS - Readiness: 1 IDFTS - Quality: 2   Maternal data: G2P1011 C-Section, Low Transverse Has patient been taught Hand Expression?: Yes Hand Expression Comments: colostrum noted Significant Breast History:: moderate breast changes during the pregnancy Current breast feeding challenges:: NICU admission Does the patient have breastfeeding experience prior to this delivery?: No Pumping frequency: 6 times/24 hours Pumped volume: 2 mL Flange Size: 21 Risk factor for low/delayed milk supply:: primipara, prematurity, hypothyroid, GDM2, AMA, infant separation  Pump: DEBP, Personal (Spectra  S1)  ASSESSMENT Infant: Feeding Status: Ad  lib Feeding method: Bottle Nipple Type: Dr. Jonna Fling Preemie  Maternal: Milk volume: Normal  INTERVENTIONS/PLAN Interventions: Interventions: Breast feeding basics reviewed; Coconut oil; DEBP; Education Tools: Pump; Flanges; Coconut oil Pump Education: Setup, frequency, and cleaning; Milk Storage  Plan: STS around care times Breast massage, hand expression and coconut oil prior to pumping Pump every 3 hours for 15 minutes on initiate mode, ideally 8 pumping sessions in 24/hours Offer the breast on feeding cues around feeding times and call for assistance PRN   FOB present and supportive. All questions and concerns answered, family to contact [facility] services PRN.  Consult Status: Follow-up NICU Follow-up type: New admission follow up   Jerrald Doverspike S Hyland Mollenkopf 04/03/2024, 11:45 AM

## 2024-04-03 NOTE — Plan of Care (Signed)
  Problem: Clinical Measurements: Goal: Will remain free from infection Outcome: Progressing Goal: Diagnostic test results will improve Outcome: Progressing Goal: Respiratory complications will improve Outcome: Progressing Goal: Cardiovascular complication will be avoided Outcome: Progressing   Problem: Coping: Goal: Level of anxiety will decrease Outcome: Progressing   Problem: Elimination: Goal: Will not experience complications related to bowel motility Outcome: Progressing   Problem: Pain Managment: Goal: General experience of comfort will improve and/or be controlled Outcome: Progressing   Problem: Skin Integrity: Goal: Risk for impaired skin integrity will decrease Outcome: Progressing   Problem: Self-Concept: Goal: Communication of feelings regarding changes in body function or appearance will improve Outcome: Progressing   Problem: Skin Integrity: Goal: Demonstration of wound healing without infection will improve Outcome: Progressing   Problem: Life Cycle: Goal: Chance of risk for complications during the postpartum period will decrease Outcome: Progressing   Problem: Role Relationship: Goal: Ability to demonstrate positive interaction with newborn will improve Outcome: Progressing   Problem: Skin Integrity: Goal: Demonstration of wound healing without infection will improve Outcome: Progressing

## 2024-04-03 NOTE — Progress Notes (Signed)
 POD2 Patient is doing well.  She is tolerating PO, ambulating, voiding without difficulty, passing gas. No BM yet. Having pain when walking when due for next dose of pain med. Lochia is appropriate. Baby boy doing well in NICU, per patient could be coming down to her today.   Vitals:   04/02/24 1548 04/02/24 1549 04/02/24 1939 04/02/24 2354  BP:  126/82 (!) 140/79 (!) 134/58  Pulse:  (!) 113 93 94  Resp:  17 18 18   Temp: 98.2 F (36.8 C)  97.7 F (36.5 C) 97.9 F (36.6 C)  TempSrc: Oral  Oral Oral  SpO2:  98%  96%  Weight:      Height:        NAD Abdomen:  soft, appropriate tenderness, incision intact and without erythema or drainage ext:    Symmetric, 1+ edema bilaterally, no e/o VTE  Lab Results  Component Value Date   WBC 11.5 (H) 04/02/2024   HGB 10.4 (L) 04/02/2024   HCT 31.9 (L) 04/02/2024   MCV 82.2 04/02/2024   PLT 241 04/02/2024    --/--/O POS (10/30 9375)  A/P    38 y.o. G2P1011 POD #2 s/p elective primary cesarean delivery, bilateral salpingectomy, cystectomy for ovarian dermoid Routine post op and postpartum care, multi-modal pain management Elevated BPs: No elevated Bps in clinic. Inpatient, elevated Bps were in the setting of anxiety pre-op, surgery, and then pain in the immediate postop period. Otherwise, one mild range blood pressure yesterday evening. If any additional MRBPs, will rule in for postpartum gestational hypertension and order labs. Discussed with patient.  RA: on plaquenil  Depression: on prozac  Hypothyroidism: on synthroid . Patient uncertain of pre-pregnancy dosing, earliest recorded dose in this pregnancy 75mcg Synthroid , will order this dose.  GDMA1: CBG this AM 129 just after eating and drinking OJ. plan 6w postpartum 2 hour GTT.  Baby boy: desires circ. Still in NICU, will defer until tomorrow if released, orders in. Ppx: lovenox, scds  Dispo: Anticipate discharge POD#3

## 2024-04-03 NOTE — Progress Notes (Signed)
 Brief Update Note      04/03/2024    7:43 PM 04/03/2024    2:08 PM 04/03/2024    8:07 AM  Vitals with BMI  Systolic 140 142 860  Diastolic 76 84 76  Pulse 109 100 106     Patient now meeting criteria for postpartum gestational hypertension by blood pressures. Significant edema noted but denies headache, vision changes, shortness of breath, and RUQ pain. Will order labs now and start 100mg  labetalol BID.  Addendum: Patient seen/evaluated around 8pm.  PE:  NAD Normal WOB on room air Well perfused 2+ edema of legs to knee firm fundus, incision with Honeycomb without strikethrough.   gHTN diagnosis discussed with patient and her family at the bedside. Nikki voiced understanding and all questions were answered.

## 2024-04-04 LAB — GLUCOSE, CAPILLARY: Glucose-Capillary: 85 mg/dL (ref 70–99)

## 2024-04-04 MED ORDER — LABETALOL HCL 200 MG PO TABS
400.0000 mg | ORAL_TABLET | Freq: Two times a day (BID) | ORAL | Status: DC
  Filled 2024-04-04: qty 2

## 2024-04-04 MED ORDER — LABETALOL HCL 200 MG PO TABS: 300.0000 mg | ORAL_TABLET | Freq: Two times a day (BID) | ORAL | Status: DC

## 2024-04-04 MED ORDER — LABETALOL HCL 200 MG PO TABS
200.0000 mg | ORAL_TABLET | Freq: Two times a day (BID) | ORAL | Status: DC
Start: 2024-04-04 — End: 2024-04-04

## 2024-04-04 MED ORDER — FUROSEMIDE 40 MG PO TABS
40.0000 mg | ORAL_TABLET | Freq: Once | ORAL | Status: AC
  Administered 2024-04-04: 40 mg via ORAL
  Filled 2024-04-04: qty 1

## 2024-04-04 MED ORDER — LABETALOL HCL 200 MG PO TABS
300.0000 mg | ORAL_TABLET | Freq: Two times a day (BID) | ORAL | Status: DC
  Administered 2024-04-04 (×2): 300 mg via ORAL
  Filled 2024-04-04 (×2): qty 1

## 2024-04-04 NOTE — Lactation Note (Signed)
 This note was copied from a baby's chart. Lactation Consultation Note  Patient Name: Sonia Holden Date: 04/04/2024 Age:40 hours Reason for consult: Follow-up assessment;Breastfeeding assistance;Maternal endocrine disorder;Other (Comment);1st time breastfeeding;Primapara;Term (AMA,)  LC in to visit with P1 Mom of term [name] who is now rooming in with parents.  Baby admitted to NICU for respiratory support for about 48 hrs.  Mom is breast and bottle feeding currently and pumping some.    LC offered to assist with a feeding on the breast.  Baby placed STS on Mom's chest, he started to suckle on his hand.  Mom in laid back position in bed.  LC reviewed breast massage and hand expression, transitional milk sprayed out of left nipple.  Baby placed prone over left breast where he latched easily and with a wide, deep latch.  Baby sucking with nutritive suck pattern and remained very contented and relaxed.  Mom denied any discomfort.  Mom did say her nipples were a little sore from pumping, re-sized her with 18 mm flanges.  LC provided a pumping top and Mom pumping on right breast due to milk dripping during feeding at the other breast.    Mom desires to breastfeed, pumping after to stimulate her milk supply due to her risk factors and visit to the NICU.  LC reviewed importance of disassembling pump parts, washing, rinsing and placing on clean paper towel to air dry.  Plan written on dry erase board- 1- STS with baby 2- Breast feed baby often with feeding cues 3- Pump both breasts after breastfeeding or if baby receives bottle feeding. 4- ask for help prn 5- OP lactation F/U very important  Feeding Nipple Type: Slow - flow  LATCH Score Latch: Grasps breast easily, tongue down, lips flanged, rhythmical sucking.  Audible Swallowing: Spontaneous and intermittent  Type of Nipple: Everted at rest and after stimulation  Comfort (Breast/Nipple): Soft / non-tender  Hold  (Positioning): Assistance needed to correctly position infant at breast and maintain latch.  LATCH Score: 9   Lactation Tools Discussed/Used Tools: Pump;Flanges;Bottle;Hands-free pumping top Flange Size: 18 Breast pump type: Double-Electric Breast Pump Pump Education: Setup, frequency, and cleaning;Milk Storage Reason for Pumping: support milk supply Pumping frequency: Mom pumped 4 times yesterday Pumped volume: 5 mL  Interventions Interventions: Breast feeding basics reviewed;Assisted with latch;Skin to skin;Breast massage;Hand express;Breast compression;Adjust position;Support pillows;Position options;Expressed milk;DEBP;Education  Discharge Discharge Education: Engorgement and breast care;Outpatient recommendation Pump: DEBP;Employee Pump (Spectra , Mom to order smaller flanges) WIC Program: No  Consult Status Consult Status: Follow-up Date: 04/05/24 Follow-up type: In-patient    Sonia Holden 04/04/2024, 11:29 AM

## 2024-04-04 NOTE — Progress Notes (Signed)
 Brief Update Note     04/04/2024    5:15 AM 04/03/2024   11:01 PM 04/03/2024    7:43 PM  Vitals with BMI  Systolic 140 143 859  Diastolic 86 69 76  Pulse 101 98 109     BPs still mildly elevated in spite of 100mg  labetalol yesterday evening. Will uptitrate to 200mg  BID labetalol for AM dose. Of note, PIH labs were wnl.

## 2024-04-04 NOTE — Progress Notes (Signed)
 Patient is doing well.  She is tolerating PO, ambulating, voiding.  Pain is controlled.  Lochia is appropriate.  BPs were trending up yesterday and she was started on labetalol 100 mg BID and titrated to 200 mg BID without significant improvement.  Does note significant LE edema and was difficult to move toes yesterday  Vitals:   04/03/24 1943 04/03/24 2301 04/04/24 0515 04/04/24 0746  BP: (!) 140/76 (!) 143/69 (!) 140/86 (!) 140/69  Pulse: (!) 109 98 (!) 101 (!) 105  Resp: 20 19 20 18   Temp: 97.9 F (36.6 C) 97.9 F (36.6 C) 97.8 F (36.6 C) 98.4 F (36.9 C)  TempSrc: Oral Oral Oral Oral  SpO2: 100%  98% 98%  Weight:      Height:        NAD Abdomen:  soft, appropriate tenderness, incisions intact and without erythema or drainage ext:    Symmetric, 3+ edema bilaterally to knees  Lab Results  Component Value Date   WBC 11.6 (H) 04/03/2024   HGB 10.2 (L) 04/03/2024   HCT 31.8 (L) 04/03/2024   MCV 82.2 04/03/2024   PLT 298 04/03/2024    --/--/O POS (10/30 9375)  A/P    38 y.o. G2P1011 POD #3 s/p elective primary c/s and BS Routine post op and postpartum care.   GHTN:  also with significant LE edema.  Will give dose of lasix now and increase labetalol to 300 mg BID.  WIll monitor BPs q2h today as she desires discharge home if able Desires circumcision.   Discussed r/b/a of the procedure.  Reviewed that circumcision is an elective surgical procedure and not considered medically necessary.  Reviewed the risks of the procedure including the risk of infection, bleeding, damage to surrounding structures, including scrotum, shaft, urethra and head of penis, and an undesired cosmetic effect requiring additional procedures for revision.  Consent signed.

## 2024-04-05 ENCOUNTER — Other Ambulatory Visit (HOSPITAL_COMMUNITY): Payer: Self-pay

## 2024-04-05 LAB — CBC WITH DIFFERENTIAL/PLATELET
Abs Immature Granulocytes: 0.05 K/uL (ref 0.00–0.07)
Basophils Absolute: 0.1 K/uL (ref 0.0–0.1)
Basophils Relative: 1 %
Eosinophils Absolute: 0.5 K/uL (ref 0.0–0.5)
Eosinophils Relative: 5 %
HCT: 31.3 % — ABNORMAL LOW (ref 36.0–46.0)
Hemoglobin: 10.2 g/dL — ABNORMAL LOW (ref 12.0–15.0)
Immature Granulocytes: 1 %
Lymphocytes Relative: 29 %
Lymphs Abs: 3 K/uL (ref 0.7–4.0)
MCH: 26.8 pg (ref 26.0–34.0)
MCHC: 32.6 g/dL (ref 30.0–36.0)
MCV: 82.4 fL (ref 80.0–100.0)
Monocytes Absolute: 0.7 K/uL (ref 0.1–1.0)
Monocytes Relative: 7 %
Neutro Abs: 6.2 K/uL (ref 1.7–7.7)
Neutrophils Relative %: 57 %
Platelets: 348 K/uL (ref 150–400)
RBC: 3.8 MIL/uL — ABNORMAL LOW (ref 3.87–5.11)
RDW: 15.1 % (ref 11.5–15.5)
WBC: 10.5 K/uL (ref 4.0–10.5)
nRBC: 0 % (ref 0.0–0.2)

## 2024-04-05 LAB — HEPATIC FUNCTION PANEL
ALT: 31 U/L (ref 0–44)
AST: 31 U/L (ref 15–41)
Albumin: 2.2 g/dL — ABNORMAL LOW (ref 3.5–5.0)
Alkaline Phosphatase: 82 U/L (ref 38–126)
Bilirubin, Direct: <0.1 mg/dL (ref 0.0–0.2)
Total Bilirubin: 0.2 mg/dL (ref 0.0–1.2)
Total Protein: 5.3 g/dL — ABNORMAL LOW (ref 6.5–8.1)

## 2024-04-05 LAB — GLUCOSE, CAPILLARY: Glucose-Capillary: 94 mg/dL (ref 70–99)

## 2024-04-05 MED ORDER — OXYCODONE-ACETAMINOPHEN 5-325 MG PO TABS: 1.0000 | ORAL_TABLET | ORAL | 0 refills | Status: AC | PRN

## 2024-04-05 MED ORDER — LABETALOL HCL 200 MG PO TABS
400.0000 mg | ORAL_TABLET | Freq: Three times a day (TID) | ORAL | Status: DC
  Administered 2024-04-05 (×2): 400 mg via ORAL
  Filled 2024-04-05 (×2): qty 2

## 2024-04-05 MED ORDER — LABETALOL HCL 200 MG PO TABS: 400.0000 mg | ORAL_TABLET | Freq: Three times a day (TID) | ORAL | Status: DC

## 2024-04-05 MED ORDER — LABETALOL HCL 200 MG PO TABS: 400.0000 mg | ORAL_TABLET | Freq: Three times a day (TID) | ORAL | 1 refills | Status: AC

## 2024-04-05 MED ORDER — IBUPROFEN 600 MG PO TABS: 600.0000 mg | ORAL_TABLET | Freq: Four times a day (QID) | ORAL | 1 refills | Status: AC | PRN

## 2024-04-05 MED ORDER — FUROSEMIDE 10 MG/ML IJ SOLN
40.0000 mg | Freq: Once | INTRAMUSCULAR | Status: AC
  Administered 2024-04-05: 40 mg via INTRAVENOUS
  Filled 2024-04-05: qty 4

## 2024-04-05 NOTE — Lactation Note (Signed)
 This note was copied from a baby's chart. Lactation Consultation Note  Patient Name: Sonia Holden Date: 04/05/2024 Age:40 days Reason for consult: Follow-up assessment;Primapara;1st time breastfeeding;Maternal endocrine disorder;Other (Comment);Term (GHTN, AMA)  LC in to visit with P1 Mom of [name].  Baby has been both breast and formula feeding by bottle.  Baby is at a 4.6% weight loss with good output and bilirubin WNL.  Mom currently pumping.  Mom states she hasn't been pumping regularly, but baby has been breastfeeding and bottle feeding.  Mom states baby is latching well.  Milk is slow to increase as Mom is on Lasix for BP.  Mom encouraged to stay hydrated and pump consistently to stimulate her milk supply.  Mom's DC may be later today, or she may stay another night.  Mom aware of lactation support available to her and encouraged her to call prn.  Mom desires OP lactation support, message sent to clinic.  Plan- 1- STS 2- Breastfeed baby with feeding cues 3-Pump both breasts after breastfeeding each time 4- supplement per volume guidelines, using EBM first before adding formula. Lactation Tools Discussed/Used Tools: Pump;Flanges;Hands-free pumping top Flange Size: 18 Breast pump type: Double-Electric Breast Pump Pump Education: Setup, frequency, and cleaning;Milk Storage Reason for Pumping: support milk supply Pumping frequency: Has not pumped since yesterday, will pump after breastfeeding or every 3 hrs today Pumped volume: 5 mL (5-10)  Interventions Interventions: Breast feeding basics reviewed;Skin to skin;Breast massage;Hand express;DEBP;Education  Discharge Discharge Education: Engorgement and breast care;Warning signs for feeding baby;Outpatient recommendation;Outpatient Epic message sent  Consult Status Consult Status: Follow-up Date: 04/06/24 Follow-up type: Out-patient    Sonia Holden 04/05/2024, 10:36 AM

## 2024-04-05 NOTE — Progress Notes (Signed)
  Vitals:   04/04/24 1608 04/04/24 1934 04/04/24 2346 04/05/24 0506  BP: 131/85 (!) 148/75 (!) 142/91 (!) 144/86  Pulse: 98 (!) 101 100 99  Resp: 17 17 18 18   Temp: 98 F (36.7 C)  98 F (36.7 C)   TempSrc: Oral  Oral   SpO2: 98% 99% 100% 98%  Weight:      Height:       BPs increased again overnight  Will change to labetalol 400 mg and frequency to TID, first dose now.  RN alerted of plan

## 2024-04-05 NOTE — Discharge Summary (Signed)
 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 yo G2P1011 at [redacted]w[redacted]d was admitted to the hospital 04/01/2024 for scheduled cesarean section with the following indication:Elective Primary.Delivery details are as follows:  Membrane Rupture Time/Date: 8:12 AM,04/01/2024  Delivery Method:C-Section, Low Transverse Operative Delivery:N/A Details of operation can be found in separate operative note.  Patient had a postpartum course complicated by elevated BP.  She is ambulating, tolerating a regular diet, passing flatus, and urinating well. Patient is discharged home in stable condition on  04/05/24        Newborn Data: Birth date:04/01/2024 Birth time:8:13 AM Gender:Female Living status:Living Apgars:8 ,8  Weight:3830 g    Magnesium Sulfate received: No BMZ received: No Rhophylac:N/A MMR:N/A T-DaP:Given prenatally Flu: Yes RSV Vaccine received: Yes Transfusion:No Immunizations administered: Immunization History  Administered Date(s) Administered    sv, Bivalent, Protein Subunit Rsvpref,pf (Abrysvo ) 03/05/2024   HPV 9-valent 12/01/2019, 02/09/2020, 05/08/2021   Influenza, Mdck, Trivalent,PF 6+ MOS(egg free) 03/02/2024    Influenza-Unspecified 03/03/2014, 02/26/2018, 03/08/2019, 03/08/2021, 03/31/2023   Moderna Sars-Covid-2 Vaccination 01/26/2020, 02/26/2020   Tdap 01/08/2024    Physical exam  Vitals:   04/05/24 1125 04/05/24 1405 04/05/24 1501 04/05/24 1703  BP: 135/76 136/85 134/83 (!) 119/56  Pulse: (!) 103 94 87 (!) 102  Resp: 18 20  20   Temp: 97.8 F (36.6 C)   98.3 F (36.8 C)  TempSrc: Oral   Oral  SpO2: 99% 99%  92%  Weight:      Height:       General: alert, cooperative, and no distress Lochia: appropriate Uterine Fundus: firm Incision: Dressing is clean, dry, and intact DVT Evaluation: No evidence of DVT seen on physical exam. Calf/Ankle edema is present Labs: Lab Results  Component Value Date   WBC 10.5 04/05/2024   HGB 10.2 (L) 04/05/2024   HCT 31.3 (L) 04/05/2024   MCV 82.4 04/05/2024   PLT 348 04/05/2024      Latest Ref Rng & Units 04/05/2024    9:41 AM  CMP  Total Protein 6.5 - 8.1 g/dL 5.3   Total Bilirubin 0.0 - 1.2 mg/dL 0.2   Alkaline Phos 38 - 126 U/L 82   AST 15 - 41 U/L 31   ALT 0 - 44 U/L 31    Edinburgh Score:    04/02/2024    2:29 PM  Edinburgh Postnatal Depression Scale Screening Tool  I have been able to laugh and see the funny side of things. 1  I have looked forward with enjoyment to things. 0  I have blamed myself unnecessarily when things went wrong. 1  I have been anxious or worried for no good reason. 2  I have felt scared or panicky for no good reason. 0  Things have been getting on top of me. 1  I have been so unhappy that I have had difficulty sleeping. 1  I have felt sad or miserable. 1  I have been so unhappy that I have been crying. 0  The thought of harming myself has occurred to me. 0  Edinburgh Postnatal Depression Scale Total 7      After visit meds:  Allergies as of 04/05/2024       Reactions   Cephalexin    Doxycycline     Tape    Trazodone Hives   Zyrtec [cetirizine]    Lidocaine  Rash   Patch        Medication  List     STOP taking these medications    Abrysvo  120 MCG/0.5ML injection Generic drug: RSV bivalent vaccine   ASPIRIN 81 PO   Dexcom G7 Sensor Misc   freestyle lancets   FREESTYLE LITE test strip Generic drug: glucose blood   FreeStyle Lite w/Device Kit   insulin  glargine-yfgn 100 UNIT/ML Pen Commonly known as: Semglee  (yfgn)   Semglee  (yfgn) 100 UNIT/ML Pen Generic drug: insulin  glargine-yfgn       TAKE these medications    cyclobenzaprine  10 MG tablet Commonly known as: FLEXERIL  Take 1 tablet (10 mg total) by mouth 3 (three) times daily as needed for muscle spasms. What changed: when to take this   famotidine  20 MG tablet Commonly known as: PEPCID  Take 1 tablet (20 mg total) by mouth 2 (two) times daily. What changed:  when to take this reasons to take this   FLUoxetine  20 MG capsule Commonly known as: PROZAC  Take 1 capsule (20 mg total) by mouth 2 (two) times daily. What changed:  how much to take when to take this   hydroxychloroquine  200 MG tablet Commonly known as: PLAQUENIL  Take 200 mg by mouth daily. What changed: how much to take   hydrOXYzine  50 MG capsule Commonly known as: VISTARIL  Take 1 capsule (50 mg total) by mouth at bedtime. What changed:  when to take this reasons to take this   ibuprofen  600 MG tablet Commonly known as: ADVIL  Take 1 tablet (600 mg total) by mouth every 6 (six) hours as needed for moderate pain (pain score 4-6) or cramping.   labetalol 200 MG tablet Commonly known as: NORMODYNE Take 2 tablets (400 mg total) by mouth 3 (three) times daily.   levothyroxine  88 MCG tablet Commonly known as: SYNTHROID  Take 1 tablet (88 mcg total) by mouth daily.   lisdexamfetamine 40 MG capsule Commonly known as: Vyvanse  Take 1 capsule (40 mg total) by mouth daily.   nicotine  14 mg/24hr patch Commonly known as: Nicotine  Step 2 Apply 1 patch every day by transdermal route. What changed:  how much to take when to take  this reasons to take this Another medication with the same name was removed. Continue taking this medication, and follow the directions you see here.   nicotine  polacrilex 2 MG gum Commonly known as: NICORETTE  Chew 1 piece of gum (2 mg total) by mouth every 2 (two) hours as needed. What changed:  when to take this reasons to take this   oxyCODONE -acetaminophen  5-325 MG tablet Commonly known as: Percocet Take 1 tablet by mouth every 4 (four) hours as needed for up to 7 days for severe pain (pain score 7-10).   PRENATAL GUMMIES PO Take 1 tablet by mouth daily.   Unisom  SleepTabs 25 MG  tablet Generic drug: doxylamine  (Sleep) Take 25 mg by mouth at bedtime as needed for sleep.         Discharge home in stable condition Infant Feeding: Breast Infant Disposition:home with mother Discharge instruction: per After Visit Summary and Postpartum booklet. Activity: Advance as tolerated. Pelvic rest for 6 weeks.  Diet: carb modified diet and low salt diet Anticipated Birth Control: BTL done PP Postpartum Appointment:6 weeks Additional Postpartum F/U: Postpartum Depression checkup, 2 hour GTT, Incision check 1-2 days , and BP check 1-2 days  Future Appointments: Future Appointments  Date Time Provider Department Center  06/23/2024  2:30 PM Cheryl Waddell HERO, PA-C [facility] None   Follow up Visit:  Follow-up Information     Ob/Gyn, [facility]. Schedule an appointment as soon as possible for a visit.   Why: 1-2 days for BPand incision check and 6 weeks for postpartum visit Contact information: [address] [place] [address] [phone]                     04/05/2024 Ted LELON Solo, DO

## 2024-04-05 NOTE — Progress Notes (Signed)
 Pt reports fatigue otherwise well. She denies dizziness, HA or blurry vision  Her BP stayed in normal range all day  VSS 119-137/56-85  She diuresed well following iv lasix -  Labs were in normal range : ALT 31, AST 31, PLTs 348  She would like discharge home today  Plan: Discharge home today            BP check in office in 1-2 days           PreE precautions given          Rx sent to [facility]

## 2024-04-05 NOTE — Discharge Instructions (Addendum)
 Call office with any concerns [phone]

## 2024-04-05 NOTE — Progress Notes (Addendum)
 Subjective: Postpartum Day 4: Cesarean Delivery Patient reports tolerating PO, + flatus, + BM, and no problems voiding. Lochia mild. Incision pain when getting in/out of bed but otherwise well controlled. Denies HA, SOB, CP or scotomata. She is bonding well with son- breast/bottlefeeding.  C/O increased lower and upper extremity edema overnight Did not notice much improvement with oral lasixs yesterday. She has tolerated increased labetalol dose well this am.   Objective: Vital signs in last 24 hours: Temp:  [98 F (36.7 C)-98.2 F (36.8 C)] 98 F (36.7 C) (11/03 0804) Pulse Rate:  [98-113] 102 (11/03 0804) Resp:  [17-20] 18 (11/03 0804) BP: (129-148)/(72-91) 137/72 (11/03 0804) SpO2:  [98 %-100 %] 100 % (11/03 0804)  Physical Exam:  General: alert, cooperative, and no distress Lochia: appropriate Uterine Fundus: firm Incision: no significant drainage DVT Evaluation: No evidence of DVT seen on physical exam. Calf/Ankle edema is present.  Recent Labs    04/03/24 2016  HGB 10.2*  HCT 31.8*    Assessment/Plan: Status post Cesarean section.  38yo G2 now P1011 on POD #4 s/p pLTC/S and BS Gthn - BP increased overnight ut in normal range this am after increased dose of labetalol - now on 400mg  po TID. Plan to check BP q 3hrs. Will also check preE labs. Will consider discharge to home with close office follow up if BP and labs not concerning for preE. Pt asymptomatic.   Edema in extremities - will give one dose of IV lasix. Discussed possible effect on breastfeeding attempts. Rec compression socks and pumping hands.  GDMA2 - check 2hr gtt at 6 weeks pp S/P circumcision for baby - doing well Routine post op care .  Ted LELON Solo, DO 04/05/2024, 9:17 AM

## 2024-04-05 NOTE — Plan of Care (Signed)
  Problem: Clinical Measurements: Goal: Will remain free from infection Outcome: Progressing Goal: Diagnostic test results will improve Outcome: Progressing Goal: Respiratory complications will improve Outcome: Progressing Goal: Cardiovascular complication will be avoided Outcome: Progressing   Problem: Clinical Measurements: Goal: Diagnostic test results will improve Outcome: Progressing   Problem: Clinical Measurements: Goal: Will remain free from infection Outcome: Progressing Goal: Diagnostic test results will improve Outcome: Progressing Goal: Respiratory complications will improve Outcome: Progressing Goal: Cardiovascular complication will be avoided Outcome: Progressing   Problem: Coping: Goal: Level of anxiety will decrease Outcome: Completed/Met   Problem: Elimination: Goal: Will not experience complications related to bowel motility Outcome: Progressing

## 2024-04-05 NOTE — Plan of Care (Signed)
  Problem: Clinical Measurements: Goal: Will remain free from infection Outcome: Adequate for Discharge Goal: Diagnostic test results will improve Outcome: Adequate for Discharge Goal: Respiratory complications will improve Outcome: Adequate for Discharge Goal: Cardiovascular complication will be avoided Outcome: Adequate for Discharge   Problem: Elimination: Goal: Will not experience complications related to bowel motility Outcome: Adequate for Discharge   Problem: Pain Managment: Goal: General experience of comfort will improve and/or be controlled Outcome: Adequate for Discharge   Problem: Skin Integrity: Goal: Risk for impaired skin integrity will decrease Outcome: Adequate for Discharge   Problem: Self-Concept: Goal: Communication of feelings regarding changes in body function or appearance will improve Outcome: Adequate for Discharge   Problem: Skin Integrity: Goal: Demonstration of wound healing without infection will improve Outcome: Adequate for Discharge   Problem: Life Cycle: Goal: Chance of risk for complications during the postpartum period will decrease Outcome: Adequate for Discharge   Problem: Role Relationship: Goal: Ability to demonstrate positive interaction with newborn will improve Outcome: Adequate for Discharge   Problem: Skin Integrity: Goal: Demonstration of wound healing without infection will improve Outcome: Adequate for Discharge

## 2024-04-06 ENCOUNTER — Other Ambulatory Visit: Payer: Self-pay

## 2024-04-07 ENCOUNTER — Other Ambulatory Visit (HOSPITAL_COMMUNITY): Payer: Self-pay

## 2024-04-07 DIAGNOSIS — F902 Attention-deficit hyperactivity disorder, combined type: Secondary | ICD-10-CM | POA: Diagnosis not present

## 2024-04-07 DIAGNOSIS — F331 Major depressive disorder, recurrent, moderate: Secondary | ICD-10-CM | POA: Diagnosis not present

## 2024-04-07 DIAGNOSIS — G4700 Insomnia, unspecified: Secondary | ICD-10-CM | POA: Diagnosis not present

## 2024-04-07 DIAGNOSIS — F411 Generalized anxiety disorder: Secondary | ICD-10-CM | POA: Diagnosis not present

## 2024-04-07 MED ORDER — LISDEXAMFETAMINE DIMESYLATE 40 MG PO CAPS
40.0000 mg | ORAL_CAPSULE | Freq: Every day | ORAL | 0 refills | Status: DC
  Filled 2024-04-07: qty 30, 30d supply, fill #0

## 2024-04-08 DIAGNOSIS — T8141XA Infection following a procedure, superficial incisional surgical site, initial encounter: Secondary | ICD-10-CM | POA: Diagnosis not present

## 2024-04-08 LAB — SURGICAL PATHOLOGY

## 2024-04-09 ENCOUNTER — Other Ambulatory Visit (HOSPITAL_COMMUNITY): Payer: Self-pay

## 2024-04-09 ENCOUNTER — Other Ambulatory Visit: Payer: Self-pay

## 2024-04-09 ENCOUNTER — Other Ambulatory Visit (HOSPITAL_BASED_OUTPATIENT_CLINIC_OR_DEPARTMENT_OTHER): Payer: Self-pay

## 2024-04-09 MED ORDER — METRONIDAZOLE 500 MG PO TABS
500.0000 mg | ORAL_TABLET | Freq: Two times a day (BID) | ORAL | 0 refills | Status: DC
  Filled 2024-04-09 (×2): qty 28, 14d supply, fill #0

## 2024-04-09 MED ORDER — LEVOTHYROXINE SODIUM 75 MCG PO TABS
75.0000 ug | ORAL_TABLET | Freq: Every day | ORAL | 6 refills | Status: AC
  Filled 2024-04-09 (×2): qty 30, 30d supply, fill #0
  Filled 2024-05-04: qty 30, 30d supply, fill #1
  Filled 2024-06-15: qty 30, 30d supply, fill #2

## 2024-04-09 MED ORDER — CIPROFLOXACIN HCL 750 MG PO TABS
750.0000 mg | ORAL_TABLET | Freq: Two times a day (BID) | ORAL | 0 refills | Status: DC
  Filled 2024-04-09 (×2): qty 28, 14d supply, fill #0

## 2024-04-12 ENCOUNTER — Other Ambulatory Visit (HOSPITAL_COMMUNITY): Payer: Self-pay

## 2024-04-13 ENCOUNTER — Other Ambulatory Visit (HOSPITAL_COMMUNITY): Payer: Self-pay

## 2024-04-13 MED ORDER — SULFAMETHOXAZOLE-TRIMETHOPRIM 800-160 MG PO TABS
1.0000 | ORAL_TABLET | Freq: Two times a day (BID) | ORAL | 0 refills | Status: DC
  Filled 2024-04-13: qty 14, 7d supply, fill #0

## 2024-04-13 MED ORDER — OXYCODONE HCL 5 MG PO TABS
15.0000 mg | ORAL_TABLET | Freq: Two times a day (BID) | ORAL | 0 refills | Status: AC
  Filled 2024-04-13: qty 60, 10d supply, fill #0

## 2024-04-15 ENCOUNTER — Encounter: Attending: Physician Assistant | Admitting: Physician Assistant

## 2024-04-15 DIAGNOSIS — M0579 Rheumatoid arthritis with rheumatoid factor of multiple sites without organ or systems involvement: Secondary | ICD-10-CM | POA: Insufficient documentation

## 2024-04-15 DIAGNOSIS — T8131XA Disruption of external operation (surgical) wound, not elsewhere classified, initial encounter: Secondary | ICD-10-CM | POA: Insufficient documentation

## 2024-04-15 DIAGNOSIS — F17218 Nicotine dependence, cigarettes, with other nicotine-induced disorders: Secondary | ICD-10-CM | POA: Insufficient documentation

## 2024-04-15 DIAGNOSIS — L98435 Non-pressure chronic ulcer of abdomen with muscle involvement without evidence of necrosis: Secondary | ICD-10-CM | POA: Diagnosis not present

## 2024-04-15 DIAGNOSIS — E063 Autoimmune thyroiditis: Secondary | ICD-10-CM | POA: Insufficient documentation

## 2024-04-15 DIAGNOSIS — I10 Essential (primary) hypertension: Secondary | ICD-10-CM | POA: Diagnosis not present

## 2024-04-16 ENCOUNTER — Encounter

## 2024-04-16 DIAGNOSIS — T8131XA Disruption of external operation (surgical) wound, not elsewhere classified, initial encounter: Secondary | ICD-10-CM | POA: Diagnosis not present

## 2024-04-20 ENCOUNTER — Telehealth (HOSPITAL_COMMUNITY): Payer: Self-pay | Admitting: *Deleted

## 2024-04-20 ENCOUNTER — Encounter

## 2024-04-20 ENCOUNTER — Telehealth (HOSPITAL_COMMUNITY): Payer: Self-pay

## 2024-04-20 DIAGNOSIS — T8131XA Disruption of external operation (surgical) wound, not elsewhere classified, initial encounter: Secondary | ICD-10-CM | POA: Diagnosis not present

## 2024-04-20 NOTE — Telephone Encounter (Signed)
 Attempted return call to patient. Left message for patient to return RN call with any questions or concerns. Allean IVAR Carton, RN, 04/20/24, [phone]

## 2024-04-20 NOTE — Telephone Encounter (Signed)
 04/20/2024 1111  Name: Sonia Holden MRN: [id number] DOB: 1986/01/06  Reason for Call:  Transition of Care Hospital Discharge Call  Contact Status: Patient Contact Status:  (Patient states that she is currently at a doctor's appointment and would like a call back at a later time.)  Language assistant needed:          Follow-Up Questions:    Van Postnatal Depression Scale:  In the Past 7 Days:    PHQ2-9 Depression Scale:     Discharge Follow-up:    Post-discharge interventions: NA  Signature  Rosaline Deretha PEAK

## 2024-04-21 ENCOUNTER — Other Ambulatory Visit (HOSPITAL_COMMUNITY): Payer: Self-pay

## 2024-04-21 ENCOUNTER — Other Ambulatory Visit: Payer: Self-pay

## 2024-04-21 MED ORDER — BUSPIRONE HCL 15 MG PO TABS
15.0000 mg | ORAL_TABLET | Freq: Three times a day (TID) | ORAL | 2 refills | Status: AC
  Filled 2024-04-21: qty 90, 30d supply, fill #0

## 2024-04-23 ENCOUNTER — Encounter: Admitting: Physician Assistant

## 2024-04-23 ENCOUNTER — Other Ambulatory Visit (HOSPITAL_COMMUNITY): Payer: Self-pay

## 2024-04-23 ENCOUNTER — Other Ambulatory Visit (HOSPITAL_COMMUNITY): Payer: Self-pay | Admitting: Physician Assistant

## 2024-04-23 DIAGNOSIS — T8131XA Disruption of external operation (surgical) wound, not elsewhere classified, initial encounter: Secondary | ICD-10-CM

## 2024-04-23 MED ORDER — SULFAMETHOXAZOLE-TRIMETHOPRIM 800-160 MG PO TABS
1.0000 | ORAL_TABLET | Freq: Two times a day (BID) | ORAL | 0 refills | Status: AC
  Filled 2024-04-23: qty 60, 30d supply, fill #0

## 2024-04-23 MED ORDER — METRONIDAZOLE 500 MG PO TABS
500.0000 mg | ORAL_TABLET | Freq: Two times a day (BID) | ORAL | 0 refills | Status: AC
  Filled 2024-04-23: qty 28, 14d supply, fill #0

## 2024-04-23 MED ORDER — OXYCODONE HCL 5 MG PO TABS
5.0000 mg | ORAL_TABLET | ORAL | 0 refills | Status: AC | PRN
  Filled 2024-04-23: qty 42, 7d supply, fill #0

## 2024-04-23 MED ORDER — IBUPROFEN 800 MG PO TABS
800.0000 mg | ORAL_TABLET | Freq: Four times a day (QID) | ORAL | 4 refills | Status: AC
  Filled 2024-04-23: qty 28, 7d supply, fill #0

## 2024-04-24 ENCOUNTER — Encounter (HOSPITAL_BASED_OUTPATIENT_CLINIC_OR_DEPARTMENT_OTHER): Payer: Self-pay

## 2024-04-24 ENCOUNTER — Other Ambulatory Visit (HOSPITAL_COMMUNITY): Payer: Self-pay

## 2024-04-24 ENCOUNTER — Ambulatory Visit (HOSPITAL_BASED_OUTPATIENT_CLINIC_OR_DEPARTMENT_OTHER)

## 2024-04-26 ENCOUNTER — Other Ambulatory Visit (HOSPITAL_COMMUNITY): Payer: Self-pay | Admitting: Physician Assistant

## 2024-04-26 ENCOUNTER — Encounter

## 2024-04-26 DIAGNOSIS — T8131XA Disruption of external operation (surgical) wound, not elsewhere classified, initial encounter: Secondary | ICD-10-CM

## 2024-04-27 ENCOUNTER — Ambulatory Visit (HOSPITAL_COMMUNITY)
Admission: RE | Admit: 2024-04-27 | Discharge: 2024-04-27 | Disposition: A | Discharge status: Home / Self Care | Source: Ambulatory Visit | Attending: Physician Assistant | Admitting: Physician Assistant

## 2024-04-27 DIAGNOSIS — T8131XA Disruption of external operation (surgical) wound, not elsewhere classified, initial encounter: Secondary | ICD-10-CM | POA: Diagnosis present

## 2024-04-27 MED ORDER — IOHEXOL 300 MG/ML  SOLN
100.0000 mL | Freq: Once | INTRAMUSCULAR | Status: AC | PRN
  Administered 2024-04-27: 100 mL via INTRAVENOUS

## 2024-04-27 MED ORDER — IOHEXOL 9 MG/ML PO SOLN
1000.0000 mL | ORAL | Status: AC
  Administered 2024-04-27: 1000 mL via ORAL

## 2024-04-27 MED ORDER — SODIUM CHLORIDE (PF) 0.9 % IJ SOLN
INTRAMUSCULAR | Status: AC
  Filled 2024-04-27: qty 50

## 2024-04-28 ENCOUNTER — Encounter: Admitting: Physician Assistant

## 2024-04-28 DIAGNOSIS — T8131XA Disruption of external operation (surgical) wound, not elsewhere classified, initial encounter: Secondary | ICD-10-CM | POA: Diagnosis not present

## 2024-05-03 ENCOUNTER — Encounter: Attending: Physician Assistant

## 2024-05-03 ENCOUNTER — Other Ambulatory Visit: Payer: Self-pay

## 2024-05-03 DIAGNOSIS — Y838 Other surgical procedures as the cause of abnormal reaction of the patient, or of later complication, without mention of misadventure at the time of the procedure: Secondary | ICD-10-CM | POA: Diagnosis not present

## 2024-05-03 DIAGNOSIS — L98435 Non-pressure chronic ulcer of abdomen with muscle involvement without evidence of necrosis: Secondary | ICD-10-CM | POA: Diagnosis not present

## 2024-05-03 DIAGNOSIS — I10 Essential (primary) hypertension: Secondary | ICD-10-CM | POA: Diagnosis not present

## 2024-05-03 DIAGNOSIS — M0579 Rheumatoid arthritis with rheumatoid factor of multiple sites without organ or systems involvement: Secondary | ICD-10-CM | POA: Insufficient documentation

## 2024-05-03 DIAGNOSIS — E063 Autoimmune thyroiditis: Secondary | ICD-10-CM | POA: Insufficient documentation

## 2024-05-03 DIAGNOSIS — F17218 Nicotine dependence, cigarettes, with other nicotine-induced disorders: Secondary | ICD-10-CM | POA: Diagnosis not present

## 2024-05-03 DIAGNOSIS — T8131XA Disruption of external operation (surgical) wound, not elsewhere classified, initial encounter: Secondary | ICD-10-CM | POA: Diagnosis present

## 2024-05-03 MED ORDER — CIPROFLOXACIN HCL 500 MG PO TABS
500.0000 mg | ORAL_TABLET | Freq: Two times a day (BID) | ORAL | 0 refills | Status: AC
  Filled 2024-05-03: qty 28, 14d supply, fill #0

## 2024-05-04 ENCOUNTER — Other Ambulatory Visit: Payer: Self-pay | Admitting: Obstetrics and Gynecology

## 2024-05-04 ENCOUNTER — Other Ambulatory Visit: Payer: Self-pay

## 2024-05-04 ENCOUNTER — Other Ambulatory Visit (HOSPITAL_COMMUNITY): Payer: Self-pay

## 2024-05-04 DIAGNOSIS — L0291 Cutaneous abscess, unspecified: Secondary | ICD-10-CM

## 2024-05-04 MED ORDER — OXYCODONE HCL 5 MG PO TABS
5.0000 mg | ORAL_TABLET | ORAL | 0 refills | Status: AC
  Filled 2024-05-04: qty 30, 7d supply, fill #0

## 2024-05-05 ENCOUNTER — Encounter: Admitting: Physician Assistant

## 2024-05-05 ENCOUNTER — Other Ambulatory Visit (HOSPITAL_COMMUNITY): Payer: Self-pay | Admitting: Obstetrics and Gynecology

## 2024-05-05 ENCOUNTER — Other Ambulatory Visit: Payer: Self-pay

## 2024-05-05 DIAGNOSIS — R140 Abdominal distension (gaseous): Secondary | ICD-10-CM

## 2024-05-05 NOTE — Progress Notes (Unsigned)
 Karalee Wilkie POUR, MD  Baldwin Channing BURNETT Okey Marjorie, MD There is not enough fluid to allow for drain placement.  US  guided aspiration can be considered if we need a culture, can maybe get 1-2 cc out.    Please advise, Dr. Okey.  Signed,  Wilkie LOIS Karalee, MD  Pager: [phone] Clinic: [phone]       Previous Messages    ----- Message ----- From: Baldwin Channing CROME Sent: 05/04/2024   2:22 PM EST To: Channing CROME Baldwin; Taryn F Rigney, RT; Ir Proc* Subject: CT GUIDED SOFT TISSUE FLUID DRAIN BY PERC CA*  Procedure :CT GUIDED SOFT TISSUE FLUID DRAIN BY PERC CATH  Reason :Cutaneous abscess Dx: Cutaneous abscess, unspecified site [L02.91 (ICD-10-CM)]    History :CT ABDOMEN PELVIS W CONTRAST,US  MFM FETAL BPP WO NON STRESS ,US  MFM OB FOLLOW UP   Provider:Ross, Marjorie, MD  Provider contact ;  [phone]

## 2024-05-06 ENCOUNTER — Encounter: Admitting: Physician Assistant

## 2024-05-06 ENCOUNTER — Ambulatory Visit
Admission: RE | Admit: 2024-05-06 | Discharge: 2024-05-06 | Attending: Obstetrics and Gynecology | Admitting: Obstetrics and Gynecology

## 2024-05-06 DIAGNOSIS — R140 Abdominal distension (gaseous): Secondary | ICD-10-CM | POA: Insufficient documentation

## 2024-05-06 MED ORDER — IOHEXOL 300 MG/ML  SOLN
100.0000 mL | Freq: Once | INTRAMUSCULAR | Status: AC | PRN
  Administered 2024-05-06: 100 mL via INTRAVENOUS

## 2024-05-10 ENCOUNTER — Encounter

## 2024-05-10 ENCOUNTER — Other Ambulatory Visit: Payer: Self-pay

## 2024-05-10 DIAGNOSIS — T8131XA Disruption of external operation (surgical) wound, not elsewhere classified, initial encounter: Secondary | ICD-10-CM | POA: Diagnosis not present

## 2024-05-10 MED ORDER — LEVOFLOXACIN 750 MG PO TABS
750.0000 mg | ORAL_TABLET | Freq: Every day | ORAL | 0 refills | Status: AC
  Filled 2024-05-10: qty 14, 14d supply, fill #0

## 2024-05-10 NOTE — Progress Notes (Unsigned)
 Holden Leader, MD  Baldwin Channing BURNETT Karalee, Wilkie POUR, MD  Good morning,  I saw the patient this morning and we are not going to pursue any attempts at getting a sample of fluid for culture at this time. Thank you so much for your help.  Sonia Holden       Previous Messages    ----- Message ----- From: Baldwin Channing CROME Sent: 05/10/2024   9:18 AM EST To: Sonia Okey, MD; Wilkie POUR Karalee, MD Subject: RE: CT GUIDED SOFT TISSUE FLUID DRAIN BY PER*  Good Morning Dr.McCullough and Dr. Okey,   I am just f/u about this pt. I just wanted to know what why you all would like me to go with this pt? I know you said there is not  enough fluid for the drain.Thank you for your time this morning.  Channing ----- Message ----- From: Karalee Wilkie POUR, MD Sent: 05/05/2024   8:50 AM EST To: Sonia Okey, MD; Channing CROME Baldwin Subject: RE: CT GUIDED SOFT TISSUE FLUID DRAIN BY PER*  There is not enough fluid to allow for drain placement.  US  guided aspiration can be considered if we need a culture, can maybe get 1-2 cc out.    Please advise, Dr. Okey.  Signed,  Wilkie LOIS Karalee, MD  Pager: [phone] Clinic: [phone] ----- Message ----- From: Baldwin Channing CROME Sent: 05/04/2024   2:22 PM EST To: Channing CROME Baldwin; Taryn F Rigney, RT; Ir Proc* Subject: CT GUIDED SOFT TISSUE FLUID DRAIN BY PERC CA*  Procedure :CT GUIDED SOFT TISSUE FLUID DRAIN BY PERC CATH  Reason :Cutaneous abscess Dx: Cutaneous abscess, unspecified site [L02.91 (ICD-10-CM)]    History :CT ABDOMEN PELVIS W CONTRAST,US  MFM FETAL BPP WO NON STRESS ,US  MFM OB FOLLOW UP   Provider:Ross, Leader, MD  Provider contact ;  [phone]

## 2024-05-13 ENCOUNTER — Other Ambulatory Visit: Payer: Self-pay

## 2024-05-13 ENCOUNTER — Encounter: Admitting: Internal Medicine

## 2024-05-13 ENCOUNTER — Other Ambulatory Visit (HOSPITAL_COMMUNITY): Payer: Self-pay

## 2024-05-13 MED ORDER — OXYCODONE HCL 5 MG PO TABS
5.0000 mg | ORAL_TABLET | Freq: Three times a day (TID) | ORAL | 0 refills | Status: DC | PRN
  Filled 2024-05-13 (×3): qty 48, 16d supply, fill #0

## 2024-05-13 MED ORDER — GENTAMICIN SULFATE 0.1 % EX CREA
1.0000 | TOPICAL_CREAM | Freq: Every day | CUTANEOUS | 1 refills | Status: AC
  Filled 2024-05-13 – 2024-05-19 (×3): qty 30, 30d supply, fill #0

## 2024-05-14 ENCOUNTER — Other Ambulatory Visit (HOSPITAL_COMMUNITY): Payer: Self-pay

## 2024-05-17 ENCOUNTER — Encounter

## 2024-05-17 DIAGNOSIS — T8131XA Disruption of external operation (surgical) wound, not elsewhere classified, initial encounter: Secondary | ICD-10-CM | POA: Diagnosis not present

## 2024-05-18 ENCOUNTER — Other Ambulatory Visit (HOSPITAL_COMMUNITY): Payer: Self-pay

## 2024-05-18 MED ORDER — LISDEXAMFETAMINE DIMESYLATE 40 MG PO CAPS
40.0000 mg | ORAL_CAPSULE | Freq: Every day | ORAL | 0 refills | Status: DC
  Filled 2024-05-18: qty 30, 30d supply, fill #0

## 2024-05-19 ENCOUNTER — Other Ambulatory Visit (HOSPITAL_COMMUNITY): Payer: Self-pay

## 2024-05-19 ENCOUNTER — Other Ambulatory Visit: Payer: Self-pay

## 2024-05-20 ENCOUNTER — Encounter: Admitting: Internal Medicine

## 2024-05-20 DIAGNOSIS — T8131XA Disruption of external operation (surgical) wound, not elsewhere classified, initial encounter: Secondary | ICD-10-CM | POA: Diagnosis not present

## 2024-05-21 ENCOUNTER — Other Ambulatory Visit (HOSPITAL_COMMUNITY): Payer: Self-pay

## 2024-05-24 ENCOUNTER — Other Ambulatory Visit: Payer: Self-pay

## 2024-05-24 ENCOUNTER — Encounter

## 2024-05-24 DIAGNOSIS — T8131XA Disruption of external operation (surgical) wound, not elsewhere classified, initial encounter: Secondary | ICD-10-CM | POA: Diagnosis not present

## 2024-05-24 MED ORDER — AMOXICILLIN-POT CLAVULANATE 875-125 MG PO TABS
1.0000 | ORAL_TABLET | Freq: Two times a day (BID) | ORAL | 0 refills | Status: AC
  Filled 2024-05-24 (×2): qty 60, 30d supply, fill #0

## 2024-05-26 ENCOUNTER — Other Ambulatory Visit (HOSPITAL_COMMUNITY): Payer: Self-pay

## 2024-05-26 ENCOUNTER — Other Ambulatory Visit (HOSPITAL_BASED_OUTPATIENT_CLINIC_OR_DEPARTMENT_OTHER): Payer: Self-pay

## 2024-05-26 ENCOUNTER — Other Ambulatory Visit: Payer: Self-pay

## 2024-05-26 ENCOUNTER — Encounter

## 2024-05-26 DIAGNOSIS — T8131XA Disruption of external operation (surgical) wound, not elsewhere classified, initial encounter: Secondary | ICD-10-CM | POA: Diagnosis not present

## 2024-05-26 MED ORDER — FLUCONAZOLE 150 MG PO TABS
ORAL_TABLET | ORAL | 1 refills | Status: AC
  Filled 2024-05-26: qty 2, 7d supply, fill #0

## 2024-05-28 ENCOUNTER — Other Ambulatory Visit: Payer: Self-pay | Admitting: Physician Assistant

## 2024-05-28 ENCOUNTER — Ambulatory Visit
Admission: RE | Admit: 2024-05-28 | Discharge: 2024-05-28 | Disposition: A | Discharge status: Home / Self Care | Source: Ambulatory Visit | Attending: Neurology | Admitting: Neurology

## 2024-05-28 ENCOUNTER — Other Ambulatory Visit: Payer: Self-pay | Admitting: Radiology

## 2024-05-28 DIAGNOSIS — R200 Anesthesia of skin: Secondary | ICD-10-CM

## 2024-05-28 DIAGNOSIS — L0231 Cutaneous abscess of buttock: Secondary | ICD-10-CM

## 2024-05-28 DIAGNOSIS — R39198 Other difficulties with micturition: Secondary | ICD-10-CM

## 2024-05-31 ENCOUNTER — Encounter

## 2024-05-31 DIAGNOSIS — T8131XA Disruption of external operation (surgical) wound, not elsewhere classified, initial encounter: Secondary | ICD-10-CM | POA: Diagnosis not present

## 2024-06-01 ENCOUNTER — Ambulatory Visit
Admission: RE | Admit: 2024-06-01 | Discharge: 2024-06-01 | Disposition: A | Discharge status: Home / Self Care | Source: Ambulatory Visit | Attending: Physician Assistant | Admitting: Physician Assistant

## 2024-06-01 ENCOUNTER — Encounter: Payer: Self-pay | Admitting: Radiology

## 2024-06-01 DIAGNOSIS — L0231 Cutaneous abscess of buttock: Secondary | ICD-10-CM | POA: Diagnosis present

## 2024-06-01 MED ORDER — GADOBUTROL 1 MMOL/ML IV SOLN
10.0000 mL | Freq: Once | INTRAVENOUS | Status: AC | PRN
  Administered 2024-06-01: 10 mL via INTRAVENOUS

## 2024-06-02 ENCOUNTER — Encounter: Admitting: Physician Assistant

## 2024-06-02 DIAGNOSIS — T8131XA Disruption of external operation (surgical) wound, not elsewhere classified, initial encounter: Secondary | ICD-10-CM | POA: Diagnosis not present

## 2024-06-08 ENCOUNTER — Other Ambulatory Visit (HOSPITAL_COMMUNITY): Payer: Self-pay

## 2024-06-08 ENCOUNTER — Encounter: Attending: Physician Assistant

## 2024-06-08 DIAGNOSIS — I10 Essential (primary) hypertension: Secondary | ICD-10-CM | POA: Insufficient documentation

## 2024-06-08 DIAGNOSIS — L98435 Non-pressure chronic ulcer of abdomen with muscle involvement without evidence of necrosis: Secondary | ICD-10-CM | POA: Insufficient documentation

## 2024-06-08 DIAGNOSIS — T8131XA Disruption of external operation (surgical) wound, not elsewhere classified, initial encounter: Secondary | ICD-10-CM | POA: Insufficient documentation

## 2024-06-08 DIAGNOSIS — M0579 Rheumatoid arthritis with rheumatoid factor of multiple sites without organ or systems involvement: Secondary | ICD-10-CM | POA: Insufficient documentation

## 2024-06-08 DIAGNOSIS — F17218 Nicotine dependence, cigarettes, with other nicotine-induced disorders: Secondary | ICD-10-CM | POA: Insufficient documentation

## 2024-06-08 DIAGNOSIS — E063 Autoimmune thyroiditis: Secondary | ICD-10-CM | POA: Insufficient documentation

## 2024-06-08 DIAGNOSIS — X58XXXA Exposure to other specified factors, initial encounter: Secondary | ICD-10-CM | POA: Insufficient documentation

## 2024-06-08 MED ORDER — OXYCODONE HCL 5 MG PO TABS
5.0000 mg | ORAL_TABLET | Freq: Two times a day (BID) | ORAL | 0 refills | Status: AC | PRN
  Filled 2024-06-08: qty 32, 16d supply, fill #0
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
## 2024-06-10 ENCOUNTER — Encounter: Admitting: Physician Assistant

## 2024-06-10 ENCOUNTER — Other Ambulatory Visit (HOSPITAL_COMMUNITY): Payer: Self-pay

## 2024-06-10 ENCOUNTER — Other Ambulatory Visit: Payer: Self-pay

## 2024-06-10 DIAGNOSIS — T8131XA Disruption of external operation (surgical) wound, not elsewhere classified, initial encounter: Secondary | ICD-10-CM | POA: Diagnosis not present

## 2024-06-10 MED ORDER — QUETIAPINE FUMARATE 25 MG PO TABS
25.0000 mg | ORAL_TABLET | Freq: Every evening | ORAL | 1 refills | Status: AC | PRN
  Filled 2024-06-10: qty 60, 30d supply, fill #0

## 2024-06-10 MED ORDER — LISDEXAMFETAMINE DIMESYLATE 40 MG PO CAPS
40.0000 mg | ORAL_CAPSULE | Freq: Every day | ORAL | 0 refills | Status: AC
  Filled 2024-06-28: qty 30, 30d supply, fill #0

## 2024-06-15 ENCOUNTER — Other Ambulatory Visit: Payer: Self-pay

## 2024-06-15 ENCOUNTER — Encounter

## 2024-06-15 ENCOUNTER — Other Ambulatory Visit (HOSPITAL_COMMUNITY): Payer: Self-pay

## 2024-06-15 DIAGNOSIS — T8131XA Disruption of external operation (surgical) wound, not elsewhere classified, initial encounter: Secondary | ICD-10-CM | POA: Diagnosis not present

## 2024-06-17 ENCOUNTER — Other Ambulatory Visit: Payer: Self-pay

## 2024-06-17 ENCOUNTER — Other Ambulatory Visit (HOSPITAL_COMMUNITY): Payer: Self-pay

## 2024-06-17 ENCOUNTER — Other Ambulatory Visit: Payer: Self-pay | Admitting: Physician Assistant

## 2024-06-17 ENCOUNTER — Encounter: Admitting: Physician Assistant

## 2024-06-17 DIAGNOSIS — T8131XA Disruption of external operation (surgical) wound, not elsewhere classified, initial encounter: Secondary | ICD-10-CM | POA: Diagnosis not present

## 2024-06-17 MED ORDER — HYDROXYCHLOROQUINE SULFATE 200 MG PO TABS
200.0000 mg | ORAL_TABLET | Freq: Two times a day (BID) | ORAL | 2 refills | Status: AC
  Filled 2024-06-17: qty 40, 28d supply, fill #0

## 2024-06-17 NOTE — Telephone Encounter (Signed)
 Last Fill: 01/16/2024  Eye exam: 01/01/2023 WNL   Labs: 04/05/2024 CBC RBC 3.80 Hemoglobin 10.2 HCT 31.3  04/03/2024 CMP CO2 20 Calcium 8.4 Total Protein 5.2 Albumin 2.2  Next Visit: 06/23/2024  Last Visit: 01/21/2024  IK:Pwqojffjunmb arthritis   Current Dose per office note 01/21/2024: - Plaquenil  200 mg 1 tablet by mouth twice daily Monday through Friday.   Contacted the patient and advised she was due to update her eye exam. Patient states she had eye exam done on 02/19/2024. Contacted [facility] and they are faxing the eye exam over.   Okay to refill Plaquenil ?

## 2024-06-22 ENCOUNTER — Encounter

## 2024-06-23 ENCOUNTER — Ambulatory Visit: Admitting: Physician Assistant

## 2024-06-23 DIAGNOSIS — E559 Vitamin D deficiency, unspecified: Secondary | ICD-10-CM

## 2024-06-23 DIAGNOSIS — M1380 Other specified arthritis, unspecified site: Secondary | ICD-10-CM

## 2024-06-23 DIAGNOSIS — M7918 Myalgia, other site: Secondary | ICD-10-CM

## 2024-06-23 DIAGNOSIS — F17200 Nicotine dependence, unspecified, uncomplicated: Secondary | ICD-10-CM

## 2024-06-23 DIAGNOSIS — Z803 Family history of malignant neoplasm of breast: Secondary | ICD-10-CM

## 2024-06-23 DIAGNOSIS — R7681 Abnormal rheumatoid factor and anti-citrullinated protein antibody without rheumatoid arthritis: Secondary | ICD-10-CM

## 2024-06-23 DIAGNOSIS — K5909 Other constipation: Secondary | ICD-10-CM

## 2024-06-23 DIAGNOSIS — R590 Localized enlarged lymph nodes: Secondary | ICD-10-CM

## 2024-06-23 DIAGNOSIS — E063 Autoimmune thyroiditis: Secondary | ICD-10-CM

## 2024-06-23 DIAGNOSIS — Z79899 Other long term (current) drug therapy: Secondary | ICD-10-CM

## 2024-06-23 DIAGNOSIS — M23611 Other spontaneous disruption of anterior cruciate ligament of right knee: Secondary | ICD-10-CM

## 2024-06-23 DIAGNOSIS — F419 Anxiety disorder, unspecified: Secondary | ICD-10-CM

## 2024-06-23 DIAGNOSIS — E781 Pure hyperglyceridemia: Secondary | ICD-10-CM

## 2024-06-23 DIAGNOSIS — R7689 Other specified abnormal immunological findings in serum: Secondary | ICD-10-CM

## 2024-06-23 DIAGNOSIS — R5383 Other fatigue: Secondary | ICD-10-CM

## 2024-06-23 DIAGNOSIS — M545 Low back pain, unspecified: Secondary | ICD-10-CM

## 2024-06-23 DIAGNOSIS — M1711 Unilateral primary osteoarthritis, right knee: Secondary | ICD-10-CM

## 2024-06-23 DIAGNOSIS — R002 Palpitations: Secondary | ICD-10-CM

## 2024-06-24 ENCOUNTER — Encounter: Admitting: Physician Assistant

## 2024-06-24 DIAGNOSIS — T8131XA Disruption of external operation (surgical) wound, not elsewhere classified, initial encounter: Secondary | ICD-10-CM | POA: Diagnosis not present

## 2024-06-28 ENCOUNTER — Other Ambulatory Visit: Payer: Self-pay | Admitting: Internal Medicine

## 2024-06-28 ENCOUNTER — Other Ambulatory Visit (HOSPITAL_COMMUNITY): Payer: Self-pay

## 2024-06-28 ENCOUNTER — Other Ambulatory Visit: Payer: Self-pay

## 2024-06-28 MED ORDER — CYCLOBENZAPRINE HCL 10 MG PO TABS
10.0000 mg | ORAL_TABLET | Freq: Three times a day (TID) | ORAL | 0 refills | Status: AC | PRN
  Filled 2024-06-28: qty 30, 10d supply, fill #0

## 2024-06-28 NOTE — Telephone Encounter (Signed)
 Refilled: 09/29/2023 Last OV: 06/11/2023 Next OV: not scheduled

## 2024-06-29 ENCOUNTER — Encounter

## 2024-06-29 ENCOUNTER — Other Ambulatory Visit: Payer: Self-pay

## 2024-07-01 ENCOUNTER — Other Ambulatory Visit (HOSPITAL_COMMUNITY): Payer: Self-pay

## 2024-07-01 ENCOUNTER — Encounter: Admitting: Physician Assistant

## 2024-07-01 DIAGNOSIS — T8131XA Disruption of external operation (surgical) wound, not elsewhere classified, initial encounter: Secondary | ICD-10-CM | POA: Diagnosis not present

## 2024-07-01 MED ORDER — FLUCONAZOLE 150 MG PO TABS
150.0000 mg | ORAL_TABLET | ORAL | 0 refills | Status: AC
  Filled 2024-07-01: qty 4, 28d supply, fill #0

## 2024-07-01 MED ORDER — LEVOFLOXACIN 750 MG PO TABS
750.0000 mg | ORAL_TABLET | Freq: Every day | ORAL | 0 refills | Status: AC
  Filled 2024-07-01: qty 30, 30d supply, fill #0

## 2024-07-06 ENCOUNTER — Encounter

## 2024-07-07 ENCOUNTER — Ambulatory Visit: Admitting: Internal Medicine

## 2024-07-07 VITALS — BP 144/86 | HR 107 | Temp 99.3°F | Ht 67.0 in | Wt 244.0 lb

## 2024-07-07 DIAGNOSIS — T8131XD Disruption of external operation (surgical) wound, not elsewhere classified, subsequent encounter: Secondary | ICD-10-CM

## 2024-07-07 DIAGNOSIS — E039 Hypothyroidism, unspecified: Secondary | ICD-10-CM

## 2024-07-07 DIAGNOSIS — Z8632 Personal history of gestational diabetes: Secondary | ICD-10-CM

## 2024-07-07 DIAGNOSIS — T8131XA Disruption of external operation (surgical) wound, not elsewhere classified, initial encounter: Secondary | ICD-10-CM | POA: Insufficient documentation

## 2024-07-07 DIAGNOSIS — D649 Anemia, unspecified: Secondary | ICD-10-CM | POA: Insufficient documentation

## 2024-07-07 LAB — POCT GLYCOSYLATED HEMOGLOBIN (HGB A1C): Hemoglobin A1C: 5.3 % (ref 4.0–5.6)

## 2024-07-07 NOTE — Patient Instructions (Addendum)
" °  VISIT SUMMARY: During your visit, we discussed your ongoing issues with excessive sweating and heat intolerance, which may be related to your thyroid  condition or a possible infection. We also reviewed your rheumatoid arthritis, thyroid  dysfunction, hypertension, and postoperative wound complications. Blood work has been ordered to further investigate these symptoms.  YOUR PLAN: -POSTOPERATIVE WOUND INFECTION WITH DEHISCENCE AFTER CESAREAN SECTION: You have a wound infection following your cesarean section, which has led to the wound reopening. Despite negative culture results, there is still concern for an active infection. Continue taking Levaquin  as prescribed and follow up with the wound care center for further evaluation and management.  -RHEUMATOID ARTHRITIS: Rheumatoid arthritis is an autoimmune condition that causes joint pain and inflammation. Continue taking gabapentin  and tramadol  for pain management.  - Hypothyroidism: the symptoms of sweating and heat intolerance may be related to this condition. Thyroid  function tests have been ordered to assess your current status.  -HYPERTENSION: Hypertension is high blood pressure. Your recent readings have been elevated.  Will continue to monitor this closely.  -ANEMIA: Anemia is a condition where you have a lower than normal number of red blood cells. This was previously noted during your pregnancy, and your current status will be reassessed with upcoming blood work.  INSTRUCTIONS: Please follow up with the wound care center for further evaluation and management of your postoperative wound infection. Continue taking Levaquin  as prescribed. Blood work has been ordered to check your complete blood count, liver, kidney, and thyroid  function. Continue your current medications for rheumatoid arthritis, thyroid  dysfunction, and hypertension as discussed. If you have any new symptoms or concerns, please contact our office.    Contains text generated  by [employer].   "

## 2024-07-07 NOTE — Assessment & Plan Note (Signed)
-   Patient states that after her C-section in October she had recurrent wound infections and wound dehiscence -She has been followed at wound care center for this and [facility] -On exam, patient did have some serosanguineous discharge on her dressing but wound appears to be healing well and no purulent discharge noted -Continue with Levaquin  per her wound care physician -No further workup at this time

## 2024-07-07 NOTE — Progress Notes (Signed)
 "  Acute Office Visit  Subjective:     Patient ID: Sonia Holden, female    DOB: 01-10-1986, 39 y.o.   MRN: [id number]  Chief Complaint  Patient presents with   Acute Visit    Fasting AM sugars 100's  BP readings from 120's to 160's    Discussed the use of AI scribe software for clinical note transcription with the patient, who gave verbal consent to proceed.  History of Present Illness Sonia Holden is a 39 year old female with rheumatoid arthritis and Hashimoto's thyroiditis who presents with persistent sweating and heat intolerance.  Abnormal sweating and heat intolerance - Persistent excessive sweating and significant heat intolerance for several weeks - Feels feverish without documented fever - Initially attributed symptoms to oxycodone  withdrawal, which was discontinued several weeks ago  Hypertension - Elevated blood pressure since delivery - Blood pressure was well-controlled prior to pregnancy, typically in the 110s  Rheumatoid arthritis - Resumed gabapentin  and tramadol  post-pregnancy for symptom management  Thyroid  dysfunction  - On levothyroxine , dose adjusted from 88 mcg during pregnancy to 75 mcg, then she decreased the dose to 50 mcg about four weeks ago - Takes levothyroxine  fasting in the morning, waiting about 40 minutes before eating  Postoperative wound complications - Cesarean section on October 30th complicated by seroma and wound dehiscence - Ongoing wound care with wound vac placement - Purulent drainage from wound without bacterial growth on culture - Completed 12 weeks of antibiotics for wound infections; currently on Levaquin   Glycemic status - History of gestational diabetes, resolved post-pregnancy - Fasting blood sugars at home mostly normal, occasional readings of 100-101  Hematologic: During pregnancy - Mild anemia during pregnancy   Obstetric history - Delivered via C-section on October 30th - Newborn at  home    Review of Systems  Constitutional:  Positive for diaphoresis.  HENT: Negative.    Respiratory: Negative.    Cardiovascular: Negative.   Gastrointestinal: Negative.   Musculoskeletal: Negative.   Neurological: Negative.   Psychiatric/Behavioral: Negative.          Objective:    BP (!) 144/86   Pulse (!) 107   Temp 99.3 F (37.4 C)   Ht 5' 7 (1.702 m)   Wt 244 lb (110.7 kg)   LMP 07/07/2024   SpO2 99%   BMI 38.22 kg/m    Physical Exam Constitutional:      Appearance: Normal appearance.  HENT:     Head: Normocephalic and atraumatic.  Cardiovascular:     Rate and Rhythm: Normal rate and regular rhythm.     Heart sounds: Normal heart sounds.  Pulmonary:     Effort: Pulmonary effort is normal.     Breath sounds: Normal breath sounds. No wheezing, rhonchi or rales.  Abdominal:     General: Bowel sounds are normal. There is no distension.     Palpations: Abdomen is soft.     Tenderness: There is no abdominal tenderness. There is no guarding or rebound.     Comments: Patient noted to have dressing with mild sero-sanguinous discharge on lower abdominal wound.  Under the dressing, the wound appears to be healing well.  No erythema or tenderness noted.  Musculoskeletal:        General: No swelling or tenderness.  Neurological:     Mental Status: She is alert.  Psychiatric:        Mood and Affect: Mood normal.        Behavior: Behavior normal.  Results for orders placed or performed in visit on 07/07/24  POCT glycosylated hemoglobin (Hb A1C)  Result Value Ref Range   Hemoglobin A1C 5.3 4.0 - 5.6 %   HbA1c POC (<> result, manual entry)     HbA1c, POC (prediabetic range)     HbA1c, POC (controlled diabetic range)          Assessment & Plan:   Problem List Items Addressed This Visit       Endocrine   Acquired hypothyroidism - Primary (Chronic)   - Patient states that she was on 88 mcg of Synthroid  during her pregnancy and resumed 75 mcg of  Synthroid  after.  She self decreased her dose to 50 mcg of Synthroid  approximately 4 weeks ago -She does complain of diaphoresis and states her temperature runs mildly higher than her normal (99.3 F here, normally runs around 97 F) -We will recheck her TSH and as well as her free T4 -No further workup at this time      Relevant Orders   Comprehensive metabolic panel with GFR   TSH   T4, free     Other   Anemia   - Patient was noted to be anemic with hemoglobin in the tens during the later part of her pregnancy.  This was normocytic.  Suspect this is dilutional secondary to her pregnancy -Will recheck a CBC today -No further workup at this time      Relevant Orders   CBC with Differential/Platelet   History of gestational diabetes   - Patient has a history of gestational diabetes during her recent pregnancy which was managed with insulin  -States that she was concerned that she has persistent diabetes after giving birth given her episodes of diaphoresis and states that her fasting sugars been mildly elevated in the 100 -101 -A1c today is 5.3 -No further workup at this time      Relevant Orders   Comprehensive metabolic panel with GFR   POCT glycosylated hemoglobin (Hb A1C) (Completed)   Postoperative wound dehiscence   - Patient states that after her C-section in October she had recurrent wound infections and wound dehiscence -She has been followed at wound care center for this and [facility] -On exam, patient did have some serosanguineous discharge on her dressing but wound appears to be healing well and no purulent discharge noted -Continue with Levaquin  per her wound care physician -No further workup at this time       No orders of the defined types were placed in this encounter.   No follow-ups on file.  Sonia Sternberg, MD   "

## 2024-07-07 NOTE — Assessment & Plan Note (Signed)
-   Patient states that she was on 88 mcg of Synthroid  during her pregnancy and resumed 75 mcg of Synthroid  after.  She self decreased her dose to 50 mcg of Synthroid  approximately 4 weeks ago -She does complain of diaphoresis and states her temperature runs mildly higher than her normal (99.3 F here, normally runs around 97 F) -We will recheck her TSH and as well as her free T4 -No further workup at this time

## 2024-07-07 NOTE — Assessment & Plan Note (Signed)
-   Patient has a history of gestational diabetes during her recent pregnancy which was managed with insulin  -States that she was concerned that she has persistent diabetes after giving birth given her episodes of diaphoresis and states that her fasting sugars been mildly elevated in the 100 -101 -A1c today is 5.3 -No further workup at this time

## 2024-07-07 NOTE — Assessment & Plan Note (Signed)
-   Patient was noted to be anemic with hemoglobin in the tens during the later part of her pregnancy.  This was normocytic.  Suspect this is dilutional secondary to her pregnancy -Will recheck a CBC today -No further workup at this time

## 2024-07-08 ENCOUNTER — Encounter: Admitting: Physician Assistant

## 2024-07-08 LAB — CBC WITH DIFFERENTIAL/PLATELET
Basophils Absolute: 0.1 10*3/uL (ref 0.0–0.1)
Basophils Relative: 1 % (ref 0.0–3.0)
Eosinophils Absolute: 0.3 10*3/uL (ref 0.0–0.7)
Eosinophils Relative: 4.1 % (ref 0.0–5.0)
HCT: 42.4 % (ref 36.0–46.0)
Hemoglobin: 13.9 g/dL (ref 12.0–15.0)
Lymphocytes Relative: 27.2 % (ref 12.0–46.0)
Lymphs Abs: 2.3 10*3/uL (ref 0.7–4.0)
MCHC: 32.8 g/dL (ref 30.0–36.0)
MCV: 82.5 fl (ref 78.0–100.0)
Monocytes Absolute: 0.6 10*3/uL (ref 0.1–1.0)
Monocytes Relative: 6.8 % (ref 3.0–12.0)
Neutro Abs: 5.1 10*3/uL (ref 1.4–7.7)
Neutrophils Relative %: 60.9 % (ref 43.0–77.0)
Platelets: 360 10*3/uL (ref 150.0–400.0)
RBC: 5.14 Mil/uL — ABNORMAL HIGH (ref 3.87–5.11)
RDW: 16.7 % — ABNORMAL HIGH (ref 11.5–15.5)
WBC: 8.4 10*3/uL (ref 4.0–10.5)

## 2024-07-08 LAB — COMPREHENSIVE METABOLIC PANEL WITH GFR
ALT: 17 U/L (ref 3–35)
AST: 13 U/L (ref 5–37)
Albumin: 4.3 g/dL (ref 3.5–5.2)
Alkaline Phosphatase: 55 U/L (ref 39–117)
BUN: 14 mg/dL (ref 6–23)
CO2: 29 meq/L (ref 19–32)
Calcium: 9.5 mg/dL (ref 8.4–10.5)
Chloride: 101 meq/L (ref 96–112)
Creatinine, Ser: 0.74 mg/dL (ref 0.40–1.20)
GFR: 101.05 mL/min
Glucose, Bld: 89 mg/dL (ref 70–99)
Potassium: 4.6 meq/L (ref 3.5–5.1)
Sodium: 138 meq/L (ref 135–145)
Total Bilirubin: 0.2 mg/dL (ref 0.2–1.2)
Total Protein: 6.9 g/dL (ref 6.0–8.3)

## 2024-07-08 LAB — TSH: TSH: 3.06 u[IU]/mL (ref 0.35–5.50)

## 2024-07-08 LAB — T4, FREE: Free T4: 0.64 ng/dL (ref 0.60–1.60)

## 2024-07-09 ENCOUNTER — Ambulatory Visit: Payer: Self-pay | Admitting: Internal Medicine

## 2024-07-12 ENCOUNTER — Ambulatory Visit: Admitting: Internal Medicine

## 2024-07-12 ENCOUNTER — Ambulatory Visit

## 2024-07-13 ENCOUNTER — Encounter

## 2024-07-15 ENCOUNTER — Encounter: Admitting: Physician Assistant

## 2024-07-19 ENCOUNTER — Ambulatory Visit: Admitting: Internal Medicine

## 2024-07-20 ENCOUNTER — Encounter

## 2024-07-22 ENCOUNTER — Encounter: Admitting: Physician Assistant

## 2024-07-27 ENCOUNTER — Encounter

## 2024-07-29 ENCOUNTER — Encounter: Admitting: Physician Assistant

## 2024-08-03 ENCOUNTER — Ambulatory Visit: Admitting: Internal Medicine

## 2024-08-09 ENCOUNTER — Ambulatory Visit: Admitting: Internal Medicine

## 2024-08-12 ENCOUNTER — Ambulatory Visit: Admitting: Physician Assistant
